# Patient Record
Sex: Male | Born: 1945 | Race: White | Hispanic: No | Smoking: Current every day smoker
Health system: Southern US, Community
[De-identification: ages and names within clinical notes are randomized; demographics above are authoritative.]

## PROBLEM LIST (undated history)

## (undated) DIAGNOSIS — N529 Male erectile dysfunction, unspecified: Secondary | ICD-10-CM

## (undated) DIAGNOSIS — N471 Phimosis: Secondary | ICD-10-CM

## (undated) DIAGNOSIS — K859 Acute pancreatitis without necrosis or infection, unspecified: Secondary | ICD-10-CM

## (undated) DIAGNOSIS — G4730 Sleep apnea, unspecified: Secondary | ICD-10-CM

## (undated) DIAGNOSIS — E669 Obesity, unspecified: Secondary | ICD-10-CM

## (undated) DIAGNOSIS — T7840XA Allergy, unspecified, initial encounter: Secondary | ICD-10-CM

## (undated) DIAGNOSIS — I2510 Atherosclerotic heart disease of native coronary artery without angina pectoris: Secondary | ICD-10-CM

## (undated) DIAGNOSIS — R350 Frequency of micturition: Secondary | ICD-10-CM

## (undated) DIAGNOSIS — E7800 Pure hypercholesterolemia, unspecified: Secondary | ICD-10-CM

## (undated) DIAGNOSIS — R3915 Urgency of urination: Secondary | ICD-10-CM

## (undated) DIAGNOSIS — M542 Cervicalgia: Secondary | ICD-10-CM

## (undated) DIAGNOSIS — K760 Fatty (change of) liver, not elsewhere classified: Secondary | ICD-10-CM

## (undated) DIAGNOSIS — N189 Chronic kidney disease, unspecified: Secondary | ICD-10-CM

## (undated) DIAGNOSIS — K219 Gastro-esophageal reflux disease without esophagitis: Secondary | ICD-10-CM

## (undated) DIAGNOSIS — C4490 Unspecified malignant neoplasm of skin, unspecified: Secondary | ICD-10-CM

## (undated) DIAGNOSIS — K5790 Diverticulosis of intestine, part unspecified, without perforation or abscess without bleeding: Secondary | ICD-10-CM

## (undated) DIAGNOSIS — I10 Essential (primary) hypertension: Secondary | ICD-10-CM

## (undated) DIAGNOSIS — N411 Chronic prostatitis: Secondary | ICD-10-CM

## (undated) DIAGNOSIS — G8929 Other chronic pain: Secondary | ICD-10-CM

## (undated) DIAGNOSIS — R519 Headache, unspecified: Secondary | ICD-10-CM

## (undated) DIAGNOSIS — M1990 Unspecified osteoarthritis, unspecified site: Secondary | ICD-10-CM

## (undated) DIAGNOSIS — R51 Headache: Secondary | ICD-10-CM

## (undated) DIAGNOSIS — N400 Enlarged prostate without lower urinary tract symptoms: Secondary | ICD-10-CM

## (undated) DIAGNOSIS — D751 Secondary polycythemia: Secondary | ICD-10-CM

## (undated) DIAGNOSIS — I739 Peripheral vascular disease, unspecified: Secondary | ICD-10-CM

## (undated) DIAGNOSIS — Z87891 Personal history of nicotine dependence: Secondary | ICD-10-CM

## (undated) DIAGNOSIS — M359 Systemic involvement of connective tissue, unspecified: Secondary | ICD-10-CM

## (undated) DIAGNOSIS — K259 Gastric ulcer, unspecified as acute or chronic, without hemorrhage or perforation: Secondary | ICD-10-CM

## (undated) DIAGNOSIS — N289 Disorder of kidney and ureter, unspecified: Secondary | ICD-10-CM

## (undated) DIAGNOSIS — E291 Testicular hypofunction: Secondary | ICD-10-CM

## (undated) DIAGNOSIS — J439 Emphysema, unspecified: Secondary | ICD-10-CM

## (undated) DIAGNOSIS — J449 Chronic obstructive pulmonary disease, unspecified: Secondary | ICD-10-CM

## (undated) HISTORY — DX: Obesity, unspecified: E66.9

## (undated) HISTORY — DX: Headache: R51

## (undated) HISTORY — DX: Chronic kidney disease, unspecified: N18.9

## (undated) HISTORY — DX: Benign prostatic hyperplasia without lower urinary tract symptoms: N40.0

## (undated) HISTORY — DX: Fatty (change of) liver, not elsewhere classified: K76.0

## (undated) HISTORY — DX: Unspecified malignant neoplasm of skin, unspecified: C44.90

## (undated) HISTORY — DX: Frequency of micturition: R35.0

## (undated) HISTORY — DX: Sleep apnea, unspecified: G47.30

## (undated) HISTORY — DX: Personal history of nicotine dependence: Z87.891

## (undated) HISTORY — DX: Chronic prostatitis: N41.1

## (undated) HISTORY — DX: Acute pancreatitis without necrosis or infection, unspecified: K85.90

## (undated) HISTORY — DX: Diverticulosis of intestine, part unspecified, without perforation or abscess without bleeding: K57.90

## (undated) HISTORY — PX: UPPER GI ENDOSCOPY: SHX6162

## (undated) HISTORY — DX: Emphysema, unspecified: J43.9

## (undated) HISTORY — DX: Phimosis: N47.1

## (undated) HISTORY — DX: Male erectile dysfunction, unspecified: N52.9

## (undated) HISTORY — DX: Headache, unspecified: R51.9

## (undated) HISTORY — DX: Gastro-esophageal reflux disease without esophagitis: K21.9

## (undated) HISTORY — DX: Secondary polycythemia: D75.1

## (undated) HISTORY — DX: Gastric ulcer, unspecified as acute or chronic, without hemorrhage or perforation: K25.9

## (undated) HISTORY — PX: CERVICAL FUSION: SHX112

## (undated) HISTORY — DX: Testicular hypofunction: E29.1

## (undated) HISTORY — DX: Urgency of urination: R39.15

## (undated) HISTORY — DX: Allergy, unspecified, initial encounter: T78.40XA

---

## 1978-07-14 HISTORY — PX: APPENDECTOMY: SHX54

## 1999-01-07 ENCOUNTER — Encounter: Admission: RE | Admit: 1999-01-07 | Discharge: 1999-04-07 | Payer: Self-pay | Admitting: Anesthesiology

## 2003-06-13 ENCOUNTER — Other Ambulatory Visit: Payer: Self-pay

## 2004-06-20 ENCOUNTER — Ambulatory Visit: Payer: Self-pay | Admitting: Internal Medicine

## 2004-07-14 HISTORY — PX: CHOLECYSTECTOMY: SHX55

## 2005-01-25 ENCOUNTER — Emergency Department: Payer: Self-pay | Admitting: Emergency Medicine

## 2005-02-25 ENCOUNTER — Ambulatory Visit: Payer: Self-pay | Admitting: Internal Medicine

## 2005-07-03 ENCOUNTER — Other Ambulatory Visit: Payer: Self-pay

## 2005-07-03 ENCOUNTER — Emergency Department: Payer: Self-pay | Admitting: Emergency Medicine

## 2006-06-24 ENCOUNTER — Emergency Department: Payer: Self-pay | Admitting: Emergency Medicine

## 2007-06-20 ENCOUNTER — Encounter: Admission: RE | Admit: 2007-06-20 | Discharge: 2007-06-20 | Payer: Self-pay

## 2007-07-27 ENCOUNTER — Ambulatory Visit (HOSPITAL_COMMUNITY): Admission: RE | Admit: 2007-07-27 | Discharge: 2007-07-27 | Payer: Self-pay | Admitting: Neurosurgery

## 2007-09-08 ENCOUNTER — Inpatient Hospital Stay (HOSPITAL_COMMUNITY): Admission: RE | Admit: 2007-09-08 | Discharge: 2007-09-09 | Payer: Self-pay | Admitting: Neurosurgery

## 2009-05-06 ENCOUNTER — Emergency Department: Payer: Self-pay | Admitting: Emergency Medicine

## 2009-05-07 ENCOUNTER — Ambulatory Visit: Payer: Self-pay | Admitting: Orthopedic Surgery

## 2009-07-14 HISTORY — PX: COLONOSCOPY: SHX174

## 2009-09-16 ENCOUNTER — Emergency Department: Payer: Self-pay | Admitting: Unknown Physician Specialty

## 2009-09-24 ENCOUNTER — Ambulatory Visit: Payer: Self-pay | Admitting: Gastroenterology

## 2009-10-30 ENCOUNTER — Emergency Department: Payer: Self-pay | Admitting: Emergency Medicine

## 2010-01-22 ENCOUNTER — Ambulatory Visit: Payer: Self-pay | Admitting: General Practice

## 2010-01-23 ENCOUNTER — Other Ambulatory Visit: Payer: Self-pay | Admitting: Internal Medicine

## 2010-01-24 ENCOUNTER — Ambulatory Visit: Payer: Self-pay | Admitting: Internal Medicine

## 2010-11-26 NOTE — Op Note (Signed)
NAME:  Colton Carter, Colton Carter NO.:  [id number]   MEDICAL RECORD NO.:  [id number]          PATIENT TYPE:  INP   LOCATION:  3035                         FACILITY:  [facility]   PHYSICIAN:  Cristi Loron, M.D.DATE OF BIRTH:  [birth year]   DATE OF PROCEDURE:  09/08/2007  DATE OF DISCHARGE:  09/09/2007                               OPERATIVE REPORT   BRIEF HISTORY:  The patient is a 65 year old white male who has suffered  from neck and arm pain.  He has failed medical management.  He was  worked up with a cervical MRI myelo-CT which demonstrated that the  patient had stenosis, spondylosis, disk degeneration at C3-C4, C4-C5,  and C5-C6.  I discussed the various treatment options with the patient  including surgery.  He is aware of the risks, benefits, and alternatives  of the surgery, so I will proceed with a C3-C4, C4-C5, and C5-C6  anterior cervical diskectomy, fusion, and plating.   PREOPERATIVE DIAGNOSES:  C3-C4, C4-C5, and C5-C6 disk degeneration,  spondylosis, stenosis, cervical radiculopathy/myelopathy, and  cervicalgia.   POSTOPERATIVE DIAGNOSES:  C3-C4, C4-C5, and C5-C6 disk degeneration,  spondylosis, stenosis, cervical radiculopathy/myelopathy, and  cervicalgia.   PROCEDURE:  C3-C4, C4-C5, and C5-C6 extensive anterior cervical  diskectomy/decompression; C3-C4, C4-C5, and C5-C6 anterior arthrodesis  with local morselized autograft bone as well as Actifuse bone graft  extender; insertion of interbody prosthesis at C3-C4, C4-C5, and C5-C6  (Alphatec PEEK interbody prosthesis) anterior cervical plating C3  through C6 with Codman Slim-Loc titanium plate end screws.   SURGEON:  Cristi Loron, M.D.   ASSISTANT:  Danae Orleans. Venetia Maxon, M.D.   ANESTHESIA:  General endotracheal.   ESTIMATED BLOOD LOSS:  100 cc.   SPECIMENS:  None.   DRAINS:  One 7-mm flat Jackson-Pratt drain in the prevertebral space.   COMPLICATIONS:  None.   DESCRIPTION OF PROCEDURE:  The  patient was brought to the operating room  by anesthesia team.  General endotracheal anesthesia was induced.  The  patient remained in supine position.  A roll was placed under the  shoulder and placed the neck in slight extension.  The anterior cervical  region was then prepared with Betadine scrub and Betadine solution.  Sterile drapes were applied.  I then injected the area to be incised  with Marcaine with epinephrine solution, used a scalpel to make a  transverse incision in the patient's left anterior neck.  I used a  Metzenbaum scissors to divide the platysma muscle and then to dissect  medial to sternocleidomastoid muscle, jugular vein, and carotid artery.  I carefully dissected down towards the anterior cervical spine  identifying the esophagus and retracting it medially.  We then used  Kitner swabs to clear the soft tissue from the anterior cervical spine  exposing the intervertebral disk spaces.  We then inserted a bent spinal  needle into the upper exposed intervertebral disk space and obtained  intraoperative radiograph to confirm our location.   We then used electrocautery to detach the medial border of the longus  colli muscle bilaterally from the C3-C4, C4-C5, and  C5-C6 interspaces.  We then inserted the Caspar self-retaining retractor underneath the  longus colli muscle bilaterally to provide exposure.  We began  decompression at C3-C4.  We incised C3-C4 intervertebral disk with 15-  blade scalpel.  We performed a partial intervertebral diskectomy using  pituitary forceps and Carlens curettes.  We inserted distraction screws  at C3-C4 distracted interspace and then brought the operative microscope  into the field, and under visual magnification and illumination, we  completed the microdissection/decompression.  We used a high-speed drill  to decorticate the vertebral endplates at C3-C4, to drill away the  remaining C3-C4 intervertebral disk, to drill away some posterior   spondylosis, and to thin out the posterior longitudinal ligament.  We  then incised the ligament with arachnoid knife and then removed it with  Kerrison punch undercutting the vertebral endplates at C3-C4,  decompressing the thecal sac.  We then performed foraminotomies about  the bilateral C4 nerve roots.  Of note, on the left side as expected,  there was some severe spondylosis.  I performed a foraminotomy about the  left C4 nerve root and removed as much of the spondylosis as I felt  safely could laterally, i.e. I was able to freely pass the black nerve  hook throughout the neural foramen, but there was still some spondylosis  laterally, but as I said I did not think I could remove  any more  spondylosis laterally without putting the vertebral artery in jeopardy,  but I was able to decompress both of the nerves well.   We then repeated this procedure in an analogous fashion at C4-C5, C5-C6  decompressing the C4, C5, and C6 thecal sac and bilateral C5 and C6  nerve roots, completing the decompression.   Having completed the decompression, we now turned our attention to the  arthrodesis.  We used trial spacers and determined to use 6-mm medium  PEEK interbody prosthesis.  We pre-filled this prosthesis with a  combination of local autograft bone.  We obtained during the  decompression as well as Actifuse bone graft extender.  We inserted the  prosthesis, and distracted C3-C4, C4-C5, and C5-C6 interspaces, then  removed distraction screws.  There was good snug fit of the prostheses  at each level.   We now turned our attention to the anterior spinal instrumentation.  We  used a high-speed drill to remove some ventral spondylosis from the  vertebral endplates at C3-C4, C4-C5, and C5-C6 and then plate laid down  flat.  We then selected appropriate-length Codman Slim-Loc anterior  cervical plate and laid along the anterior aspect of the intravertebral  bodies from C3 through C6.  We then  drilled two 14-mm holes at C3, C4,  C5, and C6 and secured the plate to the vertebral bodies by placing two  14-mm self-tapping screws at C3, C4, C5, and C6.  We obtained  intraoperative radiograph.  It demonstrated good position of the plate,  screws, and interbody prostheses at C3-C4, we really could not see C4-  C5, C5-C6 very well on the radiograph because of the patient's  shoulders, but they looked good in vivo.  We, therefore, secured these  screws and plate by locking each cam.   We then irrigated the wound out with bacitracin solution, obtained  stringent hemostasis using bipolar electrocautery.  We placed a 7-mm  flat Jackson-Pratt drain in the prevertebral space and tunneled out  through separate stab wound.  We then removed the retractors and  inspected the esophagus for  any damage, there was none apparent.  We  then reapproximated the patient's platysma muscle with interrupted 3-0  Vicryl suture, subcutaneous tissue with interrupted 3-0 Vicryl suture,  and the skin with Steri-Strips and Benzoin.  The wound was then coated  with  bacitracin ointment.  Sterile dressing was applied.  The drapes were  removed.  The patient was subsequently extubated by the anesthesia team  and transported to post anesthesia care unit in stable condition.  All  sponge, instrument, and needle counts were correct at the end of this  case.      Cristi Loron, M.D.  Electronically Signed     JDJ/MEDQ  D:  09/09/2007  T:  09/10/2007  Job:  [id number]

## 2011-03-08 ENCOUNTER — Emergency Department: Payer: Self-pay | Admitting: Emergency Medicine

## 2011-04-04 LAB — CBC
HCT: 46
RDW: 13.4

## 2011-04-04 LAB — COMPREHENSIVE METABOLIC PANEL
ALT: 35
Albumin: 4.3
Alkaline Phosphatase: 36 — ABNORMAL LOW
Creatinine, Ser: 1.06
GFR calc Af Amer: >60
GFR calc non Af Amer: >60
Glucose, Bld: 66 — ABNORMAL LOW
Sodium: 142

## 2011-07-14 ENCOUNTER — Inpatient Hospital Stay: Payer: Self-pay | Admitting: Internal Medicine

## 2011-07-15 LAB — BASIC METABOLIC PANEL
BUN: 44 mg/dL — ABNORMAL HIGH (ref 7–18)
Co2: 22 mmol/L (ref 21–32)
Creatinine: 2 mg/dL — ABNORMAL HIGH (ref 0.60–1.30)
EGFR (African American): 44 — ABNORMAL LOW
EGFR (Non-African Amer.): 36 — ABNORMAL LOW
Glucose: 208 mg/dL — ABNORMAL HIGH (ref 65–99)
Sodium: 141 mmol/L (ref 136–145)

## 2011-07-15 LAB — CBC WITH DIFFERENTIAL/PLATELET
Eosinophil %: 2.8 %
HCT: 32.3 % — ABNORMAL LOW (ref 40.0–52.0)
HGB: 10.9 g/dL — ABNORMAL LOW (ref 13.0–18.0)
Lymphocyte #: 1.2 10*3/uL (ref 1.0–3.6)
Lymphocyte %: 17.6 %
MCV: 97 fL (ref 80–100)
Monocyte %: 9.7 %
Platelet: 248 10*3/uL (ref 150–440)

## 2011-07-15 LAB — LIPID PANEL
Cholesterol: 92 mg/dL (ref 0–200)
HDL Cholesterol: 20 mg/dL — ABNORMAL LOW (ref 40–60)

## 2011-07-15 LAB — CK TOTAL AND CKMB (NOT AT ARMC)
CK, Total: 66 U/L (ref 35–232)
CK, Total: 58 U/L (ref 35–232)
CK-MB: 1.7 ng/mL (ref 0.5–3.6)
CK-MB: 1.5 ng/mL (ref 0.5–3.6)

## 2011-07-15 LAB — HEMOGLOBIN A1C: Hemoglobin A1C: 8.4 % — ABNORMAL HIGH (ref 4.2–6.3)

## 2011-07-15 LAB — TROPONIN I: Troponin-I: <0.02 ng/mL

## 2011-11-23 ENCOUNTER — Emergency Department: Payer: Self-pay | Admitting: Emergency Medicine

## 2011-11-23 LAB — URINALYSIS, COMPLETE
Blood: NEGATIVE
Ketone: NEGATIVE
Leukocyte Esterase: NEGATIVE
Nitrite: NEGATIVE
Ph: 5 (ref 4.5–8.0)
Specific Gravity: 1.008 (ref 1.003–1.030)
Squamous Epithelial: <1
WBC UR: <1 /HPF (ref 0–5)

## 2011-11-23 LAB — CBC
HGB: 14.5 g/dL (ref 13.0–18.0)
MCH: 31.9 pg (ref 26.0–34.0)
MCHC: 34.5 g/dL (ref 32.0–36.0)
MCV: 93 fL (ref 80–100)
RBC: 4.54 10*6/uL (ref 4.40–5.90)
RDW: 16.2 % — ABNORMAL HIGH (ref 11.5–14.5)

## 2011-11-23 LAB — BASIC METABOLIC PANEL
Anion Gap: 5 — ABNORMAL LOW (ref 7–16)
BUN: 46 mg/dL — ABNORMAL HIGH (ref 7–18)
Chloride: 102 mmol/L (ref 98–107)
Co2: 28 mmol/L (ref 21–32)
EGFR (African American): 42 — ABNORMAL LOW
EGFR (Non-African Amer.): 36 — ABNORMAL LOW
Osmolality: 289 (ref 275–301)

## 2011-11-27 ENCOUNTER — Ambulatory Visit: Payer: Self-pay | Admitting: Urology

## 2011-12-30 ENCOUNTER — Ambulatory Visit: Payer: Self-pay | Admitting: Urology

## 2012-03-29 ENCOUNTER — Emergency Department: Payer: Self-pay | Admitting: Emergency Medicine

## 2012-04-12 ENCOUNTER — Emergency Department (HOSPITAL_COMMUNITY): Payer: Medicare Other

## 2012-04-12 ENCOUNTER — Inpatient Hospital Stay (HOSPITAL_COMMUNITY)
Admission: EM | Admit: 2012-04-12 | Discharge: 2012-04-20 | DRG: 183 | Disposition: A | Discharge status: Home / Self Care | Payer: Medicare Other | Attending: General Surgery | Admitting: General Surgery

## 2012-04-12 ENCOUNTER — Encounter (HOSPITAL_COMMUNITY): Payer: Self-pay | Admitting: *Deleted

## 2012-04-12 DIAGNOSIS — E119 Type 2 diabetes mellitus without complications: Secondary | ICD-10-CM | POA: Diagnosis present

## 2012-04-12 DIAGNOSIS — R0989 Other specified symptoms and signs involving the circulatory and respiratory systems: Secondary | ICD-10-CM | POA: Diagnosis present

## 2012-04-12 DIAGNOSIS — N179 Acute kidney failure, unspecified: Secondary | ICD-10-CM | POA: Diagnosis not present

## 2012-04-12 DIAGNOSIS — D62 Acute posthemorrhagic anemia: Secondary | ICD-10-CM | POA: Diagnosis not present

## 2012-04-12 DIAGNOSIS — J189 Pneumonia, unspecified organism: Secondary | ICD-10-CM | POA: Diagnosis not present

## 2012-04-12 DIAGNOSIS — Z794 Long term (current) use of insulin: Secondary | ICD-10-CM

## 2012-04-12 DIAGNOSIS — S80212A Abrasion, left knee, initial encounter: Secondary | ICD-10-CM | POA: Diagnosis present

## 2012-04-12 DIAGNOSIS — G8929 Other chronic pain: Secondary | ICD-10-CM | POA: Diagnosis present

## 2012-04-12 DIAGNOSIS — M109 Gout, unspecified: Secondary | ICD-10-CM | POA: Diagnosis present

## 2012-04-12 DIAGNOSIS — N183 Chronic kidney disease, stage 3 unspecified: Secondary | ICD-10-CM | POA: Diagnosis present

## 2012-04-12 DIAGNOSIS — F17200 Nicotine dependence, unspecified, uncomplicated: Secondary | ICD-10-CM | POA: Diagnosis present

## 2012-04-12 DIAGNOSIS — J449 Chronic obstructive pulmonary disease, unspecified: Secondary | ICD-10-CM | POA: Diagnosis present

## 2012-04-12 DIAGNOSIS — I2510 Atherosclerotic heart disease of native coronary artery without angina pectoris: Secondary | ICD-10-CM | POA: Diagnosis present

## 2012-04-12 DIAGNOSIS — W11XXXA Fall on and from ladder, initial encounter: Secondary | ICD-10-CM | POA: Diagnosis present

## 2012-04-12 DIAGNOSIS — S2249XA Multiple fractures of ribs, unspecified side, initial encounter for closed fracture: Principal | ICD-10-CM | POA: Diagnosis present

## 2012-04-12 DIAGNOSIS — I129 Hypertensive chronic kidney disease with stage 1 through stage 4 chronic kidney disease, or unspecified chronic kidney disease: Secondary | ICD-10-CM | POA: Diagnosis present

## 2012-04-12 DIAGNOSIS — IMO0002 Reserved for concepts with insufficient information to code with codable children: Secondary | ICD-10-CM | POA: Diagnosis present

## 2012-04-12 DIAGNOSIS — M79609 Pain in unspecified limb: Secondary | ICD-10-CM | POA: Diagnosis present

## 2012-04-12 DIAGNOSIS — J4489 Other specified chronic obstructive pulmonary disease: Secondary | ICD-10-CM | POA: Diagnosis present

## 2012-04-12 DIAGNOSIS — S2241XA Multiple fractures of ribs, right side, initial encounter for closed fracture: Secondary | ICD-10-CM | POA: Diagnosis present

## 2012-04-12 DIAGNOSIS — R0609 Other forms of dyspnea: Secondary | ICD-10-CM | POA: Diagnosis present

## 2012-04-12 DIAGNOSIS — R000 Tachycardia, unspecified: Secondary | ICD-10-CM | POA: Diagnosis not present

## 2012-04-12 DIAGNOSIS — F411 Generalized anxiety disorder: Secondary | ICD-10-CM | POA: Diagnosis present

## 2012-04-12 HISTORY — DX: Essential (primary) hypertension: I10

## 2012-04-12 HISTORY — DX: Atherosclerotic heart disease of native coronary artery without angina pectoris: I25.10

## 2012-04-12 HISTORY — DX: Other chronic pain: G89.29

## 2012-04-12 HISTORY — DX: Cervicalgia: M54.2

## 2012-04-12 HISTORY — DX: Disorder of kidney and ureter, unspecified: N28.9

## 2012-04-12 HISTORY — DX: Pure hypercholesterolemia, unspecified: E78.00

## 2012-04-12 HISTORY — DX: Unspecified osteoarthritis, unspecified site: M19.90

## 2012-04-12 LAB — CBC WITH DIFFERENTIAL/PLATELET
Eosinophils Absolute: 0.2 10*3/uL (ref 0.0–0.7)
Eosinophils Relative: 3 % (ref 0–5)
Hemoglobin: 13.4 g/dL (ref 13.0–17.0)
Lymphocytes Relative: 13 % (ref 12–46)
Neutro Abs: 5.9 10*3/uL (ref 1.7–7.7)
Platelets: 130 10*3/uL — ABNORMAL LOW (ref 150–400)
RDW: 15.7 % — ABNORMAL HIGH (ref 11.5–15.5)
WBC: 7.7 10*3/uL (ref 4.0–10.5)

## 2012-04-12 LAB — CBC
HCT: 40.5 % (ref 39.0–52.0)
MCHC: 33.6 g/dL (ref 30.0–36.0)
Platelets: 114 10*3/uL — ABNORMAL LOW (ref 150–400)
RDW: 15.8 % — ABNORMAL HIGH (ref 11.5–15.5)
WBC: 8.5 10*3/uL (ref 4.0–10.5)

## 2012-04-12 LAB — POCT I-STAT, CHEM 8
Calcium, Ion: 1.1 mmol/L — ABNORMAL LOW (ref 1.13–1.30)
Chloride: 102 mEq/L (ref 96–112)
Creatinine, Ser: 1.8 mg/dL — ABNORMAL HIGH (ref 0.50–1.35)
Potassium: 4.3 mEq/L (ref 3.5–5.1)

## 2012-04-12 LAB — URINALYSIS, ROUTINE W REFLEX MICROSCOPIC
Bilirubin Urine: NEGATIVE
Ketones, ur: NEGATIVE mg/dL
Nitrite: NEGATIVE
Urobilinogen, UA: 1 mg/dL (ref 0.0–1.0)

## 2012-04-12 LAB — CREATININE, SERUM
Creatinine, Ser: 1.69 mg/dL — ABNORMAL HIGH (ref 0.50–1.35)
GFR calc Af Amer: 47 mL/min — ABNORMAL LOW (ref >90)
GFR calc non Af Amer: 41 mL/min — ABNORMAL LOW (ref >90)

## 2012-04-12 MED ORDER — HYDROMORPHONE HCL PF 1 MG/ML IJ SOLN
1.0000 mg | Freq: Once | INTRAMUSCULAR | Status: AC
  Administered 2012-04-12: 1 mg via INTRAVENOUS
  Filled 2012-04-12: qty 1

## 2012-04-12 MED ORDER — ONDANSETRON HCL 4 MG/2ML IJ SOLN
4.0000 mg | Freq: Once | INTRAMUSCULAR | Status: AC
  Administered 2012-04-12: 4 mg via INTRAVENOUS
  Filled 2012-04-12: qty 2

## 2012-04-12 MED ORDER — SODIUM CHLORIDE 0.9 % IV SOLN: 250.0000 mL | INTRAVENOUS | Status: DC | PRN

## 2012-04-12 MED ORDER — LISINOPRIL-HYDROCHLOROTHIAZIDE 20-25 MG PO TABS: 1.0000 | ORAL_TABLET | Freq: Every day | ORAL | Status: DC

## 2012-04-12 MED ORDER — ONDANSETRON HCL 4 MG PO TABS: 4.0000 mg | ORAL_TABLET | Freq: Four times a day (QID) | ORAL | Status: DC | PRN

## 2012-04-12 MED ORDER — BISACODYL 10 MG RE SUPP: 10.0000 mg | Freq: Every day | RECTAL | Status: DC | PRN

## 2012-04-12 MED ORDER — FUROSEMIDE 40 MG PO TABS
40.0000 mg | ORAL_TABLET | Freq: Two times a day (BID) | ORAL | Status: DC
  Administered 2012-04-13 – 2012-04-15 (×4): 40 mg via ORAL
  Filled 2012-04-12 (×10): qty 1

## 2012-04-12 MED ORDER — SODIUM CHLORIDE 0.9 % IJ SOLN
3.0000 mL | Freq: Two times a day (BID) | INTRAMUSCULAR | Status: DC
  Administered 2012-04-13 – 2012-04-19 (×9): 3 mL via INTRAVENOUS

## 2012-04-12 MED ORDER — CYCLOBENZAPRINE HCL 10 MG PO TABS
10.0000 mg | ORAL_TABLET | Freq: Two times a day (BID) | ORAL | Status: DC
  Administered 2012-04-12 – 2012-04-18 (×13): 10 mg via ORAL
  Filled 2012-04-12 (×20): qty 1

## 2012-04-12 MED ORDER — HYDROCODONE-ACETAMINOPHEN 10-325 MG PO TABS
1.0000 | ORAL_TABLET | ORAL | Status: DC | PRN
  Administered 2012-04-12 – 2012-04-14 (×7): 2 via ORAL
  Filled 2012-04-12 (×7): qty 2

## 2012-04-12 MED ORDER — ONDANSETRON HCL 4 MG/2ML IJ SOLN
4.0000 mg | Freq: Four times a day (QID) | INTRAMUSCULAR | Status: DC | PRN
  Administered 2012-04-15: 4 mg via INTRAVENOUS
  Filled 2012-04-12: qty 2

## 2012-04-12 MED ORDER — SODIUM CHLORIDE 0.9 % IJ SOLN: 3.0000 mL | INTRAMUSCULAR | Status: DC | PRN

## 2012-04-12 MED ORDER — MELOXICAM 15 MG PO TABS
15.0000 mg | ORAL_TABLET | Freq: Every day | ORAL | Status: DC
  Administered 2012-04-13 – 2012-04-15 (×3): 15 mg via ORAL
  Filled 2012-04-12 (×3): qty 1

## 2012-04-12 MED ORDER — FEBUXOSTAT 40 MG PO TABS
40.0000 mg | ORAL_TABLET | ORAL | Status: DC
  Administered 2012-04-13 – 2012-04-19 (×4): 40 mg via ORAL
  Filled 2012-04-12 (×4): qty 1

## 2012-04-12 MED ORDER — HYDROCHLOROTHIAZIDE 25 MG PO TABS
25.0000 mg | ORAL_TABLET | Freq: Every day | ORAL | Status: DC
  Administered 2012-04-13 – 2012-04-15 (×3): 25 mg via ORAL
  Filled 2012-04-12 (×4): qty 1

## 2012-04-12 MED ORDER — ENOXAPARIN SODIUM 30 MG/0.3ML ~~LOC~~ SOLN
30.0000 mg | SUBCUTANEOUS | Status: DC
  Administered 2012-04-12 – 2012-04-18 (×7): 30 mg via SUBCUTANEOUS
  Filled 2012-04-12 (×8): qty 0.3

## 2012-04-12 MED ORDER — HYDROMORPHONE HCL PF 1 MG/ML IJ SOLN: 1.0000 mg | INTRAMUSCULAR | Status: DC | PRN

## 2012-04-12 MED ORDER — ONDANSETRON HCL 4 MG/2ML IJ SOLN: 4.0000 mg | Freq: Three times a day (TID) | INTRAMUSCULAR | Status: AC | PRN

## 2012-04-12 MED ORDER — SODIUM CHLORIDE 0.9 % IV SOLN
INTRAVENOUS | Status: DC
  Administered 2012-04-12: 1000 mL via INTRAVENOUS

## 2012-04-12 MED ORDER — INSULIN ASPART 100 UNIT/ML ~~LOC~~ SOLN: 5.0000 [IU] | Freq: Three times a day (TID) | SUBCUTANEOUS | Status: DC

## 2012-04-12 MED ORDER — GABAPENTIN 600 MG PO TABS
600.0000 mg | ORAL_TABLET | Freq: Three times a day (TID) | ORAL | Status: DC
  Administered 2012-04-12 – 2012-04-15 (×10): 600 mg via ORAL
  Filled 2012-04-12 (×16): qty 1

## 2012-04-12 MED ORDER — SIMVASTATIN 40 MG PO TABS
40.0000 mg | ORAL_TABLET | Freq: Every day | ORAL | Status: DC
  Administered 2012-04-13 – 2012-04-19 (×7): 40 mg via ORAL
  Filled 2012-04-12 (×9): qty 1

## 2012-04-12 MED ORDER — MORPHINE SULFATE 4 MG/ML IJ SOLN
4.0000 mg | Freq: Once | INTRAMUSCULAR | Status: AC
  Administered 2012-04-12: 4 mg via INTRAVENOUS
  Filled 2012-04-12: qty 1

## 2012-04-12 MED ORDER — ALPRAZOLAM 0.25 MG PO TABS
0.2500 mg | ORAL_TABLET | Freq: Three times a day (TID) | ORAL | Status: DC
  Administered 2012-04-12 – 2012-04-14 (×7): 0.25 mg via ORAL
  Administered 2012-04-15 – 2012-04-16 (×4): 0.5 mg via ORAL
  Administered 2012-04-16: 0.25 mg via ORAL
  Administered 2012-04-17 – 2012-04-18 (×4): 0.5 mg via ORAL
  Administered 2012-04-18 – 2012-04-19 (×3): 0.25 mg via ORAL
  Administered 2012-04-19: 0.5 mg via ORAL
  Administered 2012-04-19: 0.25 mg via ORAL
  Filled 2012-04-12: qty 2
  Filled 2012-04-12: qty 1
  Filled 2012-04-12: qty 2
  Filled 2012-04-12 (×2): qty 1
  Filled 2012-04-12 (×2): qty 2
  Filled 2012-04-12: qty 1
  Filled 2012-04-12: qty 2
  Filled 2012-04-12: qty 1
  Filled 2012-04-12: qty 2
  Filled 2012-04-12: qty 1
  Filled 2012-04-12: qty 2
  Filled 2012-04-12 (×2): qty 1
  Filled 2012-04-12: qty 2
  Filled 2012-04-12 (×4): qty 1
  Filled 2012-04-12: qty 2

## 2012-04-12 MED ORDER — TIOTROPIUM BROMIDE MONOHYDRATE 18 MCG IN CAPS
18.0000 ug | ORAL_CAPSULE | Freq: Every day | RESPIRATORY_TRACT | Status: DC
  Administered 2012-04-14 – 2012-04-15 (×2): 18 ug via RESPIRATORY_TRACT
  Filled 2012-04-12 (×3): qty 5

## 2012-04-12 MED ORDER — MONTELUKAST SODIUM 10 MG PO TABS
10.0000 mg | ORAL_TABLET | Freq: Every day | ORAL | Status: DC
  Administered 2012-04-13 – 2012-04-19 (×7): 10 mg via ORAL
  Filled 2012-04-12 (×9): qty 1

## 2012-04-12 MED ORDER — TESTOSTERONE CYPIONATE 200 MG/ML IM SOLN: 200.0000 mg | INTRAMUSCULAR | Status: DC

## 2012-04-12 MED ORDER — PANTOPRAZOLE SODIUM 40 MG PO TBEC
40.0000 mg | DELAYED_RELEASE_TABLET | Freq: Every day | ORAL | Status: DC
  Administered 2012-04-13 – 2012-04-19 (×7): 40 mg via ORAL
  Filled 2012-04-12 (×7): qty 1

## 2012-04-12 MED ORDER — INSULIN GLARGINE 100 UNIT/ML ~~LOC~~ SOLN
50.0000 [IU] | Freq: Two times a day (BID) | SUBCUTANEOUS | Status: DC
Start: 2012-04-12 — End: 2012-04-13
  Administered 2012-04-12: 50 [IU] via SUBCUTANEOUS

## 2012-04-12 MED ORDER — ATENOLOL 25 MG PO TABS
25.0000 mg | ORAL_TABLET | Freq: Every day | ORAL | Status: DC
  Administered 2012-04-13 – 2012-04-19 (×5): 25 mg via ORAL
  Filled 2012-04-12 (×9): qty 1

## 2012-04-12 MED ORDER — HYDROMORPHONE HCL PF 1 MG/ML IJ SOLN: 0.5000 mg | INTRAMUSCULAR | Status: DC | PRN

## 2012-04-12 MED ORDER — LISINOPRIL 20 MG PO TABS
20.0000 mg | ORAL_TABLET | Freq: Every day | ORAL | Status: DC
  Administered 2012-04-13 – 2012-04-15 (×2): 20 mg via ORAL
  Filled 2012-04-12 (×4): qty 1

## 2012-04-12 MED ORDER — DOCUSATE SODIUM 100 MG PO CAPS
100.0000 mg | ORAL_CAPSULE | Freq: Two times a day (BID) | ORAL | Status: DC
  Administered 2012-04-12 – 2012-04-18 (×13): 100 mg via ORAL
  Filled 2012-04-12 (×15): qty 1

## 2012-04-12 MED ORDER — IOHEXOL 300 MG/ML  SOLN
80.0000 mL | Freq: Once | INTRAMUSCULAR | Status: AC | PRN
  Administered 2012-04-12: 80 mL via INTRAVENOUS

## 2012-04-12 MED ORDER — FEBUXOSTAT 40 MG PO TABS
80.0000 mg | ORAL_TABLET | ORAL | Status: DC
  Administered 2012-04-14 – 2012-04-18 (×3): 80 mg via ORAL
  Filled 2012-04-12 (×4): qty 2

## 2012-04-12 MED ORDER — FLUTICASONE-SALMETEROL 250-50 MCG/DOSE IN AEPB
1.0000 | INHALATION_SPRAY | Freq: Two times a day (BID) | RESPIRATORY_TRACT | Status: DC
  Administered 2012-04-13 – 2012-04-20 (×13): 1 via RESPIRATORY_TRACT
  Filled 2012-04-12 (×3): qty 14

## 2012-04-12 NOTE — ED Notes (Signed)
Patient transported to CT 

## 2012-04-12 NOTE — ED Provider Notes (Signed)
History     CSN: [id number]  Arrival date & time 04/12/12  1431   First MD Initiated Contact with Patient 04/12/12 1531      Chief Complaint  Patient presents with  . Fall     HPI Pt fell approx 5 feet from ladder then fell again into an empty pool approx 6 feet. Denies LOC. C/o right rib, RUE, RLE, right hip & RUQ pain.  Denies striking his head.  Denies abdominal pain.  Past Medical History  Diagnosis Date  . Coronary artery disease   . Diabetes mellitus   . Hypertension   . Arthritis   . High cholesterol   . Renal disorder   . Chronic neck pain     Past Surgical History  Procedure Date  . Cervical fusion     History reviewed. No pertinent family history.  History  Substance Use Topics  . Smoking status: Current Every Day Smoker -- 0.2 packs/day    Types: Cigarettes  . Smokeless tobacco: Not on file  . Alcohol Use: Yes     2 drinks per week on occasion      Review of Systems All other systems are negative Allergies  Review of patient's allergies indicates no known allergies.  Home Medications   No current outpatient prescriptions on file.  BP 97/59  Pulse 88  Temp 98.5 F (36.9 C) (Oral)  Resp 20  Ht 5\' 7"  (1.702 m)  Wt 205 lb (92.987 kg)  BMI 32.11 kg/m2  SpO2 95%  Physical Exam  Nursing note and vitals reviewed. Constitutional: He is oriented to person, place, and time. He appears well-developed. No distress.  HENT:  Head: Normocephalic and atraumatic.  Eyes: Pupils are equal, round, and reactive to light.  Neck: Normal range of motion.  Cardiovascular: Normal rate and intact distal pulses.   Pulmonary/Chest: No respiratory distress. He exhibits tenderness. He exhibits no crepitus.    Abdominal: Normal appearance. He exhibits no distension.  Musculoskeletal: Normal range of motion.       Left knee: He exhibits no swelling and no laceration.       Back:       Legs: Neurological: He is alert and oriented to person, place, and time.  No cranial nerve deficit.  Skin: Skin is warm and dry. No rash noted.  Psychiatric: He has a normal mood and affect. His behavior is normal.    ED Course  Procedures (including critical care time)     Labs Reviewed  CBC WITH DIFFERENTIAL - Abnormal; Notable for the following:    RBC 4.06 (*)     RDW 15.7 (*)     Platelets 130 (*)     All other components within normal limits  POCT I-STAT, CHEM 8 - Abnormal; Notable for the following:    BUN 30 (*)     Creatinine, Ser 1.80 (*)     Calcium, Ion 1.10 (*)     All other components within normal limits  CBC - Abnormal; Notable for the following:    RBC 4.18 (*)     RDW 15.8 (*)     Platelets 114 (*)  PLATELET COUNT CONFIRMED BY SMEAR   All other components within normal limits  CREATININE, SERUM - Abnormal; Notable for the following:    Creatinine, Ser 1.69 (*)     GFR calc non Af Amer 41 (*)     GFR calc Af Amer 47 (*)     All other components within normal limits  CBC - Abnormal; Notable for the following:    RBC 3.93 (*)     Hemoglobin 12.8 (*)     HCT 37.8 (*)     RDW 15.7 (*)     Platelets 101 (*)  CONSISTENT WITH PREVIOUS RESULT   All other components within normal limits  BASIC METABOLIC PANEL - Abnormal; Notable for the following:    Glucose, Bld 134 (*)     BUN 27 (*)     Creatinine, Ser 1.74 (*)     Calcium 8.3 (*)     GFR calc non Af Amer 39 (*)     GFR calc Af Amer 46 (*)     All other components within normal limits  GLUCOSE, CAPILLARY - Abnormal; Notable for the following:    Glucose-Capillary 180 (*)     All other components within normal limits  GLUCOSE, CAPILLARY - Abnormal; Notable for the following:    Glucose-Capillary 122 (*)     All other components within normal limits  GLUCOSE, CAPILLARY - Abnormal; Notable for the following:    Glucose-Capillary 285 (*)     All other components within normal limits  GLUCOSE, CAPILLARY - Abnormal; Notable for the following:    Glucose-Capillary 199 (*)      All other components within normal limits  GLUCOSE, CAPILLARY - Abnormal; Notable for the following:    Glucose-Capillary 159 (*)     All other components within normal limits  URINALYSIS, ROUTINE W REFLEX MICROSCOPIC  MRSA PCR SCREENING  MRSA PCR SCREENING  CBC  BASIC METABOLIC PANEL   Dg Thoracic Spine 2 View  04/12/2012  *RADIOLOGY REPORT*  Clinical Data: Back pain secondary to a fall.  THORACIC SPINE - 2 VIEW  Comparison: Chest x-ray dated 04/12/2012  Findings: There is no appreciable fracture, subluxation, or bone destruction.  Minimal scoliosis in the mid thoracic spine.  IMPRESSION: No visible acute abnormality.  The thoracic spine will be better seen on chest CT to follow.   Original Report Authenticated By: Gwynn Burly, M.D.    Dg Lumbar Spine Complete  04/12/2012  *RADIOLOGY REPORT*  Clinical Data: Trauma.  Fall on the right side. Back pain  LUMBAR SPINE - COMPLETE 4+ VIEW  Comparison: None.  Findings: No evidence for fracture.  No subluxation. Intervertebral disc spaces are preserved.  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  SI joints are normal bilaterally.  IMPRESSION: No acute bony findings.   Original Report Authenticated By: ERIC A. MANSELL, M.D.    Ct Chest W Contrast  04/12/2012  *RADIOLOGY REPORT*  Clinical Data:  5 feet fall onto concrete injury the right side with rib and hip pain.  Upper abdominal pain.  CT CHEST, ABDOMEN AND PELVIS WITH CONTRAST  Technique:  Multidetector CT imaging of the chest, abdomen and pelvis was performed following the standard protocol during bolus administration of intravenous contrast.  Contrast: 80mL OMNIPAQUE IOHEXOL 300 MG/ML  SOLN  Comparison:   None.  CT CHEST  Findings:  There is no axillary, mediastinal, or hilar lymphadenopathy.  Heart size is normal.  There is no pericardial or pleural effusion.  Lung windows show no evidence for pneumothorax.  There is some dependent atelectasis in the posterior lungs bilaterally.  No focal  airspace consolidation or evidence of pulmonary contusion.  4 mm pulmonary nodule is seen in the left lower lobe on image 29.  Bone windows show acute fractures of the lateral right fourth through eighth ribs.  IMPRESSION: Acute fractures of the  lateral right fourth through eighth ribs. No evidence for segmental rib fracture.  No underlying pneumothorax or pleural effusion.  4 mm left lower lobe pulmonary nodule. If the patient is at high risk for bronchogenic carcinoma, follow-up chest CT at 1 year is recommended.  If the patient is at low risk, no follow-up is needed.  This recommendation follows the consensus statement: Guidelines for Management of Small Pulmonary Nodules Detected on CT Scans:  A Statement from the Fleischner Society as published in Radiology 2005; 237:395-400.  CT ABDOMEN AND PELVIS  Findings:  The liver, spleen, stomach, duodenum, pancreas, adrenal glands, and kidneys are unremarkable.  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  No abdominal aortic aneurysm.  No free fluid or lymphadenopathy in the abdomen.  Imaging through the pelvis shows no free intraperitoneal fluid. The bladder is markedly distended soft tissue band extending from the bladder dome to the umbilicus is consistent with the presence of a ureteral remnant.  Prostate gland is mildly enlarged with central dystrophic calcification.  There is no pelvic sidewall lymphadenopathy.  Bilateral inguinal hernias contain only fat.  Diverticular changes are seen in the sigmoid colon without diverticulitis.  The terminal ileum is unremarkable. The appendix is not visualized, but there is no edema or inflammation in the region of the cecum.  No evidence for fracture in the bony pelvis or lumbar spine.  IMPRESSION: No acute traumatic organ injury in the abdomen or pelvis.  No intraperitoneal free fluid.   Original Report Authenticated By: ERIC A. MANSELL, M.D.    Ct Cervical  Spine Wo Contrast  04/12/2012  *RADIOLOGY REPORT*  Clinical Data: The patient fell 5 feet from ladder onto concrete. Neck pain.  CT CERVICAL SPINE WITHOUT CONTRAST  Technique:  Multidetector CT imaging of the cervical spine was performed. Multiplanar CT image reconstructions were also generated.  Comparison: 07/27/2007  Findings: Imaging was obtained from the skull base through the T2 vertebral body.  The patient is status post extensive cervical fusion with ACDF at C3-4, C4-5, C5-6 with plating.  There is complete loss of disc height at C6-7 and the C6 screw crosses the anterior portion of the C6-7 interspace.  Straightening of the normal cervical lordosis is evident.  No evidence of an acute fracture.  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  The patient is also noted be fused posteriorly at C6-7.  Degenerative disc and endplate disease is seen at C7-T1.  The facets are well-aligned bilaterally with degenerative changes at multiple levels.  The left C3-4 facets are fused.  No evidence for prevertebral soft tissue swelling.  IMPRESSION: Status post ACDF with anterior plating from C3-C6.  The patient is also fused across the C6-7 interspace posteriorly.  No evidence for an acute fracture on the current exam.   Original Report Authenticated By: ERIC A. MANSELL, M.D.    Ct Abdomen Pelvis W Contrast  04/12/2012  *RADIOLOGY REPORT*  Clinical Data:  5 feet fall onto concrete injury the right side with rib and hip pain.  Upper abdominal pain.  CT CHEST, ABDOMEN AND PELVIS WITH CONTRAST  Technique:  Multidetector CT imaging of the chest, abdomen and pelvis was performed following the standard protocol during bolus administration of intravenous contrast.  Contrast: 80mL OMNIPAQUE IOHEXOL 300 MG/ML  SOLN  Comparison:   None.  CT CHEST  Findings:  There is no axillary, mediastinal, or hilar lymphadenopathy.  Heart  size is normal.  There is no pericardial or pleural effusion.  Lung  windows show no evidence for pneumothorax.  There is some dependent atelectasis in the posterior lungs bilaterally.  No focal airspace consolidation or evidence of pulmonary contusion.  4 mm pulmonary nodule is seen in the left lower lobe on image 29.  Bone windows show acute fractures of the lateral right fourth through eighth ribs.  IMPRESSION: Acute fractures of the lateral right fourth through eighth ribs. No evidence for segmental rib fracture.  No underlying pneumothorax or pleural effusion.  4 mm left lower lobe pulmonary nodule. If the patient is at high risk for bronchogenic carcinoma, follow-up chest CT at 1 year is recommended.  If the patient is at low risk, no follow-up is needed.  This recommendation follows the consensus statement: Guidelines for Management of Small Pulmonary Nodules Detected on CT Scans:  A Statement from the Fleischner Society as published in Radiology 2005; 237:395-400.  CT ABDOMEN AND PELVIS  Findings:  The liver, spleen, stomach, duodenum, pancreas, adrenal glands, and kidneys are unremarkable.  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  No abdominal aortic aneurysm.  No free fluid or lymphadenopathy in the abdomen.  Imaging through the pelvis shows no free intraperitoneal fluid. The bladder is markedly distended soft tissue band extending from the bladder dome to the umbilicus is consistent with the presence of a ureteral remnant.  Prostate gland is mildly enlarged with central dystrophic calcification.  There is no pelvic sidewall lymphadenopathy.  Bilateral inguinal hernias contain only fat.  Diverticular changes are seen in the sigmoid colon without diverticulitis.  The terminal ileum is unremarkable. The appendix is not visualized, but there is no edema or inflammation in the region of the cecum.  No evidence for fracture in the bony pelvis or lumbar spine.  IMPRESSION: No acute traumatic organ injury in the  abdomen or pelvis.  No intraperitoneal free fluid.   Original Report Authenticated By: ERIC A. MANSELL, M.D.    Dg Pelvis Portable  04/12/2012  *RADIOLOGY REPORT*  Clinical Data: Post fall, now with diffuse back pain, particularly on the right side.  PORTABLE PELVIS  Comparison: None.  Findings:  Examination is degraded secondary to patient positioning and exclusion of the right lateral aspect of the ilium and the proximal aspect of the right femur.  No definite pelvic fracture.  The regional soft tissues are normal. No radiopaque foreign body.  IMPRESSION: Degraded examination without definite acute finding.   Original Report Authenticated By: Waynard Reeds, M.D.    Dg Chest Portable 1 View  04/12/2012  *RADIOLOGY REPORT*  Clinical Data: Fall.  Pain in back and right side.  PORTABLE CHEST - 1 VIEW  Comparison: None.  Findings: Cardiac leads project over the chest.  Plate and screw fixation of the lower cervical spine is visualized.  Lung volumes are low bilaterally.  Heart and mediastinal contours appear within normal limits for portable technique.  No focal opacities, effusions, or pneumothorax are identified by portable technique.  Question focal buckling of the cortex of the lateral arc of the right sixth rib.  An acute right rib fracture cannot be excluded, but is not definite.  IMPRESSION:  1.  Cannot exclude an acute buckle type fracture of the lateral right eighth rib. A right rib series could be performed for further evaluation, if indicated. 2.  Low lung volumes.  No definite acute cardiopulmonary disease by  portable technique.   Original Report Authenticated By: Britta Mccreedy, M.D.    Dg Knee Ap/lat W/sunrise Left  04/13/2012  *RADIOLOGY REPORT*  Clinical Data: Fall, left knee pain  DG KNEE - 3 VIEWS  Comparison: None.  Findings: No acute fracture, malalignment, or joint effusion.  Very mild early degenerative change with peaking of the tibial spines and early osteophyte formation along the  lateral patella.  IMPRESSION:  1.  No acute fracture, malalignment or joint effusion. 2.  Mild, early degenerative changes with peaking of the tibial spines and trace osteophyte formation at the lateral patellar margin.   Original Report Authenticated By: HEATH      1. Multiple fractures of ribs of right side   2. Fall from ladder       MDM          Nelia Shi, MD 04/14/12 [phone]

## 2012-04-12 NOTE — ED Notes (Signed)
Pt fell approx 5 feet from ladder then fell again into an empty pool approx 6 feet. Denies LOC. C/o right rib, RUE,  RLE, right hip & RUQ pain.

## 2012-04-12 NOTE — ED Provider Notes (Signed)
MSE was initiated and I personally evaluated the patient and placed orders (if any) at  2:54 PM on April 12, 2012.  Patient presents to the emergency room after falling from a ladder onto concrete on the edge of an empty pool. Patient landed on his right side injuring his ribs and hip. He also complains of pain in his right upper abdomen at the inferior portion of his ribs. Patient denies any loss of consciousness. He does have pain in his neck as well. Patient states the pain is severe and increases with movement.  On exam he is noted to have tenderness palpation on the chest wall in the spine. IV pain medications have been ordered. X-rays have been ordered.    Celene Kras, MD 04/12/12 1455

## 2012-04-12 NOTE — ED Notes (Signed)
Paged Dr. Daphine Deutscher to [phone]

## 2012-04-12 NOTE — ED Notes (Signed)
Patient transported to X-ray 

## 2012-04-13 ENCOUNTER — Encounter (HOSPITAL_COMMUNITY): Payer: Self-pay | Admitting: *Deleted

## 2012-04-13 ENCOUNTER — Inpatient Hospital Stay (HOSPITAL_COMMUNITY): Payer: Medicare Other

## 2012-04-13 DIAGNOSIS — D62 Acute posthemorrhagic anemia: Secondary | ICD-10-CM | POA: Diagnosis not present

## 2012-04-13 DIAGNOSIS — S2249XA Multiple fractures of ribs, unspecified side, initial encounter for closed fracture: Secondary | ICD-10-CM

## 2012-04-13 DIAGNOSIS — W11XXXA Fall on and from ladder, initial encounter: Secondary | ICD-10-CM | POA: Diagnosis present

## 2012-04-13 DIAGNOSIS — E118 Type 2 diabetes mellitus with unspecified complications: Secondary | ICD-10-CM | POA: Insufficient documentation

## 2012-04-13 DIAGNOSIS — N289 Disorder of kidney and ureter, unspecified: Secondary | ICD-10-CM

## 2012-04-13 DIAGNOSIS — S80212A Abrasion, left knee, initial encounter: Secondary | ICD-10-CM | POA: Diagnosis present

## 2012-04-13 DIAGNOSIS — E119 Type 2 diabetes mellitus without complications: Secondary | ICD-10-CM

## 2012-04-13 DIAGNOSIS — S2241XA Multiple fractures of ribs, right side, initial encounter for closed fracture: Secondary | ICD-10-CM | POA: Diagnosis present

## 2012-04-13 LAB — CBC
MCH: 32.6 pg (ref 26.0–34.0)
MCHC: 33.9 g/dL (ref 30.0–36.0)
RDW: 15.7 % — ABNORMAL HIGH (ref 11.5–15.5)

## 2012-04-13 LAB — BASIC METABOLIC PANEL
Calcium: 8.3 mg/dL — ABNORMAL LOW (ref 8.4–10.5)
GFR calc Af Amer: 46 mL/min — ABNORMAL LOW (ref >90)
GFR calc non Af Amer: 39 mL/min — ABNORMAL LOW (ref >90)
Glucose, Bld: 134 mg/dL — ABNORMAL HIGH (ref 70–99)
Potassium: 4.2 mEq/L (ref 3.5–5.1)
Sodium: 140 mEq/L (ref 135–145)

## 2012-04-13 LAB — MRSA PCR SCREENING
MRSA by PCR: NEGATIVE
MRSA by PCR: NEGATIVE

## 2012-04-13 LAB — GLUCOSE, CAPILLARY
Glucose-Capillary: 199 mg/dL — ABNORMAL HIGH (ref 70–99)
Glucose-Capillary: 285 mg/dL — ABNORMAL HIGH (ref 70–99)

## 2012-04-13 MED ORDER — IPRATROPIUM-ALBUTEROL 18-103 MCG/ACT IN AERO
2.0000 | INHALATION_SPRAY | Freq: Four times a day (QID) | RESPIRATORY_TRACT | Status: DC | PRN
  Filled 2012-04-13: qty 14.7

## 2012-04-13 MED ORDER — INSULIN GLARGINE 100 UNIT/ML ~~LOC~~ SOLN
50.0000 [IU] | Freq: Two times a day (BID) | SUBCUTANEOUS | Status: DC
  Administered 2012-04-13 (×2): 50 [IU] via SUBCUTANEOUS

## 2012-04-13 MED ORDER — INSULIN ASPART 100 UNIT/ML ~~LOC~~ SOLN
0.0000 [IU] | Freq: Three times a day (TID) | SUBCUTANEOUS | Status: DC
  Administered 2012-04-13: 4 [IU] via SUBCUTANEOUS
  Administered 2012-04-13: 11 [IU] via SUBCUTANEOUS
  Administered 2012-04-14: 7 [IU] via SUBCUTANEOUS
  Administered 2012-04-14: 4 [IU] via SUBCUTANEOUS
  Administered 2012-04-14: 7 [IU] via SUBCUTANEOUS
  Administered 2012-04-15 – 2012-04-16 (×3): 4 [IU] via SUBCUTANEOUS
  Administered 2012-04-19: 3 [IU] via SUBCUTANEOUS
  Administered 2012-04-19: 4 [IU] via SUBCUTANEOUS
  Administered 2012-04-19: 3 [IU] via SUBCUTANEOUS
  Administered 2012-04-20: 4 [IU] via SUBCUTANEOUS

## 2012-04-13 MED ORDER — INSULIN ASPART 100 UNIT/ML ~~LOC~~ SOLN: 0.0000 [IU] | Freq: Every day | SUBCUTANEOUS | Status: DC

## 2012-04-13 NOTE — H&P (Signed)
Colton Carter is an 66 y.o. male.   Chief Complaint: fall with right rib fractures HPI:  Pt is a 66 year old male who was fixing a pool shed and fell off ladder onto the edge of the pool into the empty pool.  He struck his RUE and right lateral chest.  He did not strike his head or pass out.  He states that he "lost his balance," but he denies passing out.  He does complain of a headache, which he attributes to hyperglycemia.  His main complaints are right chest and arm pain, left knee pain, and neck pain.  He had a cervical spine fusion previously, and he states that the pain is like his baseline.  He is having some trouble taking deep breaths.    Past Medical History  Diagnosis Date  . Coronary artery disease   . Diabetes mellitus   . Hypertension   . Arthritis   . High cholesterol   . Renal disorder   . Chronic neck pain     Past Surgical History  Procedure Date  . Cervical fusion     No family history on file. Social History:  reports that he has been smoking.  He does not have any smokeless tobacco history on file. He reports that he drinks alcohol. He reports that he does not use illicit drugs.  Allergies: No Known Allergies  Medications Prior to Admission  Medication Sig Dispense Refill  . ALPRAZolam (XANAX) 0.25 MG tablet Take 0.25-0.5 mg by mouth 3 (three) times daily. Takes 2 tabs in am, 1 at lunch and 1 at bedtime      . atenolol (TENORMIN) 25 MG tablet Take 25 mg by mouth daily.      . cyclobenzaprine (FLEXERIL) 10 MG tablet Take 10 mg by mouth 2 (two) times daily.      . febuxostat (ULORIC) 40 MG tablet Take 40-80 mg by mouth See admin instructions. Take 2 tablets on one day then takes 1 tablet      . Fluticasone-Salmeterol (ADVAIR) 250-50 MCG/DOSE AEPB Inhale 1 puff into the lungs every 12 (twelve) hours.      . furosemide (LASIX) 40 MG tablet Take 40 mg by mouth 2 (two) times daily.      Marland Kitchen gabapentin (NEURONTIN) 600 MG tablet Take 600 mg by mouth 3 (three) times daily.       Marland Kitchen HYDROcodone-acetaminophen (LORTAB) 10-500 MG per tablet Take 1 tablet by mouth every 5 (five) hours as needed.      . insulin aspart (NOVOLOG) 100 UNIT/ML injection Inject 5-15 Units into the skin 3 (three) times daily before meals. SS 100-150 = 5 units 151-200 = 7 units 201-250 = 9 units 251-300 =11 units 301-350 = 13 units 351-400 = 15 units      . insulin glargine (LANTUS) 100 UNIT/ML injection Inject 75 Units into the skin 2 (two) times daily.       Marland Kitchen lisinopril-hydrochlorothiazide (PRINZIDE,ZESTORETIC) 20-25 MG per tablet Take 1 tablet by mouth daily.      . meloxicam (MOBIC) 15 MG tablet Take 15 mg by mouth daily.      . methotrexate 1 G injection Inject into the vein every 7 (seven) days. 25mg /ml. For arthritis      . montelukast (SINGULAIR) 10 MG tablet Take 10 mg by mouth daily.      Marland Kitchen omeprazole (PRILOSEC) 20 MG capsule Take 20 mg by mouth daily.      . simvastatin (ZOCOR) 40 MG tablet  Take 40 mg by mouth daily.      Marland Kitchen testosterone cypionate (DEPOTESTOTERONE CYPIONATE) 200 MG/ML injection Inject 200 mg into the muscle every 14 (fourteen) days.      Marland Kitchen tiotropium (SPIRIVA) 18 MCG inhalation capsule Place 18 mcg into inhaler and inhale daily.        Results for orders placed during the hospital encounter of 04/12/12 (from the past 48 hour(s))  CBC WITH DIFFERENTIAL     Status: Abnormal   Collection Time   04/12/12  2:51 PM      Component Value Range Comment   WBC 7.7  4.0 - 10.5 K/uL    RBC 4.06 (*) 4.22 - 5.81 MIL/uL    Hemoglobin 13.4  13.0 - 17.0 g/dL    HCT 16.1  09.6 - 04.5 %    MCV 96.3  78.0 - 100.0 fL    MCH 33.0  26.0 - 34.0 pg    MCHC 34.3  30.0 - 36.0 g/dL    RDW 40.9 (*) 81.1 - 15.5 %    Platelets 130 (*) 150 - 400 K/uL    Neutrophils Relative 76  43 - 77 %    Neutro Abs 5.9  1.7 - 7.7 K/uL    Lymphocytes Relative 13  12 - 46 %    Lymphs Abs 1.0  0.7 - 4.0 K/uL    Monocytes Relative 7  3 - 12 %    Monocytes Absolute 0.5  0.1 - 1.0 K/uL    Eosinophils  Relative 3  0 - 5 %    Eosinophils Absolute 0.2  0.0 - 0.7 K/uL    Basophils Relative 1  0 - 1 %    Basophils Absolute 0.0  0.0 - 0.1 K/uL   POCT I-STAT, CHEM 8     Status: Abnormal   Collection Time   04/12/12  3:08 PM      Component Value Range Comment   Sodium 144  135 - 145 mEq/L    Potassium 4.3  3.5 - 5.1 mEq/L    Chloride 102  96 - 112 mEq/L    BUN 30 (*) 6 - 23 mg/dL    Creatinine, Ser 9.14 (*) 0.50 - 1.35 mg/dL    Glucose, Bld 87  70 - 99 mg/dL    Calcium, Ion 7.82 (*) 1.13 - 1.30 mmol/L    TCO2 28  0 - 100 mmol/L    Hemoglobin 13.3  13.0 - 17.0 g/dL    HCT 95.6  21.3 - 08.6 %   URINALYSIS, ROUTINE W REFLEX MICROSCOPIC     Status: Normal   Collection Time   04/12/12  6:27 PM      Component Value Range Comment   Color, Urine YELLOW  YELLOW    APPearance CLEAR  CLEAR    Specific Gravity, Urine 1.014  1.005 - 1.030    pH 5.5  5.0 - 8.0    Glucose, UA NEGATIVE  NEGATIVE mg/dL    Hgb urine dipstick NEGATIVE  NEGATIVE    Bilirubin Urine NEGATIVE  NEGATIVE    Ketones, ur NEGATIVE  NEGATIVE mg/dL    Protein, ur NEGATIVE  NEGATIVE mg/dL    Urobilinogen, UA 1.0  0.0 - 1.0 mg/dL    Nitrite NEGATIVE  NEGATIVE    Leukocytes, UA NEGATIVE  NEGATIVE MICROSCOPIC NOT DONE ON URINES WITH NEGATIVE PROTEIN, BLOOD, LEUKOCYTES, NITRITE, OR GLUCOSE <1000 mg/dL.  CBC     Status: Abnormal   Collection Time   04/12/12  9:56 PM      Component Value Range Comment   WBC 8.5  4.0 - 10.5 K/uL    RBC 4.18 (*) 4.22 - 5.81 MIL/uL    Hemoglobin 13.6  13.0 - 17.0 g/dL    HCT 16.1  09.6 - 04.5 %    MCV 96.9  78.0 - 100.0 fL    MCH 32.5  26.0 - 34.0 pg    MCHC 33.6  30.0 - 36.0 g/dL    RDW 40.9 (*) 81.1 - 15.5 %    Platelets 114 (*) 150 - 400 K/uL PLATELET COUNT CONFIRMED BY SMEAR  CREATININE, SERUM     Status: Abnormal   Collection Time   04/12/12  9:56 PM      Component Value Range Comment   Creatinine, Ser 1.69 (*) 0.50 - 1.35 mg/dL    GFR calc non Af Amer 41 (*) >90 mL/min    GFR calc Af Amer  47 (*) >90 mL/min    Dg Thoracic Spine 2 View  04/12/2012  *RADIOLOGY REPORT*  Clinical Data: Back pain secondary to a fall.  THORACIC SPINE - 2 VIEW  Comparison: Chest x-ray dated 04/12/2012  Findings: There is no appreciable fracture, subluxation, or bone destruction.  Minimal scoliosis in the mid thoracic spine.  IMPRESSION: No visible acute abnormality.  The thoracic spine will be better seen on chest CT to follow.   Original Report Authenticated By: Gwynn Burly, M.D.    Dg Lumbar Spine Complete  04/12/2012  *RADIOLOGY REPORT*  Clinical Data: Trauma.  Fall on the right side. Back pain  LUMBAR SPINE - COMPLETE 4+ VIEW  Comparison: None.  Findings: No evidence for fracture.  No subluxation. Intervertebral disc spaces are preserved.  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  SI joints are normal bilaterally.  IMPRESSION: No acute bony findings.   Original Report Authenticated By: ERIC A. MANSELL, M.D.    Ct Chest/Abd/pelvis W Contrast  04/12/2012  *RADIOLOGY REPORT*  Clinical Data:  5 feet fall onto concrete injury the right side with rib and hip pain.  Upper abdominal pain.  CT CHEST, ABDOMEN AND PELVIS WITH CONTRAST  Technique:  Multidetector CT imaging of the chest, abdomen and pelvis was performed following the standard protocol during bolus administration of intravenous contrast.  Contrast: 80mL OMNIPAQUE IOHEXOL 300 MG/ML  SOLN  Comparison:   None.  CT CHEST  Findings:  There is no axillary, mediastinal, or hilar lymphadenopathy.  Heart size is normal.  There is no pericardial or pleural effusion.  Lung windows show no evidence for pneumothorax.  There is some dependent atelectasis in the posterior lungs bilaterally.  No focal airspace consolidation or evidence of pulmonary contusion.  4 mm pulmonary nodule is seen in the left lower lobe on image 29.  Bone windows show acute fractures of the lateral right fourth through eighth ribs.  IMPRESSION: Acute fractures of the lateral right fourth  through eighth ribs. No evidence for segmental rib fracture.  No underlying pneumothorax or pleural effusion.  4 mm left lower lobe pulmonary nodule. If the patient is at high risk for bronchogenic carcinoma, follow-up chest CT at 1 year is recommended.  If the patient is at low risk, no follow-up is needed.  This recommendation follows the consensus statement: Guidelines for Management of Small Pulmonary Nodules Detected on CT Scans:  A Statement from the Fleischner Society as published in Radiology 2005; 237:395-400.  CT ABDOMEN AND PELVIS  Findings:  The liver, spleen, stomach, duodenum, pancreas,  adrenal glands, and kidneys are unremarkable.  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  No abdominal aortic aneurysm.  No free fluid or lymphadenopathy in the abdomen.  Imaging through the pelvis shows no free intraperitoneal fluid. The bladder is markedly distended soft tissue band extending from the bladder dome to the umbilicus is consistent with the presence of a ureteral remnant.  Prostate gland is mildly enlarged with central dystrophic calcification.  There is no pelvic sidewall lymphadenopathy.  Bilateral inguinal hernias contain only fat.  Diverticular changes are seen in the sigmoid colon without diverticulitis.  The terminal ileum is unremarkable. The appendix is not visualized, but there is no edema or inflammation in the region of the cecum.  No evidence for fracture in the bony pelvis or lumbar spine.  IMPRESSION: No acute traumatic organ injury in the abdomen or pelvis.  No intraperitoneal free fluid.   Original Report Authenticated By: ERIC A. MANSELL, M.D.    Ct Cervical Spine Wo Contrast  04/12/2012  *RADIOLOGY REPORT*  Clinical Data: The patient fell 5 feet from ladder onto concrete. Neck pain.  CT CERVICAL SPINE WITHOUT CONTRAST  Technique:  Multidetector CT imaging of the cervical spine was performed. Multiplanar CT image  reconstructions were also generated.  Comparison: 07/27/2007  Findings: Imaging was obtained from the skull base through the T2 vertebral body.  The patient is status post extensive cervical fusion with ACDF at C3-4, C4-5, C5-6 with plating.  There is complete loss of disc height at C6-7 and the C6 screw crosses the anterior portion of the C6-7 interspace.  Straightening of the normal cervical lordosis is evident.  No evidence of an acute fracture.  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  The patient is also noted be fused posteriorly at C6-7.  Degenerative disc and endplate disease is seen at C7-T1.  The facets are well-aligned bilaterally with degenerative changes at multiple levels.  The left C3-4 facets are fused.  No evidence for prevertebral soft tissue swelling.  IMPRESSION: Status post ACDF with anterior plating from C3-C6.  The patient is also fused across the C6-7 interspace posteriorly.  No evidence for an acute fracture on the current exam.   Original Report Authenticated By: ERIC A. MANSELL, M.D.   Dg Pelvis Portable  04/12/2012  *RADIOLOGY REPORT*  Clinical Data: Post fall, now with diffuse back pain, particularly on the right side.  PORTABLE PELVIS  Comparison: None.  Findings:  Examination is degraded secondary to patient positioning and exclusion of the right lateral aspect of the ilium and the proximal aspect of the right femur.  No definite pelvic fracture.  The regional soft tissues are normal. No radiopaque foreign body.  IMPRESSION: Degraded examination without definite acute finding.   Original Report Authenticated By: Waynard Reeds, M.D.    Dg Chest Portable 1 View  04/12/2012  *RADIOLOGY REPORT*  Clinical Data: Fall.  Pain in back and right side.  PORTABLE CHEST - 1 VIEW  Comparison: None.  Findings: Cardiac leads project over the chest.  Plate and screw fixation of the lower cervical spine is visualized.  Lung volumes are low bilaterally.  Heart  and mediastinal contours appear within normal limits for portable technique.  No focal opacities, effusions, or pneumothorax are identified by portable technique.  Question focal buckling of the cortex of the lateral arc of the right sixth rib.  An acute right rib  fracture cannot be excluded, but is not definite.  IMPRESSION:  1.  Cannot exclude an acute buckle type fracture of the lateral right eighth rib. A right rib series could be performed for further evaluation, if indicated. 2.  Low lung volumes.  No definite acute cardiopulmonary disease by portable technique.   Original Report Authenticated By: Britta Mccreedy, M.D.     Review of Systems  Constitutional: Negative.   Eyes: Negative.   Respiratory: Positive for shortness of breath.   Cardiovascular: Positive for chest pain. Negative for palpitations.  Gastrointestinal: Negative.   Genitourinary: Negative.   Musculoskeletal: Negative.   Skin: Negative.   Neurological: Positive for headaches.  Endo/Heme/Allergies: Negative.   Psychiatric/Behavioral: Negative.     Blood pressure 176/93, pulse 74, temperature 97.9 F (36.6 C), temperature source Oral, resp. rate 18, height 5\' 7"  (1.702 m), weight 205 lb (92.987 kg), SpO2 97.00%. Physical Exam   Assessment/Plan Fall  Rib fractures - pain control, pulmonary toilet.  Left knee pain - get plain film  Acute renal insufficiency - hydrate.  DM - lantus and SSI  HTN - atenolol, lisinopril, HCTZ, furosemide.  Gout - uloric  Chronic pain - flexeril, norco, mobic.    COPD - advair/singulair.    Garan Frappier 04/13/2012, 1:17 AM

## 2012-04-13 NOTE — Clinical Social Work Psychosocial (Signed)
     Clinical Social Work Department BRIEF PSYCHOSOCIAL ASSESSMENT 04/13/2012  Patient:  Colton Carter, Colton Carter     Account Number:  [id number]     Admit date:  04/12/2012  Clinical Social Worker:  Pearson Forster  Date/Time:  04/13/2012 04:30 PM  Referred by:  Physician  Date Referred:  04/13/2012 Referred for  Psychosocial assessment   Other Referral:   Interview type:  Patient Other interview type:   Patient girlfriend of 9 years at bedside    PSYCHOSOCIAL DATA Living Status:  SIGNIFICANT OTHER Admitted from facility:   Level of care:   Primary support name:  Huel Cote  [phone] Primary support relationship to patient:  PARTNER Degree of support available:   Strong    CURRENT CONCERNS Current Concerns  None Noted   Other Concerns:    SOCIAL WORK ASSESSMENT / PLAN Clinical Social Worker met with patient and patient girlfriend at bedside to offer support and discuss patient plans at discharge.  Patient states that he was up on a ladder and lost his balance falling several feet and landing in an empty pool.  He currently lives with his girlfriend of 9 years and his 43 year old granddaughter who he provides for.  Patient would like to return home with his family once medically ready.  Patient girlfriend states that she can be home 24 hours a day to assist patient as needed.    Clinical Social Worker inquired about current substance use.  Patient states that he drinks 1-2 a month and just socially.  Patient does not express concern on his current use and understands the consequences associated with alcohol and medication.  SBIRT complete.  No resources needed at this time.    Clinical Social Worker signing off at this time.  No further social work needs identified at this time.  Please reconsult if needs arise.   Assessment/plan status:  No Further Intervention Required Other assessment/ plan:   Information/referral to community resources:   Patient and patient  girlfriend refused resources at this time.  They seem to be well connected in the community that they live.    PATIENTS/FAMILYS RESPONSE TO PLAN OF CARE: Patient alert and oriented x3 sitting on the edge of the bed.  Patient was very forthcoming with information and understanding in social work role for his hospitalization. Patient plans to return home with his family who will provide transportation at discharge.  Patient and patient girlfriend expressed their appreciation for CSW support and concern.

## 2012-04-13 NOTE — Progress Notes (Signed)
UR completed 

## 2012-04-13 NOTE — Progress Notes (Signed)
Patient ID: Colton Carter, male   DOB: 1946-07-08, 66 y.o.   MRN: [id number]    Subjective: Pain right ribs  Objective: Vital signs in last 24 hours: Temp:  [97.8 F (36.6 C)-98.7 F (37.1 C)] 98.5 F (36.9 C) (10/01 0435) Pulse Rate:  [80-105] 95  (10/01 0700) Resp:  [13-23] 13  (10/01 0700) BP: (91-123)/(51-78) 107/54 mmHg (10/01 0700) SpO2:  [85 %-100 %] 91 % (10/01 0700) Weight:  [92.987 kg (205 lb)] 92.987 kg (205 lb) (09/30 1434) Last BM Date: 04/12/12  Intake/Output from previous day: 09/30 0701 - 10/01 0700 In: -  Out: 1030 [Urine:1030] Intake/Output this shift:    General appearance: cooperative Resp: clear to auscultation bilaterally Chest wall: right sided chest wall tenderness Cardio: regular rate and rhythm GI: soft, NT, ND, +BS Extremities: abrasion and tenderness L knee Neuro: arouses easily and F/C  Lab Results: CBC   Basename 04/13/12 0645 04/12/12 2156  WBC 6.5 8.5  HGB 12.8* 13.6  HCT 37.8* 40.5  PLT 101* 114*   BMET  Basename 04/13/12 0645 04/12/12 2156 04/12/12 1508  NA 140 -- 144  K 4.2 -- 4.3  CL 100 -- 102  CO2 31 -- --  GLUCOSE 134* -- 87  BUN 27* -- 30*  CREATININE 1.74* 1.69* --  CALCIUM 8.3* -- --   PT/INR No results found for this basename: LABPROT:2,INR:2 in the last 72 hours ABG No results found for this basename: PHART:2,PCO2:2,PO2:2,HCO3:2 in the last 72 hours  Studies/Results: Dg Thoracic Spine 2 View  04/12/2012  *RADIOLOGY REPORT*  Clinical Data: Back pain secondary to a fall.  THORACIC SPINE - 2 VIEW  Comparison: Chest x-ray dated 04/12/2012  Findings: There is no appreciable fracture, subluxation, or bone destruction.  Minimal scoliosis in the mid thoracic spine.  IMPRESSION: No visible acute abnormality.  The thoracic spine will be better seen on chest CT to follow.   Original Report Authenticated By: Gwynn Burly, M.D.    Dg Lumbar Spine Complete  04/12/2012  *RADIOLOGY REPORT*  Clinical Data: Trauma.  Fall on the  right side. Back pain  LUMBAR SPINE - COMPLETE 4+ VIEW  Comparison: None.  Findings: No evidence for fracture.  No subluxation. Intervertebral disc spaces are preserved.  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  SI joints are normal bilaterally.  IMPRESSION: No acute bony findings.   Original Report Authenticated By: ERIC A. MANSELL, M.D.    Ct Chest W Contrast  04/12/2012  *RADIOLOGY REPORT*  Clinical Data:  5 feet fall onto concrete injury the right side with rib and hip pain.  Upper abdominal pain.  CT CHEST, ABDOMEN AND PELVIS WITH CONTRAST  Technique:  Multidetector CT imaging of the chest, abdomen and pelvis was performed following the standard protocol during bolus administration of intravenous contrast.  Contrast: 80mL OMNIPAQUE IOHEXOL 300 MG/ML  SOLN  Comparison:   None.  CT CHEST  Findings:  There is no axillary, mediastinal, or hilar lymphadenopathy.  Heart size is normal.  There is no pericardial or pleural effusion.  Lung windows show no evidence for pneumothorax.  There is some dependent atelectasis in the posterior lungs bilaterally.  No focal airspace consolidation or evidence of pulmonary contusion.  4 mm pulmonary nodule is seen in the left lower lobe on image 29.  Bone windows show acute fractures of the lateral right fourth through eighth ribs.  IMPRESSION: Acute fractures of the lateral right fourth through eighth ribs. No evidence for segmental rib fracture.  No  underlying pneumothorax or pleural effusion.  4 mm left lower lobe pulmonary nodule. If the patient is at high risk for bronchogenic carcinoma, follow-up chest CT at 1 year is recommended.  If the patient is at low risk, no follow-up is needed.  This recommendation follows the consensus statement: Guidelines for Management of Small Pulmonary Nodules Detected on CT Scans:  A Statement from the Fleischner Society as published in Radiology 2005; 237:395-400.  CT ABDOMEN AND PELVIS  Findings:  The liver, spleen, stomach, duodenum,  pancreas, adrenal glands, and kidneys are unremarkable.  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  No abdominal aortic aneurysm.  No free fluid or lymphadenopathy in the abdomen.  Imaging through the pelvis shows no free intraperitoneal fluid. The bladder is markedly distended soft tissue band extending from the bladder dome to the umbilicus is consistent with the presence of a ureteral remnant.  Prostate gland is mildly enlarged with central dystrophic calcification.  There is no pelvic sidewall lymphadenopathy.  Bilateral inguinal hernias contain only fat.  Diverticular changes are seen in the sigmoid colon without diverticulitis.  The terminal ileum is unremarkable. The appendix is not visualized, but there is no edema or inflammation in the region of the cecum.  No evidence for fracture in the bony pelvis or lumbar spine.  IMPRESSION: No acute traumatic organ injury in the abdomen or pelvis.  No intraperitoneal free fluid.   Original Report Authenticated By: ERIC A. MANSELL, M.D.    Ct Cervical Spine Wo Contrast  04/12/2012  *RADIOLOGY REPORT*  Clinical Data: The patient fell 5 feet from ladder onto concrete. Neck pain.  CT CERVICAL SPINE WITHOUT CONTRAST  Technique:  Multidetector CT imaging of the cervical spine was performed. Multiplanar CT image reconstructions were also generated.  Comparison: 07/27/2007  Findings: Imaging was obtained from the skull base through the T2 vertebral body.  The patient is status post extensive cervical fusion with ACDF at C3-4, C4-5, C5-6 with plating.  There is complete loss of disc height at C6-7 and the C6 screw crosses the anterior portion of the C6-7 interspace.  Straightening of the normal cervical lordosis is evident.  No evidence of an acute fracture.  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  The patient is also noted be fused posteriorly at C6-7.  Degenerative  disc and endplate disease is seen at C7-T1.  The facets are well-aligned bilaterally with degenerative changes at multiple levels.  The left C3-4 facets are fused.  No evidence for prevertebral soft tissue swelling.  IMPRESSION: Status post ACDF with anterior plating from C3-C6.  The patient is also fused across the C6-7 interspace posteriorly.  No evidence for an acute fracture on the current exam.   Original Report Authenticated By: ERIC A. MANSELL, M.D.    Ct Abdomen Pelvis W Contrast  04/12/2012  *RADIOLOGY REPORT*  Clinical Data:  5 feet fall onto concrete injury the right side with rib and hip pain.  Upper abdominal pain.  CT CHEST, ABDOMEN AND PELVIS WITH CONTRAST  Technique:  Multidetector CT imaging of the chest, abdomen and pelvis was performed following the standard protocol during bolus administration of intravenous contrast.  Contrast: 80mL OMNIPAQUE IOHEXOL 300 MG/ML  SOLN  Comparison:   None.  CT CHEST  Findings:  There is no axillary, mediastinal, or hilar lymphadenopathy.  Heart size is normal.  There is no pericardial or pleural effusion.  Lung  windows show no evidence for pneumothorax.  There is some dependent atelectasis in the posterior lungs bilaterally.  No focal airspace consolidation or evidence of pulmonary contusion.  4 mm pulmonary nodule is seen in the left lower lobe on image 29.  Bone windows show acute fractures of the lateral right fourth through eighth ribs.  IMPRESSION: Acute fractures of the lateral right fourth through eighth ribs. No evidence for segmental rib fracture.  No underlying pneumothorax or pleural effusion.  4 mm left lower lobe pulmonary nodule. If the patient is at high risk for bronchogenic carcinoma, follow-up chest CT at 1 year is recommended.  If the patient is at low risk, no follow-up is needed.  This recommendation follows the consensus statement: Guidelines for Management of Small Pulmonary Nodules Detected on CT Scans:  A Statement from the Fleischner  Society as published in Radiology 2005; 237:395-400.  CT ABDOMEN AND PELVIS  Findings:  The liver, spleen, stomach, duodenum, pancreas, adrenal glands, and kidneys are unremarkable.  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  No abdominal aortic aneurysm.  No free fluid or lymphadenopathy in the abdomen.  Imaging through the pelvis shows no free intraperitoneal fluid. The bladder is markedly distended soft tissue band extending from the bladder dome to the umbilicus is consistent with the presence of a ureteral remnant.  Prostate gland is mildly enlarged with central dystrophic calcification.  There is no pelvic sidewall lymphadenopathy.  Bilateral inguinal hernias contain only fat.  Diverticular changes are seen in the sigmoid colon without diverticulitis.  The terminal ileum is unremarkable. The appendix is not visualized, but there is no edema or inflammation in the region of the cecum.  No evidence for fracture in the bony pelvis or lumbar spine.  IMPRESSION: No acute traumatic organ injury in the abdomen or pelvis.  No intraperitoneal free fluid.   Original Report Authenticated By: ERIC A. MANSELL, M.D.    Dg Pelvis Portable  04/12/2012  *RADIOLOGY REPORT*  Clinical Data: Post fall, now with diffuse back pain, particularly on the right side.  PORTABLE PELVIS  Comparison: None.  Findings:  Examination is degraded secondary to patient positioning and exclusion of the right lateral aspect of the ilium and the proximal aspect of the right femur.  No definite pelvic fracture.  The regional soft tissues are normal. No radiopaque foreign body.  IMPRESSION: Degraded examination without definite acute finding.   Original Report Authenticated By: Waynard Reeds, M.D.    Dg Chest Portable 1 View  04/12/2012  *RADIOLOGY REPORT*  Clinical Data: Fall.  Pain in back and right side.  PORTABLE CHEST - 1 VIEW  Comparison: None.  Findings: Cardiac leads  project over the chest.  Plate and screw fixation of the lower cervical spine is visualized.  Lung volumes are low bilaterally.  Heart and mediastinal contours appear within normal limits for portable technique.  No focal opacities, effusions, or pneumothorax are identified by portable technique.  Question focal buckling of the cortex of the lateral arc of the right sixth rib.  An acute right rib fracture cannot be excluded, but is not definite.  IMPRESSION:  1.  Cannot exclude an acute buckle type fracture of the lateral right eighth rib. A right rib series could be performed for further evaluation, if indicated. 2.  Low lung volumes.  No definite acute cardiopulmonary disease by portable technique.   Original Report Authenticated By: Britta Mccreedy, M.D.  Anti-infectives: Anti-infectives    None      Assessment/Plan: Fall R rib Fx 4-8 - add BDs, pulmonary toilet L knee abrasion - X-ray P IDDM - resume Lantus, add SSI FEN - advance diet VTE - lovenox To floor   LOS: 1 day    Violeta Gelinas, MD, MPH, FACS Pager: [phone]  04/13/2012

## 2012-04-14 ENCOUNTER — Inpatient Hospital Stay (HOSPITAL_COMMUNITY): Payer: Medicare Other

## 2012-04-14 DIAGNOSIS — E1122 Type 2 diabetes mellitus with diabetic chronic kidney disease: Secondary | ICD-10-CM | POA: Insufficient documentation

## 2012-04-14 LAB — CBC
Hemoglobin: 12.5 g/dL — ABNORMAL LOW (ref 13.0–17.0)
MCHC: 34 g/dL (ref 30.0–36.0)
Platelets: 102 10*3/uL — ABNORMAL LOW (ref 150–400)

## 2012-04-14 LAB — BASIC METABOLIC PANEL
BUN: 37 mg/dL — ABNORMAL HIGH (ref 6–23)
CO2: 30 mEq/L (ref 19–32)
Calcium: 8.4 mg/dL (ref 8.4–10.5)
Chloride: 96 mEq/L (ref 96–112)
Creatinine, Ser: 2.07 mg/dL — ABNORMAL HIGH (ref 0.50–1.35)
GFR calc Af Amer: 40 mL/min — ABNORMAL LOW (ref >90)
GFR calc non Af Amer: 34 mL/min — ABNORMAL LOW (ref >90)
Glucose, Bld: 211 mg/dL — ABNORMAL HIGH (ref 70–99)
Glucose, Bld: 237 mg/dL — ABNORMAL HIGH (ref 70–99)
Potassium: 4.3 mEq/L (ref 3.5–5.1)
Potassium: 4.7 mEq/L (ref 3.5–5.1)
Sodium: 137 mEq/L (ref 135–145)

## 2012-04-14 LAB — GLUCOSE, CAPILLARY
Glucose-Capillary: 201 mg/dL — ABNORMAL HIGH (ref 70–99)
Glucose-Capillary: 234 mg/dL — ABNORMAL HIGH (ref 70–99)
Glucose-Capillary: 100 mg/dL — ABNORMAL HIGH (ref 70–99)

## 2012-04-14 MED ORDER — HYDROMORPHONE HCL PF 1 MG/ML IJ SOLN
0.5000 mg | INTRAMUSCULAR | Status: DC | PRN
  Administered 2012-04-15 – 2012-04-19 (×2): 0.5 mg via INTRAVENOUS
  Filled 2012-04-14 (×2): qty 1

## 2012-04-14 MED ORDER — SODIUM CHLORIDE 0.45 % IV SOLN
INTRAVENOUS | Status: DC
  Administered 2012-04-14 – 2012-04-15 (×3): via INTRAVENOUS

## 2012-04-14 MED ORDER — TRAMADOL HCL 50 MG PO TABS
50.0000 mg | ORAL_TABLET | Freq: Four times a day (QID) | ORAL | Status: DC
  Administered 2012-04-14 – 2012-04-19 (×19): 50 mg via ORAL
  Filled 2012-04-14 (×27): qty 1

## 2012-04-14 MED ORDER — HYDROCODONE-ACETAMINOPHEN 10-325 MG PO TABS
0.5000 | ORAL_TABLET | ORAL | Status: DC | PRN
  Administered 2012-04-14 (×2): 2 via ORAL
  Administered 2012-04-14: 1 via ORAL
  Administered 2012-04-15: 2 via ORAL
  Filled 2012-04-14 (×4): qty 2

## 2012-04-14 MED ORDER — INSULIN GLARGINE 100 UNIT/ML ~~LOC~~ SOLN
75.0000 [IU] | Freq: Two times a day (BID) | SUBCUTANEOUS | Status: DC
  Administered 2012-04-14 – 2012-04-16 (×6): 75 [IU] via SUBCUTANEOUS

## 2012-04-14 NOTE — Progress Notes (Signed)
Patient ID: Colton Carter, male   DOB: Nov 07, 1945, 66 y.o.   MRN: [id number]   LOS: 2 days   Subjective: C/o soreness. Unsure if pain meds are effective. Mobilizing on his own.  Objective: Vital signs in last 24 hours: Temp:  [98.2 F (36.8 C)-99.2 F (37.3 C)] 98.2 F (36.8 C) (10/02 0520) Pulse Rate:  [83-99] 85  (10/02 0520) Resp:  [16-24] 16  (10/02 0520) BP: (93-120)/(53-68) 97/58 mmHg (10/02 0520) SpO2:  [91 %-96 %] 91 % (10/02 0520) Last BM Date: 04/12/12   IS:   Lab Results:  CBC  Basename 04/14/12 0540 04/13/12 0645  WBC 7.1 6.5  HGB 12.5* 12.8*  HCT 36.8* 37.8*  PLT 102* 101*   BMET  Basename 04/14/12 0540 04/13/12 0645  NA 137 140  K 4.3 4.2  CL 97 100  CO2 30 31  GLUCOSE 211* 134*  BUN 36* 27*  CREATININE 1.95* 1.74*  CALCIUM 8.4 8.3*   CBG (last 3)   Basename 04/14/12 0751 04/13/12 2113 04/13/12 1704  GLUCAP 201* 159* 285*     Radiology PORTABLE CHEST - 1 VIEW  Comparison: 04/12/2012; chest CT - 04/12/2012  Findings:  Grossly unchanged enlarged cardiac silhouette and mediastinal  contours. Mild pulmonary venous congestion without frank evidence  of edema. Linear heterogeneous opacities within the left mid and  lower lung are favored to represent atelectasis. Minimal improved  conspicuity of the pulmonary vasculature. No definite pleural  effusion or pneumothorax. Known minimally displaced right-sided  rib fractures are not well depicted on this AP portable  examination. Post lower cervical ACDF, incompletely imaged.  IMPRESSION:  1. Known minimal displaced right-sided rib fractures are not well  depicted on this AP portable exam.  2. Minimally improved aeration of the lungs with persistent left  basilar atelectasis.  Original Report Authenticated By: Waynard Reeds, M.D.   General appearance: alert and no distress Resp: clear to auscultation bilaterally Cardio: regular rate and rhythm GI: normal findings: bowel sounds normal  and soft, non-tender   Assessment/Plan: Fall  R rib Fx 4-8 - Pulmonary toilet L knee abrasion  IDDM - Increase Lantus to home dose CRF -- Has a long history of this. Sees nephrologist in [place]. Will give fluids today, recheck later. Unsure of baseline, pt says it fluctuates often. FEN - No issues, no NSAID with CRF. Will add tramadol at reduced dose. VTE - lovenox, SCD's Dispo -- Possibly home this afternoon.    Freeman Caldron, PA-C Pager: [phone] General Trauma PA Pager: [phone]   04/14/2012    Coarse breath sounds, but saturations are okay.  Possibly able to go home later today.  This patient has been seen and I agree with the findings and treatment plan.  Marta Lamas. Gae Bon, MD, FACS [phone] (pager) [phone] (direct pager) Trauma Surgeon

## 2012-04-14 NOTE — Progress Notes (Signed)
Occupational Therapy Discharge Patient Details Name: [name] MRN: [id number] DOB: Jan 22, 1946 Today's Date: 04/14/2012 Time:  -     Patient discharged from OT services secondary to pt reports and girl friend concurs that pt is able to do this self care skills (minus putting his right sock on) and girlfriend can be at home to help him prn. Pt reports that he has been up in the halls walking and has also taken a shower today. No OT needs identified, we will sign off..  Please see latest therapy progress note for current level of functioning and progress toward goals.    Progress and discharge plan discussed with patient and/or caregiver: Patient/Caregiver agrees with plan       Evette Georges [phone] 04/14/2012, 1:27 PM

## 2012-04-14 NOTE — Evaluation (Signed)
Physical Therapy Evaluation Patient Details Name: Colton Carter MRN: [id number] DOB: 25-Aug-1945 Today's Date: 04/14/2012 Time: 4098-1191 PT Time Calculation (min): 9 min  PT Assessment / Plan / Recommendation Clinical Impression  66 yo adm s/p fall from ladder to strike edge of pool and then fell into empty pool suffering Rt rib fxs. Pt mobiliizing well with no skilled PT needs identified.    PT Assessment  Patent does not need any further PT services    Follow Up Recommendations  No PT follow up    Barriers to Discharge        Equipment Recommendations  None recommended by PT    Recommendations for Other Services     Frequency      Precautions / Restrictions Precautions Precautions: None   Pertinent Vitals/Pain Rt side/ribs; minimal grimacing with movement; using RUE functionally      Mobility  Bed Mobility Details for Bed Mobility Assistance: Pt sitting on EOB on arrival with [employer] elevated; instructed in best way to perform sit to side and side to sit (pt deferred practice, stating he knew he could do it and did not need to practice); discussed option of sleeping in a recliner if this is too painful  Transfers Transfers: Sit to Stand;Stand to Sit Sit to Stand: 7: Independent;Without upper extremity assist;From bed Stand to Sit: 7: Independent;Without upper extremity assist;To bed Details for Transfer Assistance: pt with no unsteadinesss or lightheadedness; instructed in importance of upright positioning/walking/deep breaths to prevent pneumonia Ambulation/Gait Ambulation/Gait Assistance: 7: Independent Ambulation Distance (Feet): 20 Feet Assistive device: None Ambulation/Gait Assistance Details: Pt chose to carry his breakfast tray over to the counter in his room; no difficulties with balance or dizziness; pt reports he has walked the entire "loop" in the hallway and declined to do so now Gait Pattern: Within Functional Limits    Shoulder Instructions     Exercises      PT Diagnosis:    PT Problem List:   PT Treatment Interventions:     PT Goals    Visit Information  Last PT Received On: 04/14/12 Assistance Needed: +1    Subjective Data  Subjective: Pt wants to take a shower Patient Stated Goal: go home today   Prior Functioning  Home Living Lives With: Significant other;Other (Comment) (girlfriend of 9 yrs and 36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 ) Available Help at Discharge: Family;Available 24 hours/day Additional Comments: details of home not assessed as pt denied concerns re: home environment; discussed bed height/bed mobility vs sleeping in recliner Prior Function Level of Independence: Independent Able to Take Stairs?: Reciprically Driving: Yes Vocation: On disability Communication Communication: No difficulties    Cognition  Overall Cognitive Status: Appears within functional limits for tasks assessed/performed Arousal/Alertness: Awake/alert Orientation Level: Appears intact for tasks assessed Behavior During Session: [facility] for tasks performed    Extremity/Trunk Assessment Right Lower Extremity Assessment RLE ROM/Strength/Tone: [facility] for tasks assessed Left Lower Extremity Assessment LLE ROM/Strength/Tone: [facility] for tasks assessed   Balance    End of Session PT - End of Session Activity Tolerance: Patient tolerated treatment well Patient left: Other (comment);with call bell/phone within reach (sitting on EOB; reports nursing has allowed him up I'ly) Nurse Communication: Mobility status;Other (comment) (OK to d/c from PT standpoint)  GP     Baley Shands 04/14/2012, 8:50 AM  Pager [phone]

## 2012-04-15 ENCOUNTER — Inpatient Hospital Stay (HOSPITAL_COMMUNITY): Payer: Medicare Other

## 2012-04-15 DIAGNOSIS — K219 Gastro-esophageal reflux disease without esophagitis: Secondary | ICD-10-CM | POA: Insufficient documentation

## 2012-04-15 DIAGNOSIS — R7989 Other specified abnormal findings of blood chemistry: Secondary | ICD-10-CM | POA: Insufficient documentation

## 2012-04-15 DIAGNOSIS — R0602 Shortness of breath: Secondary | ICD-10-CM

## 2012-04-15 DIAGNOSIS — M069 Rheumatoid arthritis, unspecified: Secondary | ICD-10-CM | POA: Insufficient documentation

## 2012-04-15 DIAGNOSIS — J449 Chronic obstructive pulmonary disease, unspecified: Secondary | ICD-10-CM | POA: Insufficient documentation

## 2012-04-15 DIAGNOSIS — J95821 Acute postprocedural respiratory failure: Secondary | ICD-10-CM

## 2012-04-15 LAB — URINALYSIS, ROUTINE W REFLEX MICROSCOPIC
Bilirubin Urine: NEGATIVE
Glucose, UA: NEGATIVE mg/dL
Ketones, ur: NEGATIVE mg/dL
Nitrite: NEGATIVE
Protein, ur: NEGATIVE mg/dL

## 2012-04-15 LAB — GLUCOSE, CAPILLARY
Glucose-Capillary: 131 mg/dL — ABNORMAL HIGH (ref 70–99)
Glucose-Capillary: 174 mg/dL — ABNORMAL HIGH (ref 70–99)
Glucose-Capillary: 90 mg/dL (ref 70–99)
Glucose-Capillary: 115 mg/dL — ABNORMAL HIGH (ref 70–99)

## 2012-04-15 LAB — CBC
MCH: 32.4 pg (ref 26.0–34.0)
MCHC: 34.3 g/dL (ref 30.0–36.0)
MCV: 94.4 fL (ref 78.0–100.0)
Platelets: 120 10*3/uL — ABNORMAL LOW (ref 150–400)

## 2012-04-15 LAB — BASIC METABOLIC PANEL
BUN: 36 mg/dL — ABNORMAL HIGH (ref 6–23)
Chloride: 95 mEq/L — ABNORMAL LOW (ref 96–112)
GFR calc Af Amer: 49 mL/min — ABNORMAL LOW (ref >90)
GFR calc non Af Amer: 43 mL/min — ABNORMAL LOW (ref >90)
Potassium: 5.1 mEq/L (ref 3.5–5.1)

## 2012-04-15 LAB — URINE MICROSCOPIC-ADD ON

## 2012-04-15 LAB — SODIUM, URINE, RANDOM: Sodium, Ur: 86 mEq/L

## 2012-04-15 MED ORDER — HYDRALAZINE HCL 20 MG/ML IJ SOLN
10.0000 mg | INTRAMUSCULAR | Status: DC | PRN
  Filled 2012-04-15: qty 0.5

## 2012-04-15 MED ORDER — DIPHENHYDRAMINE HCL 12.5 MG/5ML PO ELIX
12.5000 mg | ORAL_SOLUTION | Freq: Four times a day (QID) | ORAL | Status: DC | PRN
  Filled 2012-04-15: qty 5

## 2012-04-15 MED ORDER — HYDROMORPHONE 0.3 MG/ML IV SOLN
INTRAVENOUS | Status: DC
  Administered 2012-04-15: 0.3 mg via INTRAVENOUS
  Administered 2012-04-15: 15:00:00 via INTRAVENOUS
  Administered 2012-04-15: 0.4 mg via INTRAVENOUS
  Administered 2012-04-16: 0.799 mg via INTRAVENOUS
  Administered 2012-04-16: 0.8 mg via INTRAVENOUS
  Administered 2012-04-16: 0.6 mg via INTRAVENOUS
  Administered 2012-04-16: 1.19 mg via INTRAVENOUS
  Administered 2012-04-16: 0.599 mg via INTRAVENOUS
  Administered 2012-04-17: 09:00:00 via INTRAVENOUS
  Administered 2012-04-17: 0.4 mg via INTRAVENOUS
  Administered 2012-04-17: 1.79 mg via INTRAVENOUS
  Administered 2012-04-17: 0.799 mg via INTRAVENOUS
  Administered 2012-04-17: 0.599 mg via INTRAVENOUS
  Administered 2012-04-17: 0.799 mg via INTRAVENOUS
  Administered 2012-04-17: 0.7999 mg via INTRAVENOUS
  Administered 2012-04-18: 0.2 mg via INTRAVENOUS
  Administered 2012-04-18: 1.19 mg via INTRAVENOUS
  Administered 2012-04-18: 0.2 mg via INTRAVENOUS
  Administered 2012-04-18: 0.799 mg via INTRAVENOUS
  Administered 2012-04-18: 0.39 mg via INTRAVENOUS
  Administered 2012-04-18: 23:00:00 via INTRAVENOUS
  Administered 2012-04-18: 1.19 mg via INTRAVENOUS
  Administered 2012-04-19: 0.99 mg via INTRAVENOUS
  Administered 2012-04-19: 0.4 mg via INTRAVENOUS
  Filled 2012-04-15 (×3): qty 25

## 2012-04-15 MED ORDER — SODIUM CHLORIDE 0.9 % IV SOLN
INTRAVENOUS | Status: DC
  Administered 2012-04-15 – 2012-04-17 (×2): via INTRAVENOUS

## 2012-04-15 MED ORDER — METOPROLOL TARTRATE 1 MG/ML IV SOLN
5.0000 mg | Freq: Four times a day (QID) | INTRAVENOUS | Status: DC
  Administered 2012-04-15: 5 mg via INTRAVENOUS
  Filled 2012-04-15 (×5): qty 5

## 2012-04-15 MED ORDER — OXYCODONE HCL 5 MG PO TABS
10.0000 mg | ORAL_TABLET | ORAL | Status: DC | PRN
  Administered 2012-04-15 – 2012-04-16 (×2): 10 mg via ORAL
  Administered 2012-04-17 (×2): 15 mg via ORAL
  Administered 2012-04-17 – 2012-04-20 (×4): 10 mg via ORAL
  Administered 2012-04-20: 20 mg via ORAL
  Filled 2012-04-15: qty 3
  Filled 2012-04-15: qty 2
  Filled 2012-04-15: qty 3
  Filled 2012-04-15 (×3): qty 2
  Filled 2012-04-15: qty 4
  Filled 2012-04-15: qty 3
  Filled 2012-04-15 (×2): qty 2

## 2012-04-15 MED ORDER — ONDANSETRON HCL 4 MG/2ML IJ SOLN: 4.0000 mg | Freq: Four times a day (QID) | INTRAMUSCULAR | Status: DC | PRN

## 2012-04-15 MED ORDER — LABETALOL HCL 5 MG/ML IV SOLN
10.0000 mg | INTRAVENOUS | Status: DC | PRN
  Filled 2012-04-15: qty 4

## 2012-04-15 MED ORDER — ACETAMINOPHEN 325 MG PO TABS: 650.0000 mg | ORAL_TABLET | ORAL | Status: DC | PRN

## 2012-04-15 MED ORDER — VANCOMYCIN HCL 1000 MG IV SOLR
1250.0000 mg | INTRAVENOUS | Status: DC
  Administered 2012-04-15 – 2012-04-17 (×3): 1250 mg via INTRAVENOUS
  Filled 2012-04-15 (×5): qty 1250

## 2012-04-15 MED ORDER — LORAZEPAM 2 MG/ML IJ SOLN
1.0000 mg | INTRAMUSCULAR | Status: DC | PRN
Start: 2012-04-15 — End: 2012-04-20
  Administered 2012-04-15: 2 mg via INTRAVENOUS
  Filled 2012-04-15: qty 1

## 2012-04-15 MED ORDER — LORAZEPAM 2 MG/ML IJ SOLN
INTRAMUSCULAR | Status: AC
  Filled 2012-04-15: qty 1

## 2012-04-15 MED ORDER — SODIUM CHLORIDE 0.9 % IJ SOLN: 9.0000 mL | INTRAMUSCULAR | Status: DC | PRN

## 2012-04-15 MED ORDER — IPRATROPIUM BROMIDE 0.02 % IN SOLN
0.5000 mg | RESPIRATORY_TRACT | Status: DC
  Administered 2012-04-15 – 2012-04-17 (×13): 0.5 mg via RESPIRATORY_TRACT
  Filled 2012-04-15 (×13): qty 2.5

## 2012-04-15 MED ORDER — NALOXONE HCL 0.4 MG/ML IJ SOLN: 0.4000 mg | INTRAMUSCULAR | Status: DC | PRN

## 2012-04-15 MED ORDER — DIPHENHYDRAMINE HCL 50 MG/ML IJ SOLN: 12.5000 mg | Freq: Four times a day (QID) | INTRAMUSCULAR | Status: DC | PRN

## 2012-04-15 MED ORDER — ALBUTEROL SULFATE (5 MG/ML) 0.5% IN NEBU
2.5000 mg | INHALATION_SOLUTION | RESPIRATORY_TRACT | Status: DC
  Administered 2012-04-15 – 2012-04-17 (×13): 2.5 mg via RESPIRATORY_TRACT
  Filled 2012-04-15 (×13): qty 0.5

## 2012-04-15 MED ORDER — LORAZEPAM 2 MG/ML IJ SOLN
1.0000 mg | Freq: Once | INTRAMUSCULAR | Status: AC
  Administered 2012-04-15: 1 mg via INTRAVENOUS

## 2012-04-15 MED ORDER — PIPERACILLIN-TAZOBACTAM 3.375 G IVPB
3.3750 g | Freq: Three times a day (TID) | INTRAVENOUS | Status: DC
  Administered 2012-04-15 – 2012-04-19 (×12): 3.375 g via INTRAVENOUS
  Filled 2012-04-15 (×14): qty 50

## 2012-04-15 NOTE — Progress Notes (Signed)
Patient ID: Colton Carter, male   DOB: 1946-05-06, 66 y.o.   MRN: [id number] Now in 2300.  Sats 98% on FM O2.  Agitated.  Will give Ativan PRN and try PCA to help control pain.  Tachycardic so will schedule beta blocker.  Duplex pending, cannot do CT chest with renal issues.  Appreciate Dr. Eliane Decree input and I spoke to him at the bedside..  Follow closely.  Of note, I asked his wife about his ETOH use and she claims it is once a week. Violeta Gelinas, MD, MPH, FACS Pager: [phone]

## 2012-04-15 NOTE — Significant Event (Signed)
1050am-accepted patient to 2314 from 6N. At arrival pt was very anxious and agitated, requesting to get up out of bed;  O2 saturations in 85%, HR elevated to 150-160s, RR 30-35. Pt placed on nonrebreathing, with improvement in O2 saturations to 95%. However, pt continued to be agitated. MD Janee Morn made aware; received new orders, including OTO ativan 1mg , IV. Pt is being monitor closely. Alejandro Adcox, Charity fundraiser.

## 2012-04-15 NOTE — Consult Note (Signed)
Reason for Consult: ARF on CKD3 Referring Physician: Violeta Gelinas MD (Trauma Surgery)  Colton Carter is an 66 y.o. male.  HPI:  66 year old Caucasian man with past medical history significant for chronic kidney disease stage III (baseline creatinine about 1.5-follows up with Dr. Thedore Mins in [place]) from underlying diabetes and hypertension. Was brought to the hospital 3 days ago after suffering an accidental fall from a ladder into an empty swimming pool with soft tissue trauma of his arm and multiple right-sided rib fractures. Concern is raised because on admission his creatinine was elevated at 1.8 and slowly risen to 2.1 over the past 3 days. Of note, he had a CT scan of the chest abdomen and pelvis with 80 cc of intravenous contrast done on 04/12/2012. Over the past 3 days, he was continued on his meloxicam, lisinopril, hydrochlorothiazide and furosemide that he was previously taking as an outpatient. Transient hypotensive episodes are noted over the past 24-48 hours with systolic pressures dropping to the 90s. Urine output remains in the nonoliguric range. Earlier this morning, prior to the patient being seen-he had an episode of respiratory distress with tachycardia for which he is being transferred to the intensive care unit. Chest x-ray done does not display any acute pathology. No reports of any nausea, vomiting or diarrhea.  Past Medical History  Diagnosis Date  . Coronary artery disease   . Diabetes mellitus   . Hypertension   . Arthritis   . High cholesterol   . Renal disorder   . Chronic neck pain     Past Surgical History  Procedure Date  . Cervical fusion     History reviewed. No pertinent family history.  Social History:  reports that he has been smoking Cigarettes.  He has been smoking about .25 packs per day. He does not have any smokeless tobacco history on file. He reports that he drinks alcohol. He reports that he does not use illicit drugs.  Allergies: No Known  Allergies  Medications:  Scheduled:   . albuterol  2.5 mg Nebulization Q4H  . ALPRAZolam  0.25-0.5 mg Oral TID  . atenolol  25 mg Oral Daily  . cyclobenzaprine  10 mg Oral BID  . docusate sodium  100 mg Oral BID  . enoxaparin  30 mg Subcutaneous Q24H  . febuxostat  40 mg Oral QODAY  . febuxostat  80 mg Oral QODAY  . Fluticasone-Salmeterol  1 puff Inhalation Q12H  . gabapentin  600 mg Oral TID  . insulin aspart  0-20 Units Subcutaneous TID WC  . insulin aspart  0-5 Units Subcutaneous QHS  . insulin glargine  75 Units Subcutaneous BID  . ipratropium  0.5 mg Nebulization Q4H  . LORazepam      . LORazepam  1 mg Intravenous Once  . montelukast  10 mg Oral Daily  . pantoprazole  40 mg Oral Q1200  . simvastatin  40 mg Oral Daily  . sodium chloride  3 mL Intravenous Q12H  . testosterone cypionate  200 mg Intramuscular Q14 Days  . traMADol  50 mg Oral Q6H  . DISCONTD: furosemide  40 mg Oral BID  . DISCONTD: hydrochlorothiazide  25 mg Oral Daily  . DISCONTD: lisinopril  20 mg Oral Daily  . DISCONTD: meloxicam  15 mg Oral Daily  . DISCONTD: tiotropium  18 mcg Inhalation Daily    Results for orders placed during the hospital encounter of 04/12/12 (from the past 48 hour(s))  GLUCOSE, CAPILLARY     Status: Abnormal  Collection Time   04/13/12 12:15 PM      Component Value Range Comment   Glucose-Capillary 199 (*) 70 - 99 mg/dL    Comment 1 Notify RN     GLUCOSE, CAPILLARY     Status: Abnormal   Collection Time   04/13/12  5:04 PM      Component Value Range Comment   Glucose-Capillary 285 (*) 70 - 99 mg/dL    Comment 1 Notify RN     GLUCOSE, CAPILLARY     Status: Abnormal   Collection Time   04/13/12  9:13 PM      Component Value Range Comment   Glucose-Capillary 159 (*) 70 - 99 mg/dL   CBC     Status: Abnormal   Collection Time   04/14/12  5:40 AM      Component Value Range Comment   WBC 7.1  4.0 - 10.5 K/uL    RBC 3.81 (*) 4.22 - 5.81 MIL/uL    Hemoglobin 12.5 (*) 13.0 -  17.0 g/dL    HCT 45.4 (*) 09.8 - 52.0 %    MCV 96.6  78.0 - 100.0 fL    MCH 32.8  26.0 - 34.0 pg    MCHC 34.0  30.0 - 36.0 g/dL    RDW 11.9 (*) 14.7 - 15.5 %    Platelets 102 (*) 150 - 400 K/uL CONSISTENT WITH PREVIOUS RESULT  BASIC METABOLIC PANEL     Status: Abnormal   Collection Time   04/14/12  5:40 AM      Component Value Range Comment   Sodium 137  135 - 145 mEq/L    Potassium 4.3  3.5 - 5.1 mEq/L    Chloride 97  96 - 112 mEq/L    CO2 30  19 - 32 mEq/L    Glucose, Bld 211 (*) 70 - 99 mg/dL    BUN 36 (*) 6 - 23 mg/dL    Creatinine, Ser 8.29 (*) 0.50 - 1.35 mg/dL    Calcium 8.4  8.4 - 56.2 mg/dL    GFR calc non Af Amer 34 (*) >90 mL/min    GFR calc Af Amer 40 (*) >90 mL/min   GLUCOSE, CAPILLARY     Status: Abnormal   Collection Time   04/14/12  7:51 AM      Component Value Range Comment   Glucose-Capillary 201 (*) 70 - 99 mg/dL    Comment 1 Notify RN     GLUCOSE, CAPILLARY     Status: Abnormal   Collection Time   04/14/12 12:08 PM      Component Value Range Comment   Glucose-Capillary 187 (*) 70 - 99 mg/dL    Comment 1 Notify RN     BASIC METABOLIC PANEL     Status: Abnormal   Collection Time   04/14/12  4:48 PM      Component Value Range Comment   Sodium 134 (*) 135 - 145 mEq/L    Potassium 4.7  3.5 - 5.1 mEq/L    Chloride 96  96 - 112 mEq/L    CO2 30  19 - 32 mEq/L    Glucose, Bld 237 (*) 70 - 99 mg/dL    BUN 37 (*) 6 - 23 mg/dL    Creatinine, Ser 1.30 (*) 0.50 - 1.35 mg/dL    Calcium 8.3 (*) 8.4 - 10.5 mg/dL    GFR calc non Af Amer 32 (*) >90 mL/min    GFR calc Af Amer 37 (*) >90  mL/min   GLUCOSE, CAPILLARY     Status: Abnormal   Collection Time   04/14/12  5:03 PM      Component Value Range Comment   Glucose-Capillary 234 (*) 70 - 99 mg/dL    Comment 1 Notify RN     GLUCOSE, CAPILLARY     Status: Abnormal   Collection Time   04/14/12 10:00 PM      Component Value Range Comment   Glucose-Capillary 100 (*) 70 - 99 mg/dL   GLUCOSE, CAPILLARY     Status:  Abnormal   Collection Time   04/15/12  7:46 AM      Component Value Range Comment   Glucose-Capillary 131 (*) 70 - 99 mg/dL    Comment 1 Notify RN       Dg Chest 2 View  04/15/2012  *RADIOLOGY REPORT*  Clinical Data: Rib fractures.  Right chest pain.  CHEST - 2 VIEW  Comparison: 04/14/2012  Findings: Bibasilar atelectasis, similar to prior study.  Right rib fractures again noted.  No pneumothorax or pleural effusion.  Heart is normal size.  IMPRESSION: Bibasilar atelectasis.  No real change.   Original Report Authenticated By: Cyndie Chime, M.D.    Dg Chest Port 1 View  04/14/2012  *RADIOLOGY REPORT*  Clinical Data: Right-sided rib fractures  PORTABLE CHEST - 1 VIEW  Comparison: 04/12/2012; chest CT - 04/12/2012  Findings:  Grossly unchanged enlarged cardiac silhouette and mediastinal contours. Mild pulmonary venous congestion without frank evidence of edema.  Linear heterogeneous opacities within the left mid and lower lung are favored to represent atelectasis.  Minimal improved conspicuity of the pulmonary vasculature.  No definite pleural effusion or pneumothorax.  Known minimally displaced right-sided rib fractures are not well depicted on this AP portable examination.  Post lower cervical ACDF, incompletely imaged.  IMPRESSION: 1.  Known minimal displaced right-sided rib fractures are not well depicted on this AP portable exam. 2.  Minimally improved aeration of the lungs with persistent left basilar atelectasis.   Original Report Authenticated By: Waynard Reeds, M.D.     Review of Systems  Unable to perform ROS: unstable vital signs  The patient nods to basic questions but is unable to verbally respond to questions- He is tachycardic and on O2 via FM- he is visibly distressed.   Blood pressure 138/111, pulse 154, temperature 102.4 F (39.1 C), temperature source Oral, resp. rate 32, height 5\' 7"  (1.702 m), weight 92.987 kg (205 lb), SpO2 97.00%. Physical Exam  Nursing note and vitals  reviewed. Constitutional: He appears well-nourished. He appears distressed.       Restless on O2 via FM  HENT:  Head: Normocephalic and atraumatic.  Nose: Nose normal.       Unable to examine oropharynx with FM  Eyes: Conjunctivae normal and EOM are normal. Pupils are equal, round, and reactive to light. No scleral icterus.  Neck: Normal range of motion. No JVD present. No tracheal deviation present.  Cardiovascular: Exam reveals no gallop and no friction rub.   No murmur heard.      Regular tachycardia  Respiratory: Breath sounds normal. He is in respiratory distress. He exhibits tenderness.  GI: Soft. Bowel sounds are normal. He exhibits no distension and no mass. There is no tenderness. There is no rebound and no guarding.  Musculoskeletal: Normal range of motion. He exhibits edema.       Trace LE edema over ankles  Lymphadenopathy:    He has no cervical adenopathy.  Neurological:       Cannot assess due to distress  Skin: Skin is warm and dry. No rash noted. He is not diaphoretic. No erythema.  Psychiatric:       Anxious appearing    Assessment/Plan: 1. Acute renal failure on chronic kidney disease stage III: Suspected this is bifactorial from contrast-induced nephropathy-contrast exposure 3 days ago followed by rising creatinine and hemodynamically mediated with ongoing ACE inhibitor/diuretic/nonsteroidal anti-inflammatory drug therapy with relative episodes of hypotension and possible ischemic ATN. Fortunately, he appears to be having a fair urinary output and does not have any critical electrolyte problems at this time. I have discontinued his meloxicam, lisinopril, hydrochlorothiazide and furosemide at this time and go give him 24 hours of slow intravenous fluids. We'll send off for a renal ultrasound in order to rule out possible urinary tract obstruction, check urine electrolytes to corroborate clinical suspicion. Continue to avoid intravenous contrast and avoid further  nonsteroidal anti-inflammatory drug use as he recovers from his acute injury. Will check a creatinine kinase level to evaluate for rhabdomyolysis/pigment nephropathy (low likelihood). Urinalysis has been requested. 2. Respiratory distress: Unclear etiology however suspected to have pulmonary embolus, chest x-ray unyielding. Await lower extremity Dopplers to evaluate for DVT. Oxygen saturations better on O2 via facemask. No history suggestive of delirium tremens risk. Careful sedation used as needed. 3. Multiple rib fracture/soft tissue trauma following fall: Per trauma surgery. 4. Hypertension: Will discontinue lisinopril, hydrochlorothiazide and furosemide at this time-anticipate elevated blood pressures over the next 24 hours that we will manage with alternative agent such as hydralazine/clonidine.  Derell Bruun K. 04/15/2012, 11:35 AM

## 2012-04-15 NOTE — Progress Notes (Signed)
RN rounded on patient and observed that his respiratory status had worsened. Patient was SOB, with periods of holding his breath due to pain with coughing. Oxygen sats were 88% on 4L O2 via nasal cannula, HR was 140's. RN called Rapid Response RN, who responded and assisted with care. O2 increased to 6L via [place]. MD and PA at bedside. 0.5mg  dilaudid given to patient to decrease pain. HR 140's, BP 150's/80's, temp 102.5.  Decision was made to transfer patient to higher level of care. Once room was available, floor RN and RR RN transported patient on the monitor and O2 to ICU. Report was given at bedside to receiving RN.

## 2012-04-15 NOTE — Progress Notes (Signed)
Patient has worsened respiratory status.  Will transfer to ICU and begin nebs.  Continue aggressive pulmonary toilet.  Baseline COPD is an issue. CXR OK. Cannot do CT chest to R/O PE due to elevated CRT.  Renal to see.  Will check LE dopplers. I also spoke with his wife at the bedside. Patient examined and I agree with the assessment and plan  Violeta Gelinas, MD, MPH, FACS Pager: [phone]  04/15/2012 10:07 AM

## 2012-04-15 NOTE — Progress Notes (Signed)
Patient ID: Colton Carter, male   DOB: Apr 14, 1946, 66 y.o.   MRN: [id number]   LOS: 3 days   Subjective: Continues with severe right CW pain, pleuritic. Feels like he has some mucus he can't quite get up. Is due for MTX shot today, possibly for testosterone today.  Objective: Vital signs in last 24 hours: Temp:  [97.9 F (36.6 C)-99.2 F (37.3 C)] 99.2 F (37.3 C) (10/03 0651) Pulse Rate:  [85-91] 87  (10/03 0651) Resp:  [16-19] 16  (10/03 0651) BP: (91-126)/(51-63) 126/63 mmHg (10/03 0651) SpO2:  [87 %-97 %] 87 % (10/03 0651) Last BM Date: 04/12/12   IS:   Lab Results:  CBC  Basename 04/14/12 0540 04/13/12 0645  WBC 7.1 6.5  HGB 12.5* 12.8*  HCT 36.8* 37.8*  PLT 102* 101*   BMET  Basename 04/14/12 1648 04/14/12 0540  NA 134* 137  K 4.7 4.3  CL 96 97  CO2 30 30  GLUCOSE 237* 211*  BUN 37* 36*  CREATININE 2.07* 1.95*  CALCIUM 8.3* 8.4   CBG (last 3)   Basename 04/14/12 2200 04/14/12 1703 04/14/12 1208  GLUCAP 100* 234* 187*     General appearance: alert and no distress Resp: rhonchi anterior - left Cardio: regular rate and rhythm GI: normal findings: bowel sounds normal and soft, non-tender   Assessment/Plan: Fall  R rib Fx 4-8 - Will get CXR with worsened exam, fxn. Change pain med to oxycodone. L knee abrasion  IDDM - Improved last night CRF -- Has a long history of this. Sees nephrologist in [place]. Unsure of baseline, pt says it fluctuates often. Will contact his nephrologist for guidance. FEN - No issues, no NSAID with CRF.  VTE - lovenox, SCD's  Dispo -- Possibly home today depending on CXR, nephrologist.      Freeman Caldron, PA-C Pager: [phone] General Trauma PA Pager: [phone]   04/15/2012

## 2012-04-15 NOTE — Progress Notes (Signed)
VASCULAR LAB PRELIMINARY  PRELIMINARY  PRELIMINARY  PRELIMINARY  Bilateral lower extremity venous duplex completed.    Preliminary report:  Bilateral:  No evidence of DVT, superficial thrombosis, or Baker's Cyst.   Sherald Balbuena, RVS 04/15/2012, 6:25 PM

## 2012-04-15 NOTE — Progress Notes (Signed)
ANTIBIOTIC CONSULT NOTE - INITIAL  Pharmacy Consult for vancomycin, zosyn Indication: rule out pneumonia  No Known Allergies  Patient Measurements: Height: 5\' 7"  (170.2 cm) Weight: 205 lb (92.987 kg) IBW/kg (Calculated) : 66.1   Vital Signs: Temp: 102.4 F (39.1 C) (10/03 1007) Temp src: Oral (10/03 1007) BP: 107/75 mmHg (10/03 1300) Pulse Rate: 133  (10/03 1300) Intake/Output from previous day: 10/02 0701 - 10/03 0700 In: 1897.9 [P.O.:480; I.V.:1417.9] Out: -  Intake/Output from this shift: Total I/O In: 23.7 [I.V.:21.7; IV Piggyback:2] Out: 525 [Urine:525]  Labs:  [facility] 04/15/12 1255 04/15/12 1254 04/15/12 1143 04/14/12 1648 04/14/12 0540 04/13/12 0645  WBC -- 14.7* -- -- 7.1 6.5  HGB -- 14.0 -- -- 12.5* 12.8*  PLT -- 120* -- -- 102* 101*  LABCREA -- -- 60.09 -- -- --  CREATININE 1.63* -- -- 2.07* 1.95* --   Estimated Creatinine Clearance: 49.1 ml/min (by C-G formula based on Cr of 1.63). No results found for this basename: VANCOTROUGH:2,VANCOPEAK:2,VANCORANDOM:2,GENTTROUGH:2,GENTPEAK:2,GENTRANDOM:2,TOBRATROUGH:2,TOBRAPEAK:2,TOBRARND:2,AMIKACINPEAK:2,AMIKACINTROU:2,AMIKACIN:2, in the last 72 hours   Microbiology: Recent Results (from the past 720 hour(s))  MRSA PCR SCREENING     Status: Normal   Collection Time   04/13/12  5:39 AM      Component Value Range Status Comment   MRSA by PCR NEGATIVE  NEGATIVE Final   MRSA PCR SCREENING     Status: Normal   Collection Time   04/13/12  9:50 AM      Component Value Range Status Comment   MRSA by PCR NEGATIVE  NEGATIVE Final     Medical History: Past Medical History  Diagnosis Date  . Coronary artery disease   . Diabetes mellitus   . Hypertension   . Arthritis   . High cholesterol   . Renal disorder   . Chronic neck pain     Medications:  Prescriptions prior to admission  Medication Sig Dispense Refill  . ALPRAZolam (XANAX) 0.25 MG tablet Take 0.25-0.5 mg by mouth 3 (three) times daily. Takes 2 tabs in  am, 1 at lunch and 1 at bedtime      . atenolol (TENORMIN) 25 MG tablet Take 25 mg by mouth daily.      . cyclobenzaprine (FLEXERIL) 10 MG tablet Take 10 mg by mouth 2 (two) times daily.      . febuxostat (ULORIC) 40 MG tablet Take 40-80 mg by mouth See admin instructions. Take 2 tablets on one day then takes 1 tablet      . Fluticasone-Salmeterol (ADVAIR) 250-50 MCG/DOSE AEPB Inhale 1 puff into the lungs every 12 (twelve) hours.      . furosemide (LASIX) 40 MG tablet Take 40 mg by mouth 2 (two) times daily.      Marland Kitchen gabapentin (NEURONTIN) 600 MG tablet Take 600 mg by mouth 3 (three) times daily.      Marland Kitchen HYDROcodone-acetaminophen (LORTAB) 10-500 MG per tablet Take 1 tablet by mouth every 5 (five) hours as needed.      . insulin aspart (NOVOLOG) 100 UNIT/ML injection Inject 5-15 Units into the skin 3 (three) times daily before meals. SS 100-150 = 5 units 151-200 = 7 units 201-250 = 9 units 251-300 =11 units 301-350 = 13 units 351-400 = 15 units      . insulin glargine (LANTUS) 100 UNIT/ML injection Inject 75 Units into the skin 2 (two) times daily.       Marland Kitchen lisinopril-hydrochlorothiazide (PRINZIDE,ZESTORETIC) 20-25 MG per tablet Take 1 tablet by mouth daily.      Marland Kitchen  meloxicam (MOBIC) 15 MG tablet Take 15 mg by mouth daily.      . methotrexate 1 G injection Inject into the vein every 7 (seven) days. 25mg /ml. For arthritis      . montelukast (SINGULAIR) 10 MG tablet Take 10 mg by mouth daily.      Marland Kitchen omeprazole (PRILOSEC) 20 MG capsule Take 20 mg by mouth daily.      . simvastatin (ZOCOR) 40 MG tablet Take 40 mg by mouth daily.      Marland Kitchen testosterone cypionate (DEPOTESTOTERONE CYPIONATE) 200 MG/ML injection Inject 200 mg into the muscle every 14 (fourteen) days.      Marland Kitchen tiotropium (SPIRIVA) 18 MCG inhalation capsule Place 18 mcg into inhaler and inhale daily.       Assessment: 66 yo man s/p fall with multiple rib fx transferred to ICU for respiratory distress.  He will start broad spectrum Abx r/o PNA.   He has acute on CRF.  CrCl ~49 ml/min  Goal of Therapy:  Vancomycin trough level 15-20 mcg/ml  Plan:  Zosyn 3.375 gm IV q8 hours. Vancomycin 1250 mg IV q24 hours. F/u renal function, cultures, and clinical progress. Check vanc trough when appropriate.   Eustolia Drennen Poteet 04/15/2012,2:04 PM

## 2012-04-16 LAB — GLUCOSE, CAPILLARY
Glucose-Capillary: 189 mg/dL — ABNORMAL HIGH (ref 70–99)
Glucose-Capillary: 89 mg/dL (ref 70–99)

## 2012-04-16 LAB — RENAL FUNCTION PANEL
Albumin: 3 g/dL — ABNORMAL LOW (ref 3.5–5.2)
BUN: 48 mg/dL — ABNORMAL HIGH (ref 6–23)
Calcium: 8.2 mg/dL — ABNORMAL LOW (ref 8.4–10.5)
Creatinine, Ser: 2.03 mg/dL — ABNORMAL HIGH (ref 0.50–1.35)
Glucose, Bld: 195 mg/dL — ABNORMAL HIGH (ref 70–99)
Phosphorus: 3.6 mg/dL (ref 2.3–4.6)

## 2012-04-16 LAB — EXPECTORATED SPUTUM ASSESSMENT W GRAM STAIN, RFLX TO RESP C

## 2012-04-16 MED ORDER — SODIUM CHLORIDE 0.9 % IV SOLN
500.0000 mL | Freq: Once | INTRAVENOUS | Status: AC
  Administered 2012-04-16: 500 mL via INTRAVENOUS

## 2012-04-16 MED ORDER — GABAPENTIN 600 MG PO TABS
600.0000 mg | ORAL_TABLET | Freq: Two times a day (BID) | ORAL | Status: DC
  Administered 2012-04-16 – 2012-04-19 (×8): 600 mg via ORAL
  Filled 2012-04-16 (×10): qty 1

## 2012-04-16 NOTE — Progress Notes (Signed)
UR completed 

## 2012-04-16 NOTE — Progress Notes (Signed)
Trauma Service Note  Subjective: The patient looks considerably better.  Has a very rattling cough, starting to become productive.  Pain is much better controlled.  Objective: Vital signs in last 24 hours: Temp:  [98.2 F (36.8 C)-102.4 F (39.1 C)] 98.7 F (37.1 C) (10/04 0334) Pulse Rate:  [68-154] 81  (10/04 0700) Resp:  [11-33] 13  (10/04 0700) BP: (80-159)/(39-111) 94/54 mmHg (10/04 0700) SpO2:  [88 %-100 %] 100 % (10/04 0700) Weight:  [94.1 kg (207 lb 7.3 oz)] 94.1 kg (207 lb 7.3 oz) (10/04 0400) Last BM Date: 04/12/12  Intake/Output from previous day: 10/03 0701 - 10/04 0700 In: 2508 [P.O.:480; I.V.:1926; IV Piggyback:102] Out: 1800 [Urine:1800] Intake/Output this shift:    General: No acute distress  Lungs: Coarse breath sounds bilaterally.  No wheezing.  Abd: Soft, good bowel sounds  Extremities: No DVT signs or symptoms.  Preliminary duplex report does not indicate DVT.  Neuro: Intact  Lab Results: CBC   Basename 04/15/12 1254 04/14/12 0540  WBC 14.7* 7.1  HGB 14.0 12.5*  HCT 40.8 36.8*  PLT 120* 102*   BMET  Basename 04/15/12 1255 04/14/12 1648  NA 133* 134*  K 5.1 4.7  CL 95* 96  CO2 27 30  GLUCOSE 156* 237*  BUN 36* 37*  CREATININE 1.63* 2.07*  CALCIUM 8.5 8.3*   PT/INR No results found for this basename: LABPROT:2,INR:2 in the last 72 hours ABG No results found for this basename: PHART:2,PCO2:2,PO2:2,HCO3:2 in the last 72 hours  Studies/Results: Dg Chest 2 View  04/15/2012  *RADIOLOGY REPORT*  Clinical Data: Rib fractures.  Right chest pain.  CHEST - 2 VIEW  Comparison: 04/14/2012  Findings: Bibasilar atelectasis, similar to prior study.  Right rib fractures again noted.  No pneumothorax or pleural effusion.  Heart is normal size.  IMPRESSION: Bibasilar atelectasis.  No real change.   Original Report Authenticated By: Cyndie Chime, M.D.    US Renal Port  04/15/2012  *RADIOLOGY REPORT*  Clinical Data: 66 year old male with acute renal  failure on stage III chronic kidney disease.  RENAL/URINARY TRACT ULTRASOUND COMPLETE  Comparison:  CT abdomen and pelvis 04/12/2012.  Findings:  Right Kidney:  No hydronephrosis.  Renal length 11.4 cm.  Renal cortical thinning at the poles.  Renal cortical echogenicity is preserved.  Left Kidney:  No hydronephrosis.  Renal length 10.6 cm.  Renal cortical thinning at the poles.  Cortical echogenicity within normal limits.  Bladder:  Not visible, Foley catheter reportedly in place.  IMPRESSION: No acute renal findings.   Original Report Authenticated By: Harley Hallmark, M.D.     Anti-infectives: Anti-infectives     Start     Dose/Rate Route Frequency Ordered Stop   04/15/12 1430   vancomycin (VANCOCIN) 1,250 mg in sodium chloride 0.9 % 250 mL IVPB        1,250 mg 166.7 mL/hr over 90 Minutes Intravenous Every 24 hours 04/15/12 1357     04/15/12 1430  piperacillin-tazobactam (ZOSYN) IVPB 3.375 g       3.375 g 12.5 mL/hr over 240 Minutes Intravenous 3 times per day 04/15/12 1358            Assessment/Plan: s/p  d/c foley Advance diet Clear liquids only. Physical therapy  LOS: 4 days   Marta Lamas. Gae Bon, MD, FACS [phone] Trauma Surgeon 04/16/2012

## 2012-04-16 NOTE — Progress Notes (Signed)
Patient ID: Colton Carter, male   DOB: 07/11/1946, 66 y.o.   MRN: [id number]    [facility] Progress Note    Subjective:   Reports to be feeling better today with improved pain control and resolution of his dyspnea.   Objective:   BP 94/54  Pulse 81  Temp 98.7 F (37.1 C) (Axillary)  Resp 13  Ht 5\' 7"  (1.702 m)  Wt 94.1 kg (207 lb 7.3 oz)  BMI 32.49 kg/m2  SpO2 100%  Intake/Output Summary (Last 24 hours) at 04/16/12 4098 Last data filed at 04/16/12 0700  Gross per 24 hour  Intake   2508 ml  Output   1800 ml  Net    708 ml   Weight change:   Physical Exam: JXB:[id number] resting in bed- engages in conversation YQM:VHQIO RRR, normal S1 and S2  Resp:Decreased BS right base- otherwise CTA NGE:XBMW, obese, NT, BS normal UXL:KGMWN LE edema  Imaging: Dg Chest 2 View  04/15/2012  *RADIOLOGY REPORT*  Clinical Data: Rib fractures.  Right chest pain.  CHEST - 2 VIEW  Comparison: 04/14/2012  Findings: Bibasilar atelectasis, similar to prior study.  Right rib fractures again noted.  No pneumothorax or pleural effusion.  Heart is normal size.  IMPRESSION: Bibasilar atelectasis.  No real change.   Original Report Authenticated By: Cyndie Chime, M.D.    US Renal Port  04/15/2012  *RADIOLOGY REPORT*  Clinical Data: 66 year old male with acute renal failure on stage III chronic kidney disease.  RENAL/URINARY TRACT ULTRASOUND COMPLETE  Comparison:  CT abdomen and pelvis 04/12/2012.  Findings:  Right Kidney:  No hydronephrosis.  Renal length 11.4 cm.  Renal cortical thinning at the poles.  Renal cortical echogenicity is preserved.  Left Kidney:  No hydronephrosis.  Renal length 10.6 cm.  Renal cortical thinning at the poles.  Cortical echogenicity within normal limits.  Bladder:  Not visible, Foley catheter reportedly in place.  IMPRESSION: No acute renal findings.   Original Report Authenticated By: Harley Hallmark, M.D.    Dg Chest Port 1 View  04/14/2012  *RADIOLOGY REPORT*   Clinical Data: Right-sided rib fractures  PORTABLE CHEST - 1 VIEW  Comparison: 04/12/2012; chest CT - 04/12/2012  Findings:  Grossly unchanged enlarged cardiac silhouette and mediastinal contours. Mild pulmonary venous congestion without frank evidence of edema.  Linear heterogeneous opacities within the left mid and lower lung are favored to represent atelectasis.  Minimal improved conspicuity of the pulmonary vasculature.  No definite pleural effusion or pneumothorax.  Known minimally displaced right-sided rib fractures are not well depicted on this AP portable examination.  Post lower cervical ACDF, incompletely imaged.  IMPRESSION: 1.  Known minimal displaced right-sided rib fractures are not well depicted on this AP portable exam. 2.  Minimally improved aeration of the lungs with persistent left basilar atelectasis.   Original Report Authenticated By: Waynard Reeds, M.D.     Labs: BMET  Lab 04/15/12 1255 04/14/12 1648 04/14/12 0540 04/13/12 0645 04/12/12 2156 04/12/12 1508  NA 133* 134* 137 140 -- 144  K 5.1 4.7 4.3 4.2 -- 4.3  CL 95* 96 97 100 -- 102  CO2 27 30 30 31  -- --  GLUCOSE 156* 237* 211* 134* -- 87  BUN 36* 37* 36* 27* -- 30*  CREATININE 1.63* 2.07* 1.95* 1.74* 1.69* 1.80*  ALB -- -- -- -- -- --  CALCIUM 8.5 8.3* 8.4 8.3* -- --  PHOS -- -- -- -- -- --   CBC  Lab 04/15/12 1254 04/14/12 0540 04/13/12 0645 04/12/12 2156 04/12/12 1451  WBC 14.7* 7.1 6.5 8.5 --  NEUTROABS -- -- -- -- 5.9  HGB 14.0 12.5* 12.8* 13.6 --  HCT 40.8 36.8* 37.8* 40.5 --  MCV 94.4 96.6 96.2 96.9 --  PLT 120* 102* 101* 114* --    Medications:      . albuterol  2.5 mg Nebulization Q4H  . ALPRAZolam  0.25-0.5 mg Oral TID  . atenolol  25 mg Oral Daily  . cyclobenzaprine  10 mg Oral BID  . docusate sodium  100 mg Oral BID  . enoxaparin  30 mg Subcutaneous Q24H  . febuxostat  40 mg Oral QODAY  . febuxostat  80 mg Oral QODAY  . Fluticasone-Salmeterol  1 puff Inhalation Q12H  . gabapentin  600 mg  Oral TID  . HYDROmorphone PCA 0.3 mg/mL   Intravenous Q4H  . insulin aspart  0-20 Units Subcutaneous TID WC  . insulin aspart  0-5 Units Subcutaneous QHS  . insulin glargine  75 Units Subcutaneous BID  . ipratropium  0.5 mg Nebulization Q4H  . LORazepam      . LORazepam  1 mg Intravenous Once  . metoprolol  5 mg Intravenous Q6H  . montelukast  10 mg Oral Daily  . pantoprazole  40 mg Oral Q1200  . piperacillin-tazobactam (ZOSYN)  IV  3.375 g Intravenous Q8H  . simvastatin  40 mg Oral Daily  . sodium chloride  3 mL Intravenous Q12H  . testosterone cypionate  200 mg Intramuscular Q14 Days  . traMADol  50 mg Oral Q6H  . vancomycin  1,250 mg Intravenous Q24H  . DISCONTD: furosemide  40 mg Oral BID  . DISCONTD: hydrochlorothiazide  25 mg Oral Daily  . DISCONTD: lisinopril  20 mg Oral Daily  . DISCONTD: meloxicam  15 mg Oral Daily  . DISCONTD: tiotropium  18 mcg Inhalation Daily     Assessment/ Plan:   1. 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 Non-oliguric and with improving renal function. I have discontinued his meloxicam, lisinopril, hydrochlorothiazide and furosemide and his fluids will be discontinued later this morning. Renal ultrasound negative for urinary tract obstruction. Continue to avoid intravenous contrast and avoid further nonsteroidal anti-inflammatory drug use as he recovers from his acute injury.  Plan to DC foley today if okay from trauma surgery standpoint.  2. Respiratory distress: Improved symptoms noted with resolution of distress- LE dopplers negative for DVT. 3. Multiple rib fracture/soft tissue trauma following fall: Per trauma surgery.  4. Hypertension: In spite of discontinuing anti-HTN meds, hypotensive on PO atenolol/ IV metoprolol- DC IV lopressor   Zetta Bills,  MD 04/16/2012, 7:28 AM

## 2012-04-16 NOTE — Evaluation (Signed)
Physical Therapy Evaluation Patient Details Name: Colton Carter MRN: [id number] DOB: [birth year] Today's Date: 04/16/2012 Time: 1308-6578 PT Time Calculation (min): 26 min  PT Assessment / Plan / Recommendation Clinical Impression  Pt adm with rt rib fx's after fall from ladder into an empty pool.  Originally eval'd and dc'd from PT on 04/14/12 due to pt independent.  Pt transferred to ICU with respiratory distress and agitation.  Pt now requiring assist for mobility and needs skilled PT to maximize I and safety so pt can return home with family.    PT Assessment  Patient needs continued PT services    Follow Up Recommendations  No PT follow up    Barriers to Discharge        Equipment Recommendations  None recommended by PT    Recommendations for Other Services     Frequency Min 3X/week    Precautions / Restrictions Precautions Precautions: Fall Restrictions Weight Bearing Restrictions: No   Pertinent Vitals/Pain Pain 10/10 in rt ribs with mobility      Mobility  Bed Mobility Bed Mobility: Supine to Sit;Sitting - Scoot to Edge of Bed Supine to Sit: 4: Min assist Sitting - Scoot to [place] of Bed: 4: Min assist Details for Bed Mobility Assistance: assist to bring trunk up Transfers Sit to Stand: 4: Min guard;With upper extremity assist;From bed Stand to Sit: 4: Min assist;With upper extremity assist;With armrests;To chair/3-in-1 Details for Transfer Assistance: Assist to control descent Ambulation/Gait Ambulation/Gait Assistance: 4: Min guard Ambulation Distance (Feet): 150 Feet Assistive device: Other (Comment) (pushing w/c) Ambulation/Gait Assistance Details: Pt pushing w/c with LUE and holding RUE at side due to pain Gait Pattern: Step-through pattern;Decreased stride length    Shoulder Instructions     Exercises     PT Diagnosis: Acute pain;Difficulty walking  PT Problem List: Decreased activity tolerance;Decreased mobility;Pain PT Treatment Interventions: DME  instruction;Gait training;Functional mobility training;Therapeutic activities;Therapeutic exercise;Balance training;Patient/family education   PT Goals Acute Rehab PT Goals PT Goal Formulation: With patient Time For Goal Achievement: 04/23/12 Potential to Achieve Goals: Good Pt will go Supine/Side to Sit: with modified independence PT Goal: Supine/Side to Sit - Progress: Goal set today Pt will go Sit to Supine/Side: with modified independence PT Goal: Sit to Supine/Side - Progress: Goal set today Pt will go Sit to Stand: with modified independence PT Goal: Sit to Stand - Progress: Goal set today Pt will go Stand to Sit: with modified independence PT Goal: Stand to Sit - Progress: Goal set today Pt will Ambulate: >150 feet;with modified independence PT Goal: Ambulate - Progress: Goal set today  Visit Information  Last PT Received On: 04/16/12 Assistance Needed: +1    Subjective Data  Subjective: "Where's my pillow?" pt asked about his pillow he holds to splint his ribs when he coughs or moves.   Prior Functioning  Home Living Lives With: Significant other Available Help at Discharge: Family;Available 24 hours/day Prior Function Level of Independence: Independent Able to Take Stairs?: Reciprically Driving: Yes Vocation: On disability Communication Communication: No difficulties    Cognition  Overall Cognitive Status: Appears within functional limits for tasks assessed/performed Arousal/Alertness: Awake/alert Orientation Level: Appears intact for tasks assessed Behavior During Session: [facility] for tasks performed    Extremity/Trunk Assessment Right Lower Extremity Assessment RLE ROM/Strength/Tone: [facility] for tasks assessed Left Lower Extremity Assessment LLE ROM/Strength/Tone: [facility] for tasks assessed   Balance Static Standing Balance Static Standing - Balance Support: No upper extremity supported Static Standing - Level of Assistance: 5: Stand by  assistance  End of Session PT -  End of Session Activity Tolerance: Patient tolerated treatment well Patient left: in chair;with call bell/phone within reach Nurse Communication: Mobility status  GP     [facility] 04/16/2012, 10:38 AM  [facility] PT [phone]

## 2012-04-17 LAB — GLUCOSE, CAPILLARY
Glucose-Capillary: 76 mg/dL (ref 70–99)
Glucose-Capillary: 78 mg/dL (ref 70–99)
Glucose-Capillary: 114 mg/dL — ABNORMAL HIGH (ref 70–99)
Glucose-Capillary: 122 mg/dL — ABNORMAL HIGH (ref 70–99)

## 2012-04-17 LAB — RENAL FUNCTION PANEL
CO2: 26 mEq/L (ref 19–32)
Calcium: 8.3 mg/dL — ABNORMAL LOW (ref 8.4–10.5)
GFR calc Af Amer: 56 mL/min — ABNORMAL LOW (ref >90)
GFR calc non Af Amer: 48 mL/min — ABNORMAL LOW (ref >90)
Glucose, Bld: 124 mg/dL — ABNORMAL HIGH (ref 70–99)
Phosphorus: 2.6 mg/dL (ref 2.3–4.6)
Potassium: 4.1 mEq/L (ref 3.5–5.1)
Sodium: 139 mEq/L (ref 135–145)

## 2012-04-17 MED ORDER — IPRATROPIUM BROMIDE 0.02 % IN SOLN
0.5000 mg | Freq: Four times a day (QID) | RESPIRATORY_TRACT | Status: DC
  Administered 2012-04-18 – 2012-04-19 (×5): 0.5 mg via RESPIRATORY_TRACT
  Filled 2012-04-17 (×6): qty 2.5

## 2012-04-17 MED ORDER — DEXTROSE 50 % IV SOLN
25.0000 mL | Freq: Once | INTRAVENOUS | Status: AC | PRN
  Administered 2012-04-17: 25 mL via INTRAVENOUS
  Filled 2012-04-17: qty 50

## 2012-04-17 MED ORDER — ALBUTEROL SULFATE (5 MG/ML) 0.5% IN NEBU
2.5000 mg | INHALATION_SOLUTION | Freq: Four times a day (QID) | RESPIRATORY_TRACT | Status: DC
  Administered 2012-04-18 – 2012-04-19 (×5): 2.5 mg via RESPIRATORY_TRACT
  Filled 2012-04-17 (×6): qty 0.5

## 2012-04-17 MED ORDER — INSULIN GLARGINE 100 UNIT/ML ~~LOC~~ SOLN
30.0000 [IU] | Freq: Two times a day (BID) | SUBCUTANEOUS | Status: DC
  Administered 2012-04-17 – 2012-04-19 (×5): 30 [IU] via SUBCUTANEOUS

## 2012-04-17 NOTE — Progress Notes (Signed)
Hypoglycemic Event  CBG: 42  Treatment: apple juice  Symptoms: lethargic   Follow-up CBG: Time :0417 CBG Result: 57  Possible Reasons for Event:   Comments/MD notified Pt. On clear liquid diet, 25ml D50 given per protocol see note     Colton Carter, [name]  Remember to initiate Hypoglycemia Order Set & complete

## 2012-04-17 NOTE — Progress Notes (Signed)
  Subjective: Complains of rib pain on right. Awake and alert. Looking more comfortable. This am cbg in 40's  Objective: Vital signs in last 24 hours: Temp:  [97.3 F (36.3 C)-98.4 F (36.9 C)] 98 F (36.7 C) (10/05 0721) Pulse Rate:  [82-109] 89  (10/05 0700) Resp:  [15-29] 15  (10/05 0700) BP: (83-139)/(42-94) 98/66 mmHg (10/05 0700) SpO2:  [2 %-100 %] 99 % (10/05 0700) Weight:  [207 lb 14.4 oz (94.303 kg)] 207 lb 14.4 oz (94.303 kg) (10/05 0600) Last BM Date: 04/12/12  Intake/Output from previous day: 10/04 0701 - 10/05 0700 In: 4288.4 [P.O.:1460; I.V.:2415.9; IV Piggyback:412.5] Out: 3400 [Urine:3400] Intake/Output this shift:    Resp: clear to auscultation bilaterally GI: soft, nontender. good bs and some flatus  Lab Results:   Basename 04/15/12 1254  WBC 14.7*  HGB 14.0  HCT 40.8  PLT 120*   BMET  Basename 04/17/12 0445 04/16/12 1014  NA 139 135  K 4.1 4.8  CL 104 98  CO2 26 28  GLUCOSE 124* 195*  BUN 35* 48*  CREATININE 1.47* 2.03*  CALCIUM 8.3* 8.2*   PT/INR No results found for this basename: LABPROT:2,INR:2 in the last 72 hours ABG No results found for this basename: PHART:2,PCO2:2,PO2:2,HCO3:2 in the last 72 hours  Studies/Results: Dg Chest 2 View  04/15/2012  *RADIOLOGY REPORT*  Clinical Data: Rib fractures.  Right chest pain.  CHEST - 2 VIEW  Comparison: 04/14/2012  Findings: Bibasilar atelectasis, similar to prior study.  Right rib fractures again noted.  No pneumothorax or pleural effusion.  Heart is normal size.  IMPRESSION: Bibasilar atelectasis.  No real change.   Original Report Authenticated By: Cyndie Chime, M.D.    US Renal Port  04/15/2012  *RADIOLOGY REPORT*  Clinical Data: 66 year old male with acute renal failure on stage III chronic kidney disease.  RENAL/URINARY TRACT ULTRASOUND COMPLETE  Comparison:  CT abdomen and pelvis 04/12/2012.  Findings:  Right Kidney:  No hydronephrosis.  Renal length 11.4 cm.  Renal cortical thinning at  the poles.  Renal cortical echogenicity is preserved.  Left Kidney:  No hydronephrosis.  Renal length 10.6 cm.  Renal cortical thinning at the poles.  Cortical echogenicity within normal limits.  Bladder:  Not visible, Foley catheter reportedly in place.  IMPRESSION: No acute renal findings.   Original Report Authenticated By: Harley Hallmark, M.D.     Anti-infectives: Anti-infectives     Start     Dose/Rate Route Frequency Ordered Stop   04/15/12 1430   vancomycin (VANCOCIN) 1,250 mg in sodium chloride 0.9 % 250 mL IVPB        1,250 mg 166.7 mL/hr over 90 Minutes Intravenous Every 24 hours 04/15/12 1357     04/15/12 1430   piperacillin-tazobactam (ZOSYN) IVPB 3.375 g        3.375 g 12.5 mL/hr over 240 Minutes Intravenous 3 times per day 04/15/12 1358            Assessment/Plan: s/p * No surgery found * Continue clears Vanc and Zosyn for pneumonia Hold Lantus dose and decrease by half for low cbg OOB pulm toilet  LOS: 5 days    TOTH III,Krissa Utke S 04/17/2012

## 2012-04-17 NOTE — Progress Notes (Signed)
Hypoglycemic Event  CBG: 57  Treatment: 25 ml D50 given per protocol  Symptoms: lethargic  Follow-up CBG: Time: 0445 CBG Result:124  Possible Reasons for Event:   Comments/MD notified: Will continue to monitor pt.     Kourtnei Rauber, [name]  Remember to initiate Hypoglycemia Order Set & complete

## 2012-04-17 NOTE — Progress Notes (Signed)
Patient ID: Colton Carter, male   DOB: 1945-07-24, 66 y.o.   MRN: [id number]   [facility] Progress Note    Subjective:   Symptomatic hyperglycemia noted overnight, managed with oral measures. Currently voices no shortness of breath and has well-controlled right-sided chest pain.    Objective:   BP 98/66  Pulse 89  Temp 98.1 F (36.7 C) (Oral)  Resp 15  Ht 5\' 7"  (1.702 m)  Wt 94.303 kg (207 lb 14.4 oz)  BMI 32.56 kg/m2  SpO2 99%  Intake/Output Summary (Last 24 hours) at 04/17/12 0720 Last data filed at 04/17/12 0700  Gross per 24 hour  Intake 4288.36 ml  Output   3400 ml  Net 888.36 ml   Weight change: 0.203 kg (7.2 oz)  Physical Exam: Gen: Comfortable, resting with oxygen via nasal cannula. Watching television CVS: Pulse regular in rate and rhythm, heart sounds S1 and S2 normal Resp: Splinting of right chest, breath sounds diminished over the right base. Left lung with coarse breath sounds  Abd: Soft, obese, nontender and bowel sounds are normal Ext: Trace ankle/lower leg edema  Imaging: Dg Chest 2 View  04/15/2012  *RADIOLOGY REPORT*  Clinical Data: Rib fractures.  Right chest pain.  CHEST - 2 VIEW  Comparison: 04/14/2012  Findings: Bibasilar atelectasis, similar to prior study.  Right rib fractures again noted.  No pneumothorax or pleural effusion.  Heart is normal size.  IMPRESSION: Bibasilar atelectasis.  No real change.   Original Report Authenticated By: Cyndie Chime, M.D.    US Renal Port  04/15/2012  *RADIOLOGY REPORT*  Clinical Data: 66 year old male with acute renal failure on stage III chronic kidney disease.  RENAL/URINARY TRACT ULTRASOUND COMPLETE  Comparison:  CT abdomen and pelvis 04/12/2012.  Findings:  Right Kidney:  No hydronephrosis.  Renal length 11.4 cm.  Renal cortical thinning at the poles.  Renal cortical echogenicity is preserved.  Left Kidney:  No hydronephrosis.  Renal length 10.6 cm.  Renal cortical thinning at the poles.  Cortical  echogenicity within normal limits.  Bladder:  Not visible, Foley catheter reportedly in place.  IMPRESSION: No acute renal findings.   Original Report Authenticated By: Ulla Potash III, M.D.     Labs: BMET  Lab 04/17/12 0445 04/16/12 1014 04/15/12 1255 04/14/12 1648 04/14/12 0540 04/13/12 0645 04/12/12 2156 04/12/12 1508  NA 139 135 133* 134* 137 140 -- 144  K 4.1 4.8 5.1 4.7 4.3 4.2 -- 4.3  CL 104 98 95* 96 97 100 -- 102  CO2 26 28 27 30 30 31  -- --  GLUCOSE 124* 195* 156* 237* 211* 134* -- 87  BUN 35* 48* 36* 37* 36* 27* -- 30*  CREATININE 1.47* 2.03* 1.63* 2.07* 1.95* 1.74* 1.69* --  ALB -- -- -- -- -- -- -- --  CALCIUM 8.3* 8.2* 8.5 8.3* 8.4 8.3* -- --  PHOS 2.6 3.6 -- -- -- -- -- --   CBC  Lab 04/15/12 1254 04/14/12 0540 04/13/12 0645 04/12/12 2156 04/12/12 1451  WBC 14.7* 7.1 6.5 8.5 --  NEUTROABS -- -- -- -- 5.9  HGB 14.0 12.5* 12.8* 13.6 --  HCT 40.8 36.8* 37.8* 40.5 --  MCV 94.4 96.6 96.2 96.9 --  PLT 120* 102* 101* 114* --    Medications:      . sodium chloride  500 mL Intravenous Once  . albuterol  2.5 mg Nebulization Q4H  . ALPRAZolam  0.25-0.5 mg Oral TID  . atenolol  25 mg Oral  Daily  . cyclobenzaprine  10 mg Oral BID  . docusate sodium  100 mg Oral BID  . enoxaparin  30 mg Subcutaneous Q24H  . febuxostat  40 mg Oral QODAY  . febuxostat  80 mg Oral QODAY  . Fluticasone-Salmeterol  1 puff Inhalation Q12H  . gabapentin  600 mg Oral BID  . HYDROmorphone PCA 0.3 mg/mL   Intravenous Q4H  . insulin aspart  0-20 Units Subcutaneous TID WC  . insulin aspart  0-5 Units Subcutaneous QHS  . insulin glargine  75 Units Subcutaneous BID  . ipratropium  0.5 mg Nebulization Q4H  . montelukast  10 mg Oral Daily  . pantoprazole  40 mg Oral Q1200  . piperacillin-tazobactam (ZOSYN)  IV  3.375 g Intravenous Q8H  . simvastatin  40 mg Oral Daily  . sodium chloride  3 mL Intravenous Q12H  . testosterone cypionate  200 mg Intramuscular Q14 Days  . traMADol  50 mg Oral Q6H  .  vancomycin  1,250 mg Intravenous Q24H  . DISCONTD: gabapentin  600 mg Oral TID  . DISCONTD: metoprolol  5 mg Intravenous Q6H     Assessment/ Plan:   1. 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 He remains non-oliguric and with improving renal function. Foley catheter discontinued yesterday without any significant retention. 2. Respiratory distress: Improved symptoms noted with resolution of distress- LE dopplers negative for DVT.  3. Multiple rib fracture/soft tissue trauma following fall: Per trauma surgery.  4. Hypertension: In spite of discontinuing anti-HTN meds, episodic hypotension noted on PO atenolol  Restart lisinopril and hydrochlorothiazide prior to discharge and when hemodynamically more stable. Do not restart meloxicam. Refer furosemide restarting to his primary care provider/nephrologist (unless he has obvious/worsening edema). Given limited input, we'll sign off-please call if further questions/concerns arise.   Zetta Bills, MD 04/17/2012, 7:20 AM

## 2012-04-18 LAB — RENAL FUNCTION PANEL
Albumin: 3.1 g/dL — ABNORMAL LOW (ref 3.5–5.2)
CO2: 26 mEq/L (ref 19–32)
Chloride: 102 mEq/L (ref 96–112)
GFR calc non Af Amer: 56 mL/min — ABNORMAL LOW (ref >90)
Potassium: 4.4 mEq/L (ref 3.5–5.1)

## 2012-04-18 LAB — VANCOMYCIN, TROUGH: Vancomycin Tr: 8.8 ug/mL — ABNORMAL LOW (ref 10.0–20.0)

## 2012-04-18 LAB — GLUCOSE, CAPILLARY
Glucose-Capillary: 90 mg/dL (ref 70–99)
Glucose-Capillary: 75 mg/dL (ref 70–99)

## 2012-04-18 LAB — CULTURE, RESPIRATORY W GRAM STAIN: Culture: NORMAL

## 2012-04-18 MED ORDER — VANCOMYCIN HCL 1000 MG IV SOLR: 750.0000 mg | Freq: Two times a day (BID) | INTRAVENOUS | Status: DC

## 2012-04-18 MED ORDER — VANCOMYCIN HCL 1000 MG IV SOLR
750.0000 mg | Freq: Two times a day (BID) | INTRAVENOUS | Status: DC
  Filled 2012-04-18: qty 750

## 2012-04-18 NOTE — Progress Notes (Signed)
ANTIBIOTIC CONSULT NOTE - Follow up  Pharmacy Consult for vancomycin, zosyn Indication: rule out pneumonia  No Known Allergies  Patient Measurements: Height: 5\' 7"  (170.2 cm) Weight: 203 lb 14.8 oz (92.5 kg) (standing weight scale) IBW/kg (Calculated) : 66.1   Vital Signs: Temp: 98.1 F (36.7 C) (10/06 1356) Temp src: Oral (10/06 1356) BP: 112/65 mmHg (10/06 1356) Pulse Rate: 79  (10/06 1356) Intake/Output from previous day: 10/05 0701 - 10/06 0700 In: 1443.5 [P.O.:720; I.V.:386; IV Piggyback:337.5] Out: 2800 [Urine:2800] Intake/Output from this shift: Total I/O In: 569.2 [P.O.:490; I.V.:66.7; IV Piggyback:12.5] Out: 1000 [Urine:1000]  Labs:  Basename 04/18/12 0435 04/17/12 0445 04/16/12 1014  WBC -- -- --  HGB -- -- --  PLT -- -- --  LABCREA -- -- --  CREATININE 1.30 1.47* 2.03*   Estimated Creatinine Clearance: 61.5 ml/min (by C-G formula based on Cr of 1.3).  Basename 04/18/12 1403  VANCOTROUGH 8.8*  VANCOPEAK --  Drue Dun --  GENTTROUGH --  GENTPEAK --  GENTRANDOM --  TOBRATROUGH --  TOBRAPEAK --  TOBRARND --  AMIKACINPEAK --  AMIKACINTROU --  AMIKACIN --     Microbiology: Recent Results (from the past 720 hour(s))  MRSA PCR SCREENING     Status: Normal   Collection Time   04/13/12  5:39 AM      Component Value Range Status Comment   MRSA by PCR NEGATIVE  NEGATIVE Final   MRSA PCR SCREENING     Status: Normal   Collection Time   04/13/12  9:50 AM      Component Value Range Status Comment   MRSA by PCR NEGATIVE  NEGATIVE Final   CULTURE, BLOOD (ROUTINE X 2)     Status: Normal (Preliminary result)   Collection Time   04/15/12 12:15 PM      Component Value Range Status Comment   Specimen Description BLOOD RIGHT ARM   Final    Special Requests BOTTLES DRAWN AEROBIC ONLY 5.5CC   Final    Culture  Setup Time 04/15/2012 20:08   Final    Culture     Final    Value:        BLOOD CULTURE RECEIVED NO GROWTH TO DATE CULTURE WILL BE HELD FOR 5 DAYS  BEFORE ISSUING A FINAL NEGATIVE REPORT   Report Status PENDING   Incomplete   CULTURE, BLOOD (ROUTINE X 2)     Status: Normal (Preliminary result)   Collection Time   04/15/12 12:30 PM      Component Value Range Status Comment   Specimen Description BLOOD RIGHT HAND   Final    Special Requests BOTTLES DRAWN AEROBIC AND ANAEROBIC 10CC   Final    Culture  Setup Time 04/15/2012 20:08   Final    Culture     Final    Value:        BLOOD CULTURE RECEIVED NO GROWTH TO DATE CULTURE WILL BE HELD FOR 5 DAYS BEFORE ISSUING A FINAL NEGATIVE REPORT   Report Status PENDING   Incomplete   CULTURE, EXPECTORATED SPUTUM-ASSESSMENT     Status: Normal   Collection Time   04/16/12  8:00 AM      Component Value Range Status Comment   Specimen Description SPUTUM   Final    Special Requests NONE   Final    Sputum evaluation     Final    Value: THIS SPECIMEN IS ACCEPTABLE. RESPIRATORY CULTURE REPORT TO FOLLOW.   Report Status 04/16/2012 FINAL   Final  CULTURE, RESPIRATORY     Status: Normal   Collection Time   04/16/12  8:00 AM      Component Value Range Status Comment   Specimen Description SPUTUM   Final    Special Requests NONE   Final    Gram Stain     Final    Value: MODERATE WBC PRESENT,BOTH PMN AND MONONUCLEAR     RARE SQUAMOUS EPITHELIAL CELLS PRESENT     NO ORGANISMS SEEN   Culture NORMAL OROPHARYNGEAL FLORA   Final    Report Status 04/18/2012 FINAL   Final     Assessment: 66 yo man s/p fall with multiple rib fx transferred to ICU for respiratory distress. Started broad spectrum Abx r/o PNA.  No fevers noted, wbc 14. AKI improving, scr now 1.3 with est. Crcl~60. Vancomycin trough today resulted low, will adjust.  Goal of Therapy:  Vancomycin trough level 15-20 mcg/ml  Plan:  Zosyn 3.375 gm IV q8 hours. Increase vancomycin 750 mg IV q12 hours. F/u renal function, cultures, and clinical progress.  Severiano Gilbert 04/18/2012,4:15 PM

## 2012-04-18 NOTE — Progress Notes (Signed)
  Subjective: No complaints Off o2 Denies SOB  Objective: Vital signs in last 24 hours: Temp:  [97.7 F (36.5 C)-98.6 F (37 C)] 98.4 F (36.9 C) (10/06 0400) Pulse Rate:  [80-105] 92  (10/06 0700) Resp:  [14-23] 19  (10/06 0700) BP: (103-141)/(64-85) 136/85 mmHg (10/06 0700) SpO2:  [93 %-100 %] 95 % (10/06 0700) Weight:  [203 lb 14.8 oz (92.5 kg)] 203 lb 14.8 oz (92.5 kg) (10/06 0600) Last BM Date: 04/12/12  Intake/Output from previous day: 10/05 0701 - 10/06 0700 In: 1406 [P.O.:720; I.V.:386; IV Piggyback:300] Out: 2800 [Urine:2800] Intake/Output this shift:   Crackles bilateral bases, resp rate normal CV RRR Abdomen soft  Lab Results:   Basename 04/15/12 1254  WBC 14.7*  HGB 14.0  HCT 40.8  PLT 120*   BMET  Basename 04/18/12 0435 04/17/12 0445  NA 138 139  K 4.4 4.1  CL 102 104  CO2 26 26  GLUCOSE 89 124*  BUN 21 35*  CREATININE 1.30 1.47*  CALCIUM 9.2 8.3*   PT/INR No results found for this basename: LABPROT:2,INR:2 in the last 72 hours ABG No results found for this basename: PHART:2,PCO2:2,PO2:2,HCO3:2 in the last 72 hours  Studies/Results: No results found.  Anti-infectives: Anti-infectives     Start     Dose/Rate Route Frequency Ordered Stop   04/15/12 1430   vancomycin (VANCOCIN) 1,250 mg in sodium chloride 0.9 % 250 mL IVPB        1,250 mg 166.7 mL/hr over 90 Minutes Intravenous Every 24 hours 04/15/12 1357     04/15/12 1430   piperacillin-tazobactam (ZOSYN) IVPB 3.375 g        3.375 g 12.5 mL/hr over 240 Minutes Intravenous 3 times per day 04/15/12 1358            Assessment/Plan: s/p * No surgery found * Multiple rib fractures  Transfer to floor Advance po PT, pulmonary toilet  LOS: 6 days    Ory Elting A 04/18/2012

## 2012-04-18 NOTE — Progress Notes (Signed)
Patient transferred to [address]N32 in a wheelchair. Report given to [place], Charity fundraiser.  Pt transferred to chair, call bell left with patient.  RN aware of patient's arrival.

## 2012-04-18 NOTE — Progress Notes (Signed)
Hypoglycemic Event  CBG: 51  Treatment: 8oz orange juice  Symptoms: none   Follow-up CBG: Time: 0412 CBG Result: 90  Possible Reasons for Event:clear liquid diet, on lantus  Comments/MD notified:hyoglycemia protocol followed   Colton Carter  Remember to initiate Hypoglycemia Order Set & complete

## 2012-04-18 NOTE — Progress Notes (Signed)
Hypoglycemic Event  CBG: 38  Treatment: 25ml D50 IVP  Symptoms: lethargic  Follow-up CBG: Time:0054 CBG Result:78  Possible Reasons for Event: patient on clear liquid diet, overnight lantus  Comments/MD notified: Hypoglycemic protocol followed    Colton Carter  Remember to initiate Hypoglycemia Order Set & complete

## 2012-04-19 LAB — GLUCOSE, CAPILLARY
Glucose-Capillary: 131 mg/dL — ABNORMAL HIGH (ref 70–99)
Glucose-Capillary: 131 mg/dL — ABNORMAL HIGH (ref 70–99)

## 2012-04-19 LAB — CREATININE, SERUM
Creatinine, Ser: 1.21 mg/dL (ref 0.50–1.35)
GFR calc Af Amer: 71 mL/min — ABNORMAL LOW (ref >90)

## 2012-04-19 MED ORDER — LEVOFLOXACIN 500 MG PO TABS
500.0000 mg | ORAL_TABLET | ORAL | Status: DC
  Administered 2012-04-19 – 2012-04-20 (×2): 500 mg via ORAL
  Filled 2012-04-19 (×3): qty 1

## 2012-04-19 MED ORDER — BISACODYL 5 MG PO TBEC
10.0000 mg | DELAYED_RELEASE_TABLET | Freq: Every day | ORAL | Status: DC
  Filled 2012-04-19: qty 2

## 2012-04-19 MED ORDER — ALBUTEROL SULFATE (5 MG/ML) 0.5% IN NEBU: 2.5000 mg | INHALATION_SOLUTION | Freq: Four times a day (QID) | RESPIRATORY_TRACT | Status: DC | PRN

## 2012-04-19 MED ORDER — CYCLOBENZAPRINE HCL 10 MG PO TABS
10.0000 mg | ORAL_TABLET | Freq: Three times a day (TID) | ORAL | Status: DC
  Administered 2012-04-19 (×3): 10 mg via ORAL
  Filled 2012-04-19 (×4): qty 1

## 2012-04-19 MED ORDER — TRAMADOL HCL 50 MG PO TABS
100.0000 mg | ORAL_TABLET | Freq: Four times a day (QID) | ORAL | Status: DC
  Administered 2012-04-19 – 2012-04-20 (×4): 100 mg via ORAL
  Filled 2012-04-19 (×7): qty 2

## 2012-04-19 MED ORDER — ENOXAPARIN SODIUM 30 MG/0.3ML ~~LOC~~ SOLN
30.0000 mg | Freq: Two times a day (BID) | SUBCUTANEOUS | Status: DC
  Administered 2012-04-19 (×2): 30 mg via SUBCUTANEOUS
  Filled 2012-04-19 (×4): qty 0.3

## 2012-04-19 MED ORDER — IPRATROPIUM BROMIDE 0.02 % IN SOLN: 0.5000 mg | Freq: Four times a day (QID) | RESPIRATORY_TRACT | Status: DC | PRN

## 2012-04-19 MED ORDER — DOCUSATE SODIUM 100 MG PO CAPS
200.0000 mg | ORAL_CAPSULE | Freq: Two times a day (BID) | ORAL | Status: DC
  Filled 2012-04-19: qty 1

## 2012-04-19 MED ORDER — POLYETHYLENE GLYCOL 3350 17 G PO PACK
17.0000 g | PACK | Freq: Every day | ORAL | Status: DC
  Administered 2012-04-19: 17 g via ORAL
  Filled 2012-04-19 (×2): qty 1

## 2012-04-19 NOTE — Progress Notes (Signed)
Physical Therapy Treatment and Discharge.   Patient Details Name: Colton Carter MRN: [id number] DOB: 1945/10/27 Today's Date: 04/19/2012 Time: 1436-1500 PT Time Calculation (min): 24 min  PT Assessment / Plan / Recommendation Comments on Treatment Session  Pt demonstrating safety and independence with mobility and presents with no further acute PT needs.      Follow Up Recommendations  No PT follow up     Does the patient have the potential to tolerate intense rehabilitation     Barriers to Discharge        Equipment Recommendations  None recommended by PT    Recommendations for Other Services    Frequency Min 3X/week   Plan All goals met and education completed, patient dischaged from PT services    Precautions / Restrictions Precautions Precautions: Fall Restrictions Weight Bearing Restrictions: No   Pertinent Vitals/Pain Pt reporting pain in ribs 8/10.  RN notified.      Mobility  Bed Mobility Bed Mobility: Supine to Sit;Sitting - Scoot to Edge of Bed Supine to Sit: 6: Modified independent (Device/Increase time);HOB elevated Sitting - Scoot to Edge of Bed: 7: Independent Transfers Transfers: Sit to Stand;Stand to Sit Sit to Stand: 7: Independent;From bed;With upper extremity assist Stand to Sit: 7: Independent;To bed;With upper extremity assist Details for Transfer Assistance: Pt demonstrating safety and independence.  Ambulation/Gait Ambulation/Gait Assistance: 7: Independent Ambulation Distance (Feet): 200 Feet Assistive device: None Ambulation/Gait Assistance Details: Pt occasionally touching handrail in hallway.  Able to ambulate without handrail once instructed.   Gait Pattern: Within Functional Limits General Gait Details: Pt reports using straight cane prior to injury secondary to low back pain.   Stairs: No Wheelchair Mobility Wheelchair Mobility: No    Exercises     PT Diagnosis:    PT Problem List:   PT Treatment Interventions:     PT Goals Acute  Rehab PT Goals PT Goal Formulation: With patient Time For Goal Achievement: 04/23/12 Potential to Achieve Goals: Good Pt will go Supine/Side to Sit: with modified independence PT Goal: Supine/Side to Sit - Progress: Met Pt will go Sit to Supine/Side: with modified independence PT Goal: Sit to Supine/Side - Progress: Met Pt will go Sit to Stand: with modified independence PT Goal: Sit to Stand - Progress: Met Pt will go Stand to Sit: with modified independence PT Goal: Stand to Sit - Progress: Met Pt will Ambulate: >150 feet;with modified independence PT Goal: Ambulate - Progress: Met  Visit Information  Last PT Received On: 04/19/12 Assistance Needed: +1    Subjective Data  Subjective: I am going home tomorrow.  Patient Stated Goal: go home today   Cognition  Overall Cognitive Status: Appears within functional limits for tasks assessed/performed Arousal/Alertness: Awake/alert Orientation Level: Appears intact for tasks assessed Behavior During Session: [facility] for tasks performed    Balance  Balance Balance Assessed: Yes Static Standing Balance Static Standing - Balance Support: No upper extremity supported Static Standing - Level of Assistance: 7: Independent  End of Session PT - End of Session Activity Tolerance: Patient tolerated treatment well Patient left: in bed;with call bell/phone within reach Nurse Communication: Mobility status   GP     Shandon Matson 04/19/2012, 3:22 PM [place] L. Allex Lapoint DPT [phone]

## 2012-04-19 NOTE — Progress Notes (Signed)
Patient ID: Colton Carter, male   DOB: 05-23-1946, 66 y.o.   MRN: [id number]   LOS: 7 days   Subjective: No new c/o, still c/o severe right CW pain.  Objective: Vital signs in last 24 hours: Temp:  [98.1 F (36.7 C)-98.4 F (36.9 C)] 98.2 F (36.8 C) (10/07 0609) Pulse Rate:  [79-99] 94  (10/07 0609) Resp:  [13-23] 16  (10/07 0609) BP: (112-137)/(58-76) 137/75 mmHg (10/07 0609) SpO2:  [93 %-97 %] 94 % (10/07 0609) Last BM Date: 04/12/12   IS: (said he was yesterday)  Labs CBG (last 3)   Basename 04/18/12 2246 04/18/12 1702 04/18/12 1209  GLUCAP 155* 116* 105*     General appearance: alert and no distress Resp: clear to auscultation bilaterally Cardio: regular rate and rhythm GI: normal findings: bowel sounds normal and soft, non-tender   Assessment/Plan: Fall  R rib Fx 4-8 - Convert back to orals from PCA L knee abrasion  IDDM - Stable CRF -- Improved greatly over weekend. Appreciate renal consult and discharge recommendations.  FEN - PCA as above. Change abx to Levaquin for total 7d course. Increase bowel regimen. VTE - Will increase lovenox with real improvement, SCD's  Dispo -- Possibly home tomorrow if pain controlled and renal and respiratory status ok.    Freeman Caldron, PA-C Pager: [phone] General Trauma PA Pager: [phone]   04/19/2012

## 2012-04-19 NOTE — Progress Notes (Signed)
Changing meds to transition off PCA.  Pain control has been an issue.  Per patient doing better with pulmonary toilet and did ambulate. Patient examined and I agree with the assessment and plan  Violeta Gelinas, MD, MPH, FACS Pager: [phone]  04/19/2012 10:28 AM

## 2012-04-20 ENCOUNTER — Inpatient Hospital Stay (HOSPITAL_COMMUNITY): Payer: Medicare Other

## 2012-04-20 LAB — CBC
MCH: 31.4 pg (ref 26.0–34.0)
MCHC: 33.7 g/dL (ref 30.0–36.0)
MCV: 93.2 fL (ref 78.0–100.0)
Platelets: 160 10*3/uL (ref 150–400)
RDW: 14.4 % (ref 11.5–15.5)
WBC: 7.7 10*3/uL (ref 4.0–10.5)

## 2012-04-20 LAB — BASIC METABOLIC PANEL
Calcium: 9.7 mg/dL (ref 8.4–10.5)
Chloride: 105 mEq/L (ref 96–112)
Creatinine, Ser: 1.2 mg/dL (ref 0.50–1.35)
GFR calc Af Amer: 72 mL/min — ABNORMAL LOW (ref >90)
GFR calc non Af Amer: 62 mL/min — ABNORMAL LOW (ref >90)

## 2012-04-20 MED ORDER — TRAMADOL HCL 50 MG PO TABS: 100.0000 mg | ORAL_TABLET | Freq: Four times a day (QID) | ORAL | Status: DC

## 2012-04-20 MED ORDER — DSS 100 MG PO CAPS: 200.0000 mg | ORAL_CAPSULE | Freq: Two times a day (BID) | ORAL | Status: DC

## 2012-04-20 MED ORDER — OXYCODONE-ACETAMINOPHEN 10-325 MG PO TABS: 1.0000 | ORAL_TABLET | ORAL | Status: AC | PRN

## 2012-04-20 MED ORDER — CYCLOBENZAPRINE HCL 10 MG PO TABS: 10.0000 mg | ORAL_TABLET | Freq: Three times a day (TID) | ORAL | Status: DC

## 2012-04-20 MED ORDER — POLYETHYLENE GLYCOL 3350 17 G PO PACK: 17.0000 g | PACK | Freq: Every day | ORAL | Status: DC

## 2012-04-20 NOTE — Discharge Summary (Signed)
Omnia Dollinger, MD, MPH, FACS Pager: [phone]  

## 2012-04-20 NOTE — Progress Notes (Signed)
Patient discharged to home in care of spouse. Medications and instructions reviewed with patient and spouse with all questions answered. IV d/c'd with cath intact. Assessment unchanged from this am. Patient is to follow up with PCP and kidney doctor in 2 weeks and with Trauma Clinic as needed.

## 2012-04-20 NOTE — Progress Notes (Signed)
Plan to D/C this AM, doing much better Patient examined and I agree with the assessment and plan  Violeta Gelinas, MD, MPH, FACS Pager: [phone]  04/20/2012 9:43 AM

## 2012-04-20 NOTE — Discharge Summary (Signed)
Physician Discharge Summary  Patient ID: Colton Carter MRN: [id number] DOB/AGE: Jul 09, 1946 66 y.o.  Admit date: 04/12/2012 Discharge date: 04/20/2012  Discharge Diagnoses Patient Active Problem List   Diagnosis Date Noted  . Low testosterone 04/15/2012  . Rheumatoid arthritis 04/15/2012  . GERD (gastroesophageal reflux disease) 04/15/2012  . COPD (chronic obstructive pulmonary disease) 04/15/2012  . Chronic renal failure 04/14/2012  . Multiple fractures of ribs of right side 04/13/2012  . Fall from ladder 04/13/2012  . Abrasion of left knee 04/13/2012  . DM (diabetes mellitus) 04/13/2012  . Acute blood loss anemia 04/13/2012  . High cholesterol   . Chronic neck pain     Consultants Dr. Zetta Bills for nephrology   Procedures None   HPI: Colton Carter was fixing a pool shed and fell off ladder onto the edge of the pool and then into the empty pool. He struck his RUE and right lateral chest. He did not strike his head or pass out. He states that he "lost his balance," but he denies passing out. His workup, which included CT scans of the cervical spine, chest, abdomen, and pelvis as well as x-rays of the left knee, demonstrated the above-mentioned injuries. He was admitted by the trauma service for pain control and pulmonary toilet.   Hospital Course: Initially Trevor did fairly well in the hospital. His pain was able to be controlled, albeit marginally, on oral medications. He was able to mobilize on his own despite the pain. His creatinine steadily rose over his first 3 days in the hospital so nephrology was consulted. They made some medication adjustments which seemed to help. The morning of hospital day #4 the patient's right chest pain dramatically worsened. This quickly escalated and was accompanied by tachycardia and severe anxiety along with some confusion. Although previously afebrile he soon developed a temperature of 102.4 and an elevation in his white blood cell count to 14.7. He was  transferred to the ICU for further care. Empiric antibiotics were started and he was pan-cultured. A chest x-ray did not demonstrate any infiltrates and cultures were eventually negative. He was transitioned from IV to an oral antibiotic and completed a six-day course. A pulmonary embolus was considered but angiography was contraindicated given his renal function. Lower extremity dopplers were negative. He made a relatively quick improvement and was able to be transferred back to the floor. Pain control was again established with oral medications. Physical and occupational therapies worked with him but he did not have any post-hospital needs. At the time of discharge he was off supplemental oxygen, his pain was controlled, and his renal function had improved with a creatinine of 1.20. He was discharged home in improved condition.      Medication List     As of 04/20/2012 10:13 AM    STOP taking these medications         furosemide 40 MG tablet   Commonly known as: LASIX      HYDROcodone-acetaminophen 10-500 MG per tablet   Commonly known as: LORTAB      meloxicam 15 MG tablet   Commonly known as: MOBIC      TAKE these medications         ALPRAZolam 0.25 MG tablet   Commonly known as: XANAX   Take 0.25-0.5 mg by mouth 3 (three) times daily. Takes 2 tabs in am, 1 at lunch and 1 at bedtime      atenolol 25 MG tablet   Commonly known as: TENORMIN   Take  25 mg by mouth daily.      cyclobenzaprine 10 MG tablet   Commonly known as: FLEXERIL   Take 1 tablet (10 mg total) by mouth 3 (three) times daily.      DSS 100 MG Caps   Take 200 mg by mouth 2 (two) times daily.      febuxostat 40 MG tablet   Commonly known as: ULORIC   Take 40-80 mg by mouth See admin instructions. Take 2 tablets on one day then takes 1 tablet      Fluticasone-Salmeterol 250-50 MCG/DOSE Aepb   Commonly known as: ADVAIR   Inhale 1 puff into the lungs every 12 (twelve) hours.      gabapentin 600 MG tablet    Commonly known as: NEURONTIN   Take 600 mg by mouth 3 (three) times daily.      insulin aspart 100 UNIT/ML injection   Commonly known as: novoLOG   Inject 5-15 Units into the skin 3 (three) times daily before meals. SS  100-150 = 5 units  151-200 = 7 units  201-250 = 9 units  251-300 =11 units  301-350 = 13 units  351-400 = 15 units      insulin glargine 100 UNIT/ML injection   Commonly known as: LANTUS   Inject 75 Units into the skin 2 (two) times daily.      lisinopril-hydrochlorothiazide 20-25 MG per tablet   Commonly known as: PRINZIDE,ZESTORETIC   Take 1 tablet by mouth daily.      methotrexate 1 G injection   Inject into the vein every 7 (seven) days. 25mg /ml. For arthritis      montelukast 10 MG tablet   Commonly known as: SINGULAIR   Take 10 mg by mouth daily.      omeprazole 20 MG capsule   Commonly known as: PRILOSEC   Take 20 mg by mouth daily.      oxyCODONE-acetaminophen 10-325 MG per tablet   Commonly known as: PERCOCET   Take 1-2 tablets by mouth every 4 (four) hours as needed for pain.      polyethylene glycol packet   Commonly known as: MIRALAX / GLYCOLAX   Take 17 g by mouth daily.      simvastatin 40 MG tablet   Commonly known as: ZOCOR   Take 40 mg by mouth daily.      testosterone cypionate 200 MG/ML injection   Commonly known as: DEPOTESTOTERONE CYPIONATE   Inject 200 mg into the muscle every 14 (fourteen) days.      tiotropium 18 MCG inhalation capsule   Commonly known as: SPIRIVA   Place 18 mcg into inhaler and inhale daily.      traMADol 50 MG tablet   Commonly known as: ULTRAM   Take 2 tablets (100 mg total) by mouth every 6 (six) hours.              Follow-up Information    Follow up with Mosetta Pigeon, MD. Schedule an appointment as soon as possible for a visit in 2 weeks.   Contact information:   [facility] MEDICINE [facility] Matt Holmes [address] Raeanne Gathers [place] [address] [phone]        Follow up with [facility], MD. Schedule an appointment as soon as possible for a visit in 2 weeks.   Contact information:   [address]   [place] [address] [phone]       Call [facility]. (As needed)    Contact information:  [address] [place] [address] [phone]          Discharge planning took >30 minutes.   Signed: Freeman Caldron, PA-C Pager: [phone] General Trauma PA Pager: [phone]  04/20/2012, 9:43 AM

## 2012-04-20 NOTE — Progress Notes (Signed)
Patient ID: Colton Carter, male   DOB: 10-05-1945, 66 y.o.   MRN: [id number]   LOS: 8 days   Subjective: Doing better.  Objective: Vital signs in last 24 hours: Temp:  [97.4 F (36.3 C)-98.8 F (37.1 C)] 98.2 F (36.8 C) (10/08 0540) Pulse Rate:  [80-94] 88  (10/08 0540) Resp:  [18-21] 18  (10/08 0540) BP: (119-151)/(57-81) 151/81 mmHg (10/08 0540) SpO2:  [94 %-96 %] 95 % (10/08 0540) Last BM Date: 04/19/12   IS:   Lab Results:  CBC Pending  BMET Pending  CBG (last 3)   Basename 04/19/12 2157 04/19/12 1707 04/19/12 1203  GLUCAP 158* 131* 173*      Radiology CXR: Pending   General appearance: alert and no distress Resp: clear to auscultation bilaterally Cardio: regular rate and rhythm GI: normal findings: bowel sounds normal and soft, non-tender   Assessment/Plan: Fall  R rib Fx 4-8 - Pain control good. CXR pending. L knee abrasion  IDDM - Stable  CRF -- BMET pending. Appreciate renal consult and discharge recommendations.  FEN - No issues  VTE - Lovenox, SCD's  Dispo -- Home today if CXR, labs ok.     Freeman Caldron, PA-C Pager: [phone] General Trauma PA Pager: [phone]   04/20/2012

## 2012-04-21 LAB — CULTURE, BLOOD (ROUTINE X 2): Culture: NO GROWTH

## 2012-07-14 DIAGNOSIS — C4490 Unspecified malignant neoplasm of skin, unspecified: Secondary | ICD-10-CM

## 2012-07-14 HISTORY — PX: SKIN CANCER EXCISION: SHX779

## 2012-07-14 HISTORY — DX: Unspecified malignant neoplasm of skin, unspecified: C44.90

## 2012-09-22 ENCOUNTER — Other Ambulatory Visit: Payer: Self-pay | Admitting: Neurosurgery

## 2012-09-22 DIAGNOSIS — M549 Dorsalgia, unspecified: Secondary | ICD-10-CM

## 2012-09-23 ENCOUNTER — Ambulatory Visit
Admission: RE | Admit: 2012-09-23 | Discharge: 2012-09-23 | Disposition: A | Discharge status: Home / Self Care | Payer: Medicare Other | Source: Ambulatory Visit | Attending: Neurosurgery | Admitting: Neurosurgery

## 2012-09-23 DIAGNOSIS — M549 Dorsalgia, unspecified: Secondary | ICD-10-CM

## 2013-01-12 LAB — BASIC METABOLIC PANEL
Anion Gap: 9 (ref 7–16)
Calcium, Total: 7.7 mg/dL — ABNORMAL LOW (ref 8.5–10.1)
EGFR (African American): 9 — ABNORMAL LOW
Glucose: 120 mg/dL — ABNORMAL HIGH (ref 65–99)
Potassium: 5.6 mmol/L — ABNORMAL HIGH (ref 3.5–5.1)

## 2013-01-12 LAB — CBC
HGB: 15.2 g/dL (ref 13.0–18.0)
MCHC: 33.5 g/dL (ref 32.0–36.0)
Platelet: 126 10*3/uL — ABNORMAL LOW (ref 150–440)
WBC: 11 10*3/uL — ABNORMAL HIGH (ref 3.8–10.6)

## 2013-01-12 LAB — TROPONIN I: Troponin-I: <0.02 ng/mL

## 2013-01-13 ENCOUNTER — Inpatient Hospital Stay: Payer: Self-pay | Admitting: Specialist

## 2013-01-13 LAB — HEMOGLOBIN: HGB: 13.9 g/dL (ref 13.0–18.0)

## 2013-01-13 LAB — CK TOTAL AND CKMB (NOT AT ARMC)
CK, Total: 273 U/L — ABNORMAL HIGH (ref 35–232)
CK, Total: 143 U/L (ref 35–232)
CK-MB: 2.1 ng/mL (ref 0.5–3.6)

## 2013-01-13 LAB — POTASSIUM: Potassium: 6.9 mmol/L (ref 3.5–5.1)

## 2013-01-13 LAB — BASIC METABOLIC PANEL
BUN: 80 mg/dL — ABNORMAL HIGH (ref 7–18)
Chloride: 106 mmol/L (ref 98–107)
Co2: 24 mmol/L (ref 21–32)
EGFR (Non-African Amer.): 9 — ABNORMAL LOW
Osmolality: 307 (ref 275–301)
Sodium: 138 mmol/L (ref 136–145)

## 2013-01-13 LAB — COMPREHENSIVE METABOLIC PANEL
Albumin: 2.9 g/dL — ABNORMAL LOW (ref 3.4–5.0)
Anion Gap: 6 — ABNORMAL LOW (ref 7–16)
Bilirubin,Total: 0.5 mg/dL (ref 0.2–1.0)
Calcium, Total: 7.2 mg/dL — ABNORMAL LOW (ref 8.5–10.1)
Chloride: 107 mmol/L (ref 98–107)
Creatinine: 4.85 mg/dL — ABNORMAL HIGH (ref 0.60–1.30)
EGFR (Non-African Amer.): 12 — ABNORMAL LOW
Glucose: 268 mg/dL — ABNORMAL HIGH (ref 65–99)
Potassium: 5.4 mmol/L — ABNORMAL HIGH (ref 3.5–5.1)
SGPT (ALT): 26 U/L (ref 12–78)
Sodium: 139 mmol/L (ref 136–145)

## 2013-01-13 LAB — URINALYSIS, COMPLETE
Bilirubin,UR: NEGATIVE
Blood: NEGATIVE
Glucose,UR: 150 mg/dL (ref 0–75)
Ketone: NEGATIVE
Specific Gravity: 1.015 (ref 1.003–1.030)
Squamous Epithelial: NONE SEEN
WBC UR: 1 /HPF (ref 0–5)

## 2013-01-13 LAB — CREATININE, URINE, RANDOM: Creatinine, Urine Random: 110.1 mg/dL (ref 30.0–125.0)

## 2013-01-13 LAB — CK: CK, Total: 324 U/L — ABNORMAL HIGH (ref 35–232)

## 2013-01-13 LAB — TROPONIN I: Troponin-I: <0.02 ng/mL

## 2013-01-14 LAB — CBC WITH DIFFERENTIAL/PLATELET
Basophil #: 0.1 10*3/uL (ref 0.0–0.1)
Eosinophil #: 0 10*3/uL (ref 0.0–0.7)
HCT: 39.7 % — ABNORMAL LOW (ref 40.0–52.0)
HGB: 13.3 g/dL (ref 13.0–18.0)
Lymphocyte %: 3.8 %
MCH: 32.8 pg (ref 26.0–34.0)
MCHC: 33.5 g/dL (ref 32.0–36.0)
MCV: 98 fL (ref 80–100)
Monocyte %: 1.5 %
Neutrophil #: 14 10*3/uL — ABNORMAL HIGH (ref 1.4–6.5)
Neutrophil %: 93.9 %
Platelet: 128 10*3/uL — ABNORMAL LOW (ref 150–440)
WBC: 14.9 10*3/uL — ABNORMAL HIGH (ref 3.8–10.6)

## 2013-01-14 LAB — BASIC METABOLIC PANEL
BUN: 66 mg/dL — ABNORMAL HIGH (ref 7–18)
Calcium, Total: 7.5 mg/dL — ABNORMAL LOW (ref 8.5–10.1)
Chloride: 107 mmol/L (ref 98–107)
Co2: 29 mmol/L (ref 21–32)
Creatinine: 3.66 mg/dL — ABNORMAL HIGH (ref 0.60–1.30)
EGFR (African American): 19 — ABNORMAL LOW
EGFR (Non-African Amer.): 16 — ABNORMAL LOW
Glucose: 160 mg/dL — ABNORMAL HIGH (ref 65–99)
Osmolality: 309 (ref 275–301)
Sodium: 144 mmol/L (ref 136–145)

## 2013-01-14 LAB — HEMOGLOBIN A1C: Hemoglobin A1C: 8.4 % — ABNORMAL HIGH (ref 4.2–6.3)

## 2013-01-15 LAB — CBC WITH DIFFERENTIAL/PLATELET
Basophil %: 1 %
Eosinophil #: 0 10*3/uL (ref 0.0–0.7)
Eosinophil %: 0 %
HGB: 13 g/dL (ref 13.0–18.0)
Lymphocyte #: 0.4 10*3/uL — ABNORMAL LOW (ref 1.0–3.6)
MCH: 32.8 pg (ref 26.0–34.0)
MCV: 98 fL (ref 80–100)
Monocyte #: 0.1 x10 3/mm — ABNORMAL LOW (ref 0.2–1.0)
Monocyte %: 1.3 %
Neutrophil #: 10.1 10*3/uL — ABNORMAL HIGH (ref 1.4–6.5)
Neutrophil %: 93.5 %
RBC: 3.97 10*6/uL — ABNORMAL LOW (ref 4.40–5.90)
RDW: 16.8 % — ABNORMAL HIGH (ref 11.5–14.5)

## 2013-01-15 LAB — BASIC METABOLIC PANEL
Anion Gap: 7 (ref 7–16)
Calcium, Total: 7.8 mg/dL — ABNORMAL LOW (ref 8.5–10.1)
Chloride: 105 mmol/L (ref 98–107)
Co2: 29 mmol/L (ref 21–32)
Creatinine: 2.18 mg/dL — ABNORMAL HIGH (ref 0.60–1.30)
EGFR (African American): 35 — ABNORMAL LOW
EGFR (Non-African Amer.): 30 — ABNORMAL LOW
Osmolality: 299 (ref 275–301)
Potassium: 3.9 mmol/L (ref 3.5–5.1)
Sodium: 141 mmol/L (ref 136–145)

## 2013-01-15 LAB — MAGNESIUM: Magnesium: 2.1 mg/dL

## 2013-01-16 LAB — BASIC METABOLIC PANEL
Anion Gap: 6 — ABNORMAL LOW (ref 7–16)
BUN: 54 mg/dL — ABNORMAL HIGH (ref 7–18)
Chloride: 110 mmol/L — ABNORMAL HIGH (ref 98–107)
Co2: 27 mmol/L (ref 21–32)
EGFR (African American): 46 — ABNORMAL LOW

## 2013-01-16 LAB — CBC WITH DIFFERENTIAL/PLATELET
Basophil %: 0.7 %
Eosinophil #: 0 10*3/uL (ref 0.0–0.7)
HGB: 12.4 g/dL — ABNORMAL LOW (ref 13.0–18.0)
Lymphocyte #: 0.6 10*3/uL — ABNORMAL LOW (ref 1.0–3.6)
MCH: 33.4 pg (ref 26.0–34.0)
MCHC: 34.3 g/dL (ref 32.0–36.0)
MCV: 97 fL (ref 80–100)
Monocyte #: 0.1 x10 3/mm — ABNORMAL LOW (ref 0.2–1.0)
Monocyte %: 1.2 %
Neutrophil %: 87.7 %
RBC: 3.71 10*6/uL — ABNORMAL LOW (ref 4.40–5.90)
RDW: 16.5 % — ABNORMAL HIGH (ref 11.5–14.5)

## 2013-01-17 LAB — BASIC METABOLIC PANEL
Anion Gap: 6 — ABNORMAL LOW (ref 7–16)
BUN: 57 mg/dL — ABNORMAL HIGH (ref 7–18)
Calcium, Total: 8.2 mg/dL — ABNORMAL LOW (ref 8.5–10.1)
Co2: 26 mmol/L (ref 21–32)
Creatinine: 1.48 mg/dL — ABNORMAL HIGH (ref 0.60–1.30)
EGFR (African American): 56 — ABNORMAL LOW
EGFR (Non-African Amer.): 49 — ABNORMAL LOW
Glucose: 216 mg/dL — ABNORMAL HIGH (ref 65–99)
Osmolality: 309 (ref 275–301)
Potassium: 3.8 mmol/L (ref 3.5–5.1)
Sodium: 144 mmol/L (ref 136–145)

## 2013-01-17 LAB — CBC WITH DIFFERENTIAL/PLATELET
Basophil #: 0 10*3/uL (ref 0.0–0.1)
Basophil %: 0.7 %
Eosinophil #: 0 10*3/uL (ref 0.0–0.7)
Eosinophil %: 0.6 %
HCT: 37.6 % — ABNORMAL LOW (ref 40.0–52.0)
MCH: 33.1 pg (ref 26.0–34.0)
MCV: 97 fL (ref 80–100)
Neutrophil #: 2.7 10*3/uL (ref 1.4–6.5)
Neutrophil %: 74.6 %
RBC: 3.89 10*6/uL — ABNORMAL LOW (ref 4.40–5.90)

## 2013-01-18 LAB — CBC WITH DIFFERENTIAL/PLATELET
Basophil #: 0 10*3/uL (ref 0.0–0.1)
Basophil %: 0.3 %
Eosinophil #: 0 10*3/uL (ref 0.0–0.7)
Eosinophil %: 0.3 %
HCT: 39.6 % — ABNORMAL LOW (ref 40.0–52.0)
Lymphocyte #: 0.7 10*3/uL — ABNORMAL LOW (ref 1.0–3.6)
Monocyte #: 0.2 x10 3/mm (ref 0.2–1.0)
Monocyte %: 2.7 %
Neutrophil %: 86.3 %

## 2013-01-18 LAB — BASIC METABOLIC PANEL
Anion Gap: 7 (ref 7–16)
BUN: 48 mg/dL — ABNORMAL HIGH (ref 7–18)
Co2: 31 mmol/L (ref 21–32)
EGFR (African American): >60
EGFR (Non-African Amer.): 59 — ABNORMAL LOW

## 2013-01-20 LAB — CBC WITH DIFFERENTIAL/PLATELET
Basophil %: 0.3 %
Eosinophil %: 1.6 %
HGB: 12.8 g/dL — ABNORMAL LOW (ref 13.0–18.0)
Lymphocyte #: 0.6 10*3/uL — ABNORMAL LOW (ref 1.0–3.6)
Lymphocyte %: 10.1 %
Monocyte #: 0.4 x10 3/mm (ref 0.2–1.0)
Monocyte %: 6.5 %
Neutrophil #: 5.1 10*3/uL (ref 1.4–6.5)
Neutrophil %: 81.5 %
Platelet: 66 10*3/uL — ABNORMAL LOW (ref 150–440)
RBC: 3.92 10*6/uL — ABNORMAL LOW (ref 4.40–5.90)
RDW: 16.3 % — ABNORMAL HIGH (ref 11.5–14.5)

## 2013-01-20 LAB — BASIC METABOLIC PANEL
BUN: 36 mg/dL — ABNORMAL HIGH (ref 7–18)
Calcium, Total: 8.3 mg/dL — ABNORMAL LOW (ref 8.5–10.1)
Chloride: 107 mmol/L (ref 98–107)
EGFR (African American): >60
Osmolality: 299 (ref 275–301)
Potassium: 3.5 mmol/L (ref 3.5–5.1)

## 2013-01-21 LAB — CBC WITH DIFFERENTIAL/PLATELET
Basophil #: 0 10*3/uL (ref 0.0–0.1)
Basophil %: 0.4 %
Eosinophil %: 1.6 %
HGB: 14 g/dL (ref 13.0–18.0)
Lymphocyte #: 0.8 10*3/uL — ABNORMAL LOW (ref 1.0–3.6)
Lymphocyte %: 12 %
MCH: 32.2 pg (ref 26.0–34.0)
MCHC: 33.7 g/dL (ref 32.0–36.0)
Neutrophil #: 4.9 10*3/uL (ref 1.4–6.5)
Platelet: 86 10*3/uL — ABNORMAL LOW (ref 150–440)
RBC: 4.34 10*6/uL — ABNORMAL LOW (ref 4.40–5.90)

## 2013-01-21 LAB — CLOSTRIDIUM DIFFICILE BY PCR

## 2013-01-22 LAB — CBC WITH DIFFERENTIAL/PLATELET
Basophil #: 0.1 10*3/uL (ref 0.0–0.1)
Basophil %: 0.9 %
Eosinophil #: 0.2 10*3/uL (ref 0.0–0.7)
Eosinophil %: 3.8 %
HGB: 12.9 g/dL — ABNORMAL LOW (ref 13.0–18.0)
Lymphocyte #: 0.9 10*3/uL — ABNORMAL LOW (ref 1.0–3.6)
Lymphocyte %: 13.7 %
MCH: 32.2 pg (ref 26.0–34.0)
Monocyte #: 0.8 x10 3/mm (ref 0.2–1.0)
Monocyte %: 11.5 %
Neutrophil %: 70.1 %
Platelet: 91 10*3/uL — ABNORMAL LOW (ref 150–440)
RBC: 4 10*6/uL — ABNORMAL LOW (ref 4.40–5.90)
WBC: 6.6 10*3/uL (ref 3.8–10.6)

## 2013-01-23 LAB — STOOL CULTURE

## 2013-01-30 ENCOUNTER — Ambulatory Visit: Payer: Self-pay | Admitting: Urology

## 2013-01-30 ENCOUNTER — Inpatient Hospital Stay: Payer: Self-pay | Admitting: Internal Medicine

## 2013-01-30 LAB — CBC
HCT: 37.1 % — ABNORMAL LOW
HGB: 12.6 g/dL — ABNORMAL LOW
MCH: 32.2 pg
MCHC: 34 g/dL
MCV: 95 fL
Platelet: 200 x10 3/mm 3
RBC: 3.92 x10 6/mm 3 — ABNORMAL LOW
RDW: 15.5 % — ABNORMAL HIGH
WBC: 5.5 x10 3/mm 3

## 2013-01-30 LAB — COMPREHENSIVE METABOLIC PANEL WITH GFR
Albumin: 3.1 g/dL — ABNORMAL LOW
Alkaline Phosphatase: 86 U/L
Anion Gap: 4 — ABNORMAL LOW
BUN: 54 mg/dL — ABNORMAL HIGH
Bilirubin,Total: 0.4 mg/dL
Calcium, Total: 8.1 mg/dL — ABNORMAL LOW
Chloride: 104 mmol/L
Co2: 29 mmol/L
Creatinine: 2.39 mg/dL — ABNORMAL HIGH
EGFR (African American): 32 — ABNORMAL LOW
EGFR (Non-African Amer.): 27 — ABNORMAL LOW
Glucose: 140 mg/dL — ABNORMAL HIGH
Osmolality: 291
Potassium: 4.5 mmol/L
SGOT(AST): 20 U/L
SGPT (ALT): 32 U/L
Sodium: 137 mmol/L
Total Protein: 6 g/dL — ABNORMAL LOW

## 2013-01-30 LAB — URINALYSIS, COMPLETE
Bilirubin,UR: NEGATIVE
Blood: NEGATIVE
Glucose,UR: >=500 mg/dL (ref 0–75)
Hyaline Cast: 9
Ketone: NEGATIVE
Nitrite: NEGATIVE
Protein: NEGATIVE
RBC,UR: 1 /HPF (ref 0–5)
Squamous Epithelial: <1
WBC UR: 3 /HPF (ref 0–5)

## 2013-01-30 LAB — TROPONIN I: Troponin-I: <0.02 ng/mL

## 2013-01-31 LAB — BASIC METABOLIC PANEL
Anion Gap: 5 — ABNORMAL LOW (ref 7–16)
Chloride: 111 mmol/L — ABNORMAL HIGH (ref 98–107)
Creatinine: 1.53 mg/dL — ABNORMAL HIGH (ref 0.60–1.30)
EGFR (African American): 54 — ABNORMAL LOW
EGFR (Non-African Amer.): 47 — ABNORMAL LOW
Osmolality: 297 (ref 275–301)
Sodium: 144 mmol/L (ref 136–145)

## 2013-01-31 LAB — CBC WITH DIFFERENTIAL/PLATELET
Basophil #: 0 10*3/uL (ref 0.0–0.1)
Eosinophil #: 0.2 10*3/uL (ref 0.0–0.7)
HCT: 36.1 % — ABNORMAL LOW (ref 40.0–52.0)
HGB: 12.6 g/dL — ABNORMAL LOW (ref 13.0–18.0)
Lymphocyte #: 1.4 10*3/uL (ref 1.0–3.6)
Monocyte #: 0.6 x10 3/mm (ref 0.2–1.0)
Monocyte %: 14.2 %
Platelet: 170 10*3/uL (ref 150–440)
RBC: 3.84 10*6/uL — ABNORMAL LOW (ref 4.40–5.90)

## 2013-02-01 LAB — URINE CULTURE

## 2013-02-04 LAB — CULTURE, BLOOD (SINGLE)

## 2013-12-23 ENCOUNTER — Emergency Department: Payer: Self-pay | Admitting: Emergency Medicine

## 2013-12-29 ENCOUNTER — Ambulatory Visit (INDEPENDENT_AMBULATORY_CARE_PROVIDER_SITE_OTHER): Payer: Medicare Other

## 2013-12-29 ENCOUNTER — Ambulatory Visit (INDEPENDENT_AMBULATORY_CARE_PROVIDER_SITE_OTHER): Payer: Medicare Other | Admitting: Podiatrist

## 2013-12-29 ENCOUNTER — Encounter: Payer: Self-pay | Admitting: Podiatrist

## 2013-12-29 VITALS — BP 150/82 | HR 86 | Resp 16 | Ht 67.0 in | Wt 204.0 lb

## 2013-12-29 DIAGNOSIS — IMO0002 Reserved for concepts with insufficient information to code with codable children: Secondary | ICD-10-CM

## 2013-12-29 DIAGNOSIS — B351 Tinea unguium: Secondary | ICD-10-CM

## 2013-12-29 DIAGNOSIS — S9030XA Contusion of unspecified foot, initial encounter: Secondary | ICD-10-CM

## 2013-12-29 DIAGNOSIS — M79609 Pain in unspecified limb: Secondary | ICD-10-CM

## 2013-12-29 DIAGNOSIS — M722 Plantar fascial fibromatosis: Secondary | ICD-10-CM

## 2013-12-29 DIAGNOSIS — S90819A Abrasion, unspecified foot, initial encounter: Secondary | ICD-10-CM

## 2013-12-29 DIAGNOSIS — M79606 Pain in leg, unspecified: Secondary | ICD-10-CM

## 2013-12-29 MED ORDER — SILVER SULFADIAZINE 1 % EX CREA
1.0000 | TOPICAL_CREAM | Freq: Every day | CUTANEOUS | Status: DC
Start: 2013-12-29 — End: 2015-06-14
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
## 2013-12-29 NOTE — Patient Instructions (Signed)

## 2014-02-01 ENCOUNTER — Ambulatory Visit
Admission: RE | Admit: 2014-02-01 | Discharge: 2014-02-01 | Disposition: A | Discharge status: Home / Self Care | Payer: Medicare Other | Source: Ambulatory Visit | Attending: Rheumatology | Admitting: Rheumatology

## 2014-02-01 ENCOUNTER — Other Ambulatory Visit: Payer: Self-pay | Admitting: Rheumatology

## 2014-02-01 DIAGNOSIS — R609 Edema, unspecified: Secondary | ICD-10-CM

## 2014-03-22 ENCOUNTER — Emergency Department: Payer: Self-pay | Admitting: Emergency Medicine

## 2014-03-22 LAB — COMPREHENSIVE METABOLIC PANEL
ALK PHOS: 125 U/L — AB
ALT: 38 U/L
AST: 28 U/L (ref 15–37)
Albumin: 3.6 g/dL (ref 3.4–5.0)
Anion Gap: 5 — ABNORMAL LOW (ref 7–16)
BILIRUBIN TOTAL: 0.7 mg/dL (ref 0.2–1.0)
BUN: 20 mg/dL — ABNORMAL HIGH (ref 7–18)
CO2: 29 mmol/L (ref 21–32)
Calcium, Total: 8.8 mg/dL (ref 8.5–10.1)
Chloride: 105 mmol/L (ref 98–107)
Creatinine: 1.3 mg/dL (ref 0.60–1.30)
GFR CALC NON AF AMER: 56 — AB
Glucose: 161 mg/dL — ABNORMAL HIGH (ref 65–99)
OSMOLALITY: 284 (ref 275–301)
Potassium: 3.6 mmol/L (ref 3.5–5.1)
Sodium: 139 mmol/L (ref 136–145)
Total Protein: 7.3 g/dL (ref 6.4–8.2)

## 2014-03-22 LAB — TROPONIN I

## 2014-03-22 LAB — CBC
HCT: 54 % — ABNORMAL HIGH (ref 40.0–52.0)
HGB: 17.8 g/dL (ref 13.0–18.0)
MCH: 30.6 pg (ref 26.0–34.0)
MCHC: 32.9 g/dL (ref 32.0–36.0)
MCV: 93 fL (ref 80–100)
Platelet: 144 10*3/uL — ABNORMAL LOW (ref 150–440)
RBC: 5.81 10*6/uL (ref 4.40–5.90)
RDW: 14.4 % (ref 11.5–14.5)
WBC: 8.7 10*3/uL (ref 3.8–10.6)

## 2014-03-22 LAB — LIPASE, BLOOD: Lipase: 315 U/L (ref 73–393)

## 2014-03-24 ENCOUNTER — Emergency Department: Payer: Self-pay | Admitting: Internal Medicine

## 2014-03-24 LAB — COMPREHENSIVE METABOLIC PANEL
ALK PHOS: 104 U/L
Albumin: 3 g/dL — ABNORMAL LOW (ref 3.4–5.0)
Anion Gap: 7 (ref 7–16)
BUN: 17 mg/dL (ref 7–18)
Bilirubin,Total: 0.4 mg/dL (ref 0.2–1.0)
CALCIUM: 8.1 mg/dL — AB (ref 8.5–10.1)
CHLORIDE: 105 mmol/L (ref 98–107)
CO2: 28 mmol/L (ref 21–32)
Creatinine: 1.33 mg/dL — ABNORMAL HIGH (ref 0.60–1.30)
EGFR (African American): >60
EGFR (Non-African Amer.): 55 — ABNORMAL LOW
GLUCOSE: 155 mg/dL — AB (ref 65–99)
Osmolality: 284 (ref 275–301)
Potassium: 3.6 mmol/L (ref 3.5–5.1)
SGOT(AST): 34 U/L (ref 15–37)
SGPT (ALT): 34 U/L
SODIUM: 140 mmol/L (ref 136–145)
Total Protein: 6.4 g/dL (ref 6.4–8.2)

## 2014-03-24 LAB — URINALYSIS, COMPLETE
BLOOD: NEGATIVE
Bacteria: NONE SEEN
Bilirubin,UR: NEGATIVE
Ketone: NEGATIVE
Leukocyte Esterase: NEGATIVE
Nitrite: NEGATIVE
Ph: 5 (ref 4.5–8.0)
Protein: 100
RBC,UR: NONE SEEN /HPF (ref 0–5)
SPECIFIC GRAVITY: 1.021 (ref 1.003–1.030)
SQUAMOUS EPITHELIAL: NONE SEEN
WBC UR: NONE SEEN /HPF (ref 0–5)

## 2014-03-24 LAB — CBC WITH DIFFERENTIAL/PLATELET
Basophil #: 0.1 10*3/uL (ref 0.0–0.1)
Basophil %: 1 %
EOS ABS: 0.8 10*3/uL — AB (ref 0.0–0.7)
EOS PCT: 9.3 %
HCT: 48.6 % (ref 40.0–52.0)
HGB: 16.1 g/dL (ref 13.0–18.0)
LYMPHS ABS: 2.5 10*3/uL (ref 1.0–3.6)
Lymphocyte %: 30.2 %
MCH: 30.8 pg (ref 26.0–34.0)
MCHC: 33.1 g/dL (ref 32.0–36.0)
MCV: 93 fL (ref 80–100)
Monocyte #: 0.6 x10 3/mm (ref 0.2–1.0)
Monocyte %: 7.4 %
Neutrophil #: 4.3 10*3/uL (ref 1.4–6.5)
Neutrophil %: 52.1 %
PLATELETS: 131 10*3/uL — AB (ref 150–440)
RBC: 5.23 10*6/uL (ref 4.40–5.90)
RDW: 14.1 % (ref 11.5–14.5)
WBC: 8.2 10*3/uL (ref 3.8–10.6)

## 2014-03-24 LAB — LIPASE, BLOOD: LIPASE: 228 U/L (ref 73–393)

## 2014-04-06 ENCOUNTER — Ambulatory Visit (INDEPENDENT_AMBULATORY_CARE_PROVIDER_SITE_OTHER): Payer: Medicare Other | Admitting: Podiatry

## 2014-04-06 ENCOUNTER — Ambulatory Visit: Payer: Medicare Other | Admitting: Podiatrist

## 2014-04-06 DIAGNOSIS — M79609 Pain in unspecified limb: Secondary | ICD-10-CM

## 2014-04-06 DIAGNOSIS — M79676 Pain in unspecified toe(s): Secondary | ICD-10-CM

## 2014-04-06 DIAGNOSIS — B353 Tinea pedis: Secondary | ICD-10-CM

## 2014-04-06 DIAGNOSIS — B351 Tinea unguium: Secondary | ICD-10-CM

## 2014-04-06 MED ORDER — KETOCONAZOLE 2 % EX CREA: 1.0000 "application " | TOPICAL_CREAM | Freq: Every day | CUTANEOUS | Status: DC
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
## 2014-04-06 NOTE — Patient Instructions (Addendum)
Diabetes and Foot Care Diabetes may cause you to have problems because of poor blood supply (circulation) to your feet and legs. This may cause the skin on your feet to become thinner, break easier, and heal more slowly. Your skin may become dry, and the skin may peel and crack. You may also have nerve damage in your legs and feet causing decreased feeling in them. You may not notice minor injuries to your feet that could lead to infections or more serious problems. Taking care of your feet is one of the most important things you can do for yourself.  HOME CARE INSTRUCTIONS  Wear shoes at all times, even in the house. Do not go barefoot. Bare feet are easily injured.  Check your feet daily for blisters, cuts, and redness. If you cannot see the bottom of your feet, use a mirror or ask someone for help.  Wash your feet with warm water (do not use hot water) and mild soap. Then pat your feet and the areas between your toes until they are completely dry. Do not soak your feet as this can dry your skin.  Apply a moisturizing lotion or petroleum jelly (that does not contain alcohol and is unscented) to the skin on your feet and to dry, brittle toenails. Do not apply lotion between your toes.  Trim your toenails straight across. Do not dig under them or around the cuticle. File the edges of your nails with an emery board or nail file.  Do not cut corns or calluses or try to remove them with medicine.  Wear clean socks or stockings every day. Make sure they are not too tight. Do not wear knee-high stockings since they may decrease blood flow to your legs.  Wear shoes that fit properly and have enough cushioning. To break in new shoes, wear them for just a few hours a day. This prevents you from injuring your feet. Always look in your shoes before you put them on to be sure there are no objects inside.  Do not cross your legs. This may decrease the blood flow to your feet.  If you find a minor scrape,  cut, or break in the skin on your feet, keep it and the skin around it clean and dry. These areas may be cleansed with mild soap and water. Do not cleanse the area with peroxide, alcohol, or iodine.  When you remove an adhesive bandage, be sure not to damage the skin around it.  If you have a wound, look at it several times a day to make sure it is healing.  Do not use heating pads or hot water bottles. They may burn your skin. If you have lost feeling in your feet or legs, you may not know it is happening until it is too late.  Make sure your health care provider performs a complete foot exam at least annually or more often if you have foot problems. Report any cuts, sores, or bruises to your health care provider immediately. SEEK MEDICAL CARE IF:   You have an injury that is not healing.  You have cuts or breaks in the skin.  You have an ingrown nail.  You notice redness on your legs or feet.  You feel burning or tingling in your legs or feet.  You have pain or cramps in your legs and feet.  Your legs or feet are numb.  Your feet always feel cold. SEEK IMMEDIATE MEDICAL CARE IF:   There is increasing redness,   swelling, or pain in or around a wound.  There is a red line that goes up your leg.  Pus is coming from a wound.  You develop a fever or as directed by your health care provider.  You notice a bad smell coming from an ulcer or wound. Document Released: 06/27/2000 Document Revised: 03/02/2013 Document Reviewed: 12/07/2012 [facility] Patient Information 2015 [place], [place]. This information is not intended to replace advice given to you by your health care provider. Make sure you discuss any questions you have with your health care provider. Athlete's Foot Athlete's foot (tinea pedis) is a fungal infection of the skin on the feet. It often occurs on the skin between the toes or underneath the toes. It can also occur on the soles of the feet. Athlete's foot is more likely  to occur in hot, humid weather. Not washing your feet or changing your socks often enough can contribute to athlete's foot. The infection can spread from person to person (contagious). CAUSES Athlete's foot is caused by a fungus. This fungus thrives in warm, moist places. Most people get athlete's foot by sharing shower stalls, towels, and wet floors with an infected person. People with weakened immune systems, including those with diabetes, may be more likely to get athlete's foot. SYMPTOMS   Itchy areas between the toes or on the soles of the feet.  White, flaky, or scaly areas between the toes or on the soles of the feet.  Tiny, intensely itchy blisters between the toes or on the soles of the feet.  Tiny cuts on the skin. These cuts can develop a bacterial infection.  Thick or discolored toenails. DIAGNOSIS  Your caregiver can usually tell what the problem is by doing a physical exam. Your caregiver may also take a skin sample from the rash area. The skin sample may be examined under a microscope, or it may be tested to see if fungus will grow in the sample. A sample may also be taken from your toenail for testing. TREATMENT  Over-the-counter and prescription medicines can be used to kill the fungus. These medicines are available as powders or creams. Your caregiver can suggest medicines for you. Fungal infections respond slowly to treatment. You may need to continue using your medicine for several weeks. PREVENTION   Do not share towels.  Wear sandals in wet areas, such as shared locker rooms and shared showers.  Keep your feet dry. Wear shoes that allow air to circulate. Wear cotton or wool socks. HOME CARE INSTRUCTIONS   Take medicines as directed by your caregiver. Do not use steroid creams on athlete's foot.  Keep your feet clean and cool. Wash your feet daily and dry them thoroughly, especially between your toes.  Change your socks every day. Wear cotton or wool socks. In hot  climates, you may need to change your socks 2 to 3 times per day.  Wear sandals or canvas tennis shoes with good air circulation.  If you have blisters, soak your feet in Burow's solution or Epsom salts for 20 to 30 minutes, 2 times a day to dry out the blisters. Make sure you dry your feet thoroughly afterward. SEEK MEDICAL CARE IF:   You have a fever.  You have swelling, soreness, warmth, or redness in your foot.  You are not getting better after 7 days of treatment.  You are not completely cured after 30 days.  You have any problems caused by your medicines. MAKE SURE YOU:   Understand these instructions.  Will watch your condition.  Will get help right away if you are not doing well or get worse. Document Released: 06/27/2000 Document Revised: 09/22/2011 Document Reviewed: 04/18/2011 [facility] Patient Information 2015 [place], [place]. This information is not intended to replace advice given to you by your health care provider. Make sure you discuss any questions you have with your health care provider.

## 2014-04-10 ENCOUNTER — Other Ambulatory Visit: Payer: Self-pay | Admitting: Urgent Care

## 2014-04-10 LAB — PROTIME-INR
INR: 1.1
PROTHROMBIN TIME: 13.7 s (ref 11.5–14.7)

## 2014-06-21 ENCOUNTER — Ambulatory Visit: Payer: Self-pay | Admitting: Internal Medicine

## 2014-06-21 LAB — IRON AND TIBC
IRON SATURATION: 22 %
Iron Bind.Cap.(Total): 379 ug/dL (ref 250–450)
Iron: 82 ug/dL (ref 65–175)
UNBOUND IRON-BIND. CAP.: 297 ug/dL

## 2014-06-21 LAB — FERRITIN: FERRITIN (ARMC): 37 ng/mL (ref 8–388)

## 2014-06-21 LAB — CBC CANCER CENTER
BASOS ABS: 0.1 x10 3/mm (ref 0.0–0.1)
Basophil %: 1 %
Eosinophil #: 0.7 x10 3/mm (ref 0.0–0.7)
Eosinophil %: 8.2 %
HCT: 53.1 % — ABNORMAL HIGH (ref 40.0–52.0)
HGB: 17.8 g/dL (ref 13.0–18.0)
Lymphocyte #: 3 x10 3/mm (ref 1.0–3.6)
Lymphocyte %: 34.2 %
MCH: 31.5 pg (ref 26.0–34.0)
MCHC: 33.4 g/dL (ref 32.0–36.0)
MCV: 94 fL (ref 80–100)
MONO ABS: 0.6 x10 3/mm (ref 0.2–1.0)
MONOS PCT: 6.8 %
NEUTROS ABS: 4.4 x10 3/mm (ref 1.4–6.5)
NEUTROS PCT: 49.8 %
Platelet: 121 x10 3/mm — ABNORMAL LOW (ref 150–440)
RBC: 5.63 10*6/uL (ref 4.40–5.90)
RDW: 14.4 % (ref 11.5–14.5)
WBC: 8.9 x10 3/mm (ref 3.8–10.6)

## 2014-06-22 ENCOUNTER — Ambulatory Visit: Payer: Self-pay | Admitting: Gastroenterology

## 2014-06-30 LAB — CANCER CENTER HEMATOCRIT: HCT: 49.1 % (ref 40.0–52.0)

## 2014-07-04 ENCOUNTER — Ambulatory Visit: Payer: Medicare Other | Admitting: Podiatry

## 2014-07-12 IMAGING — CT CT CHEST W/ CM
2 of 6 series · 15 of 36 positions shown, 18 images · IV contrast (omnipaque)
Comparison: None.

CT CHEST

CLINICAL DATA: 5 feet fall onto concrete injury the right side
with rib and hip pain.  Upper abdominal pain.

CT CHEST, ABDOMEN AND PELVIS WITH CONTRAST
TECHNIQUE: Multidetector CT imaging of the chest, abdomen and
pelvis was performed following the standard protocol during bolus
administration of intravenous contrast.
Contrast: 80mL OMNIPAQUE IOHEXOL 300 MG/ML  SOLN

[Series 3: routine c/a/p 5.0 st · axial · 0.90mm/px · z∈[+320,+870]mm · 12 of 132 slices shown, 15 images]
[im 11/132  mediastinal]
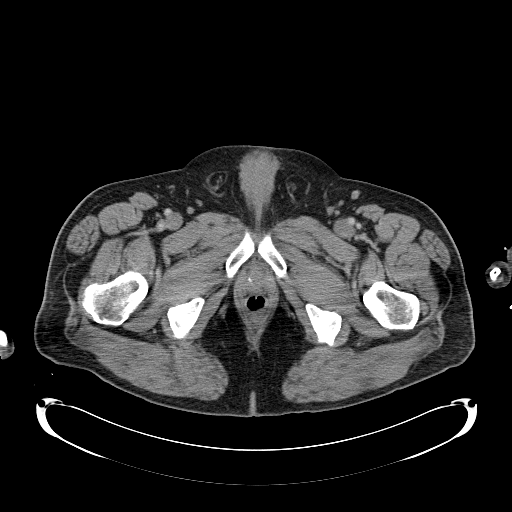
[im 11/132  lung]
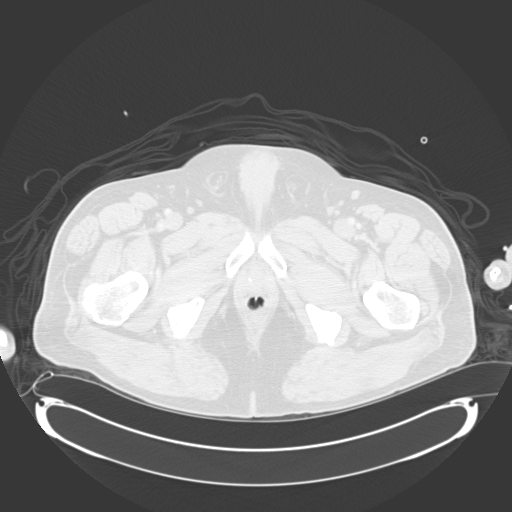
[im 21/132  lung]
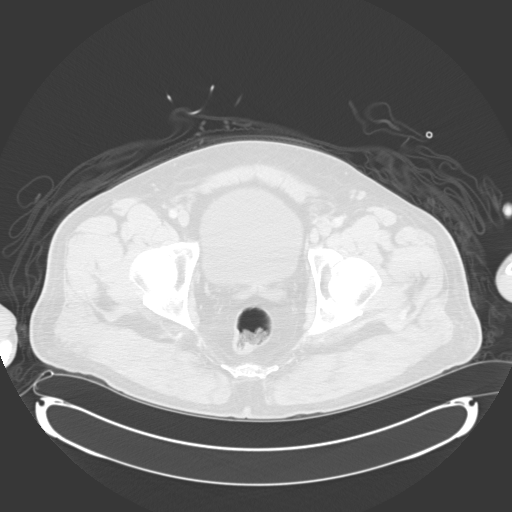
[im 31/132  lung]
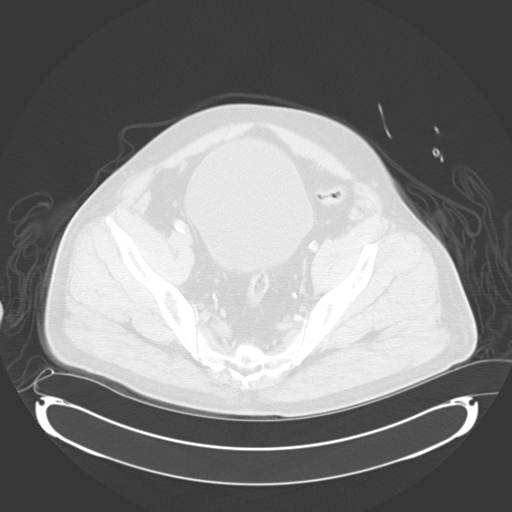
[im 41/132  lung]
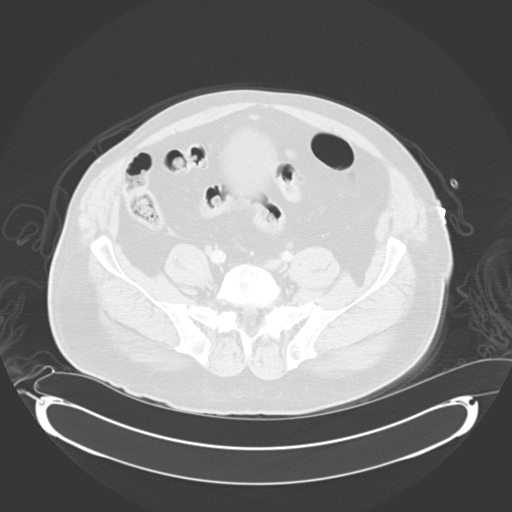
[im 51/132  mediastinal]
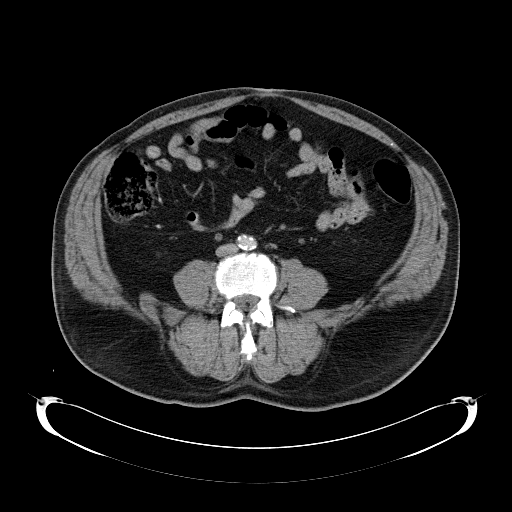
[im 51/132  lung]
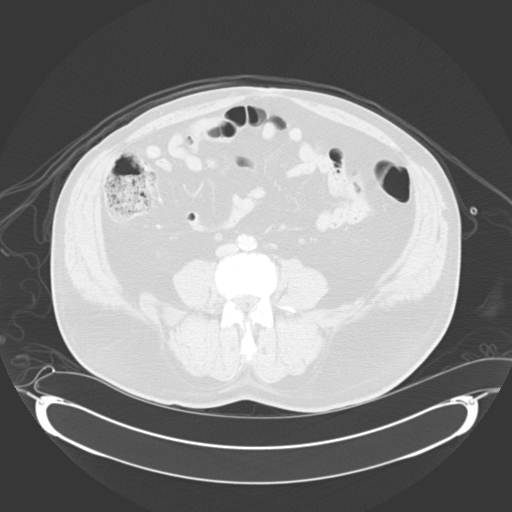
[im 61/132  lung]
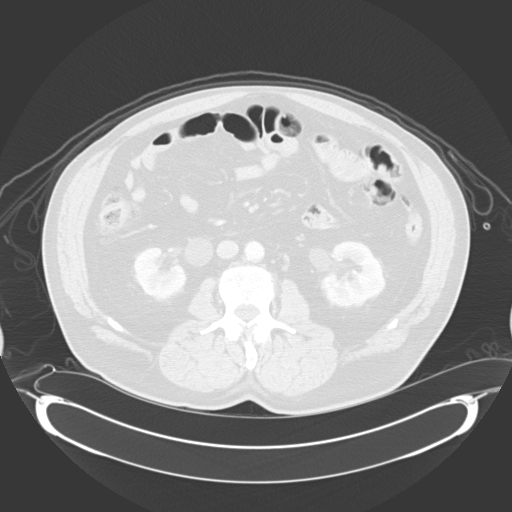
[im 71/132  lung]
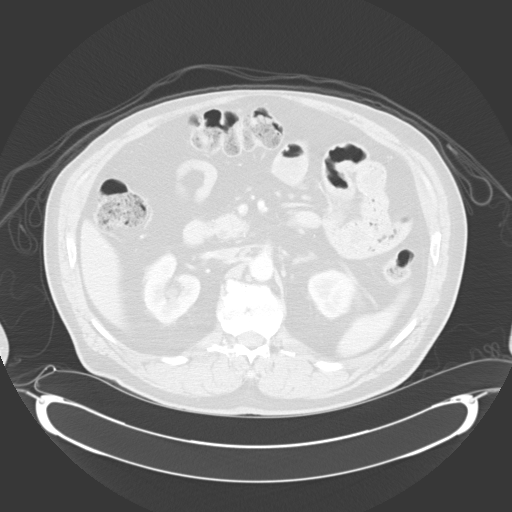
[im 81/132  lung]
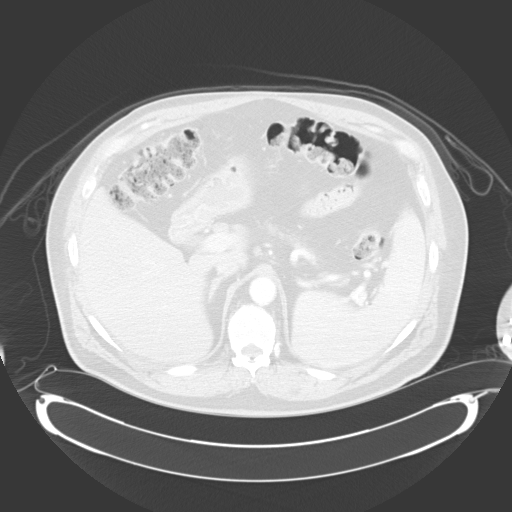
[im 91/132  mediastinal]
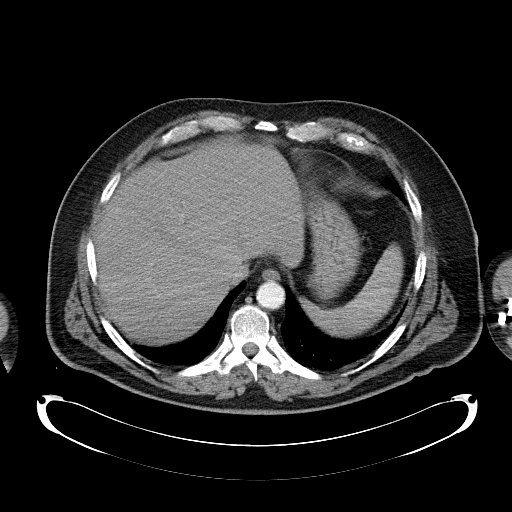
[im 91/132  lung]
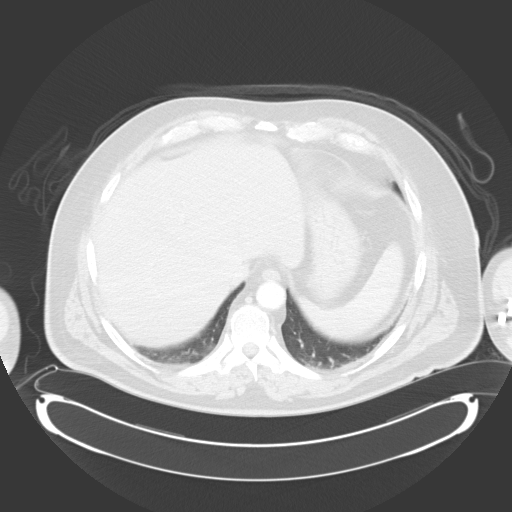
[im 101/132  lung]
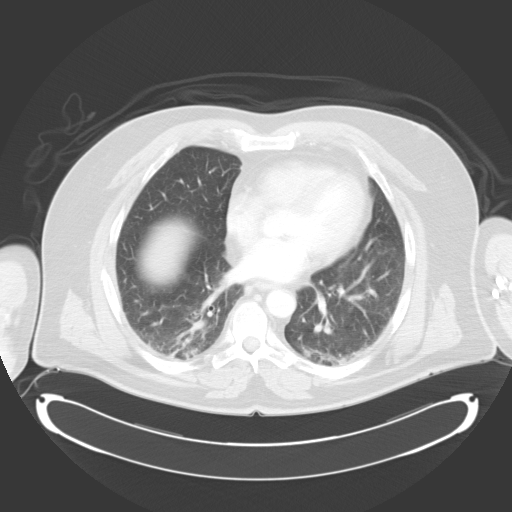
[im 111/132  lung]
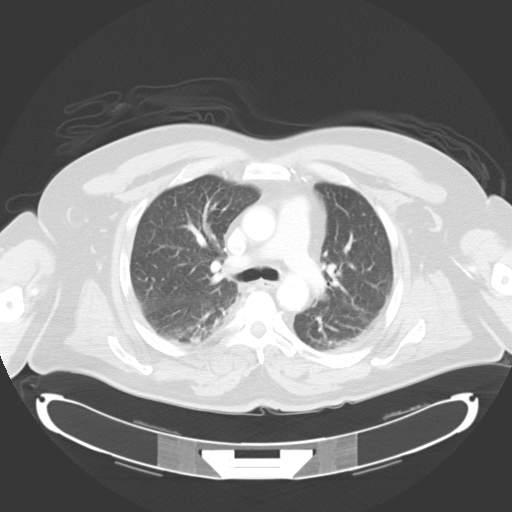
[im 121/132  lung]
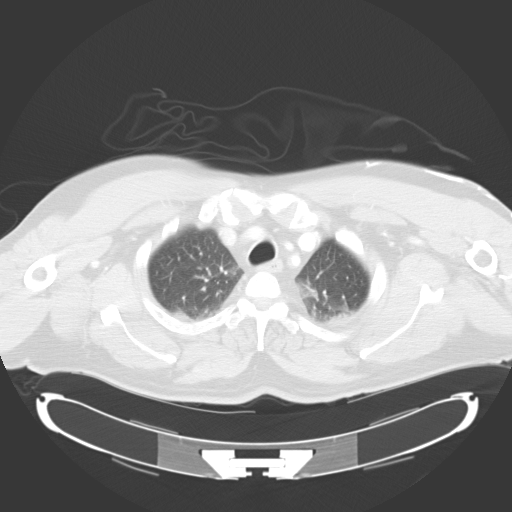

[Series 602: coronal · coronal · 1.29mm/px · 3 of 96 slices shown]
[im 20/96  lung]
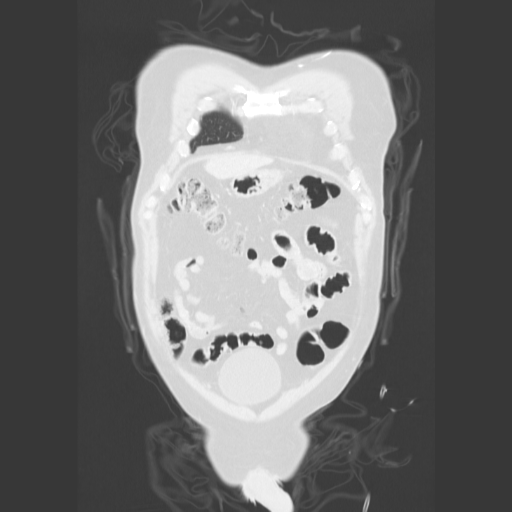
[im 39/96  lung]
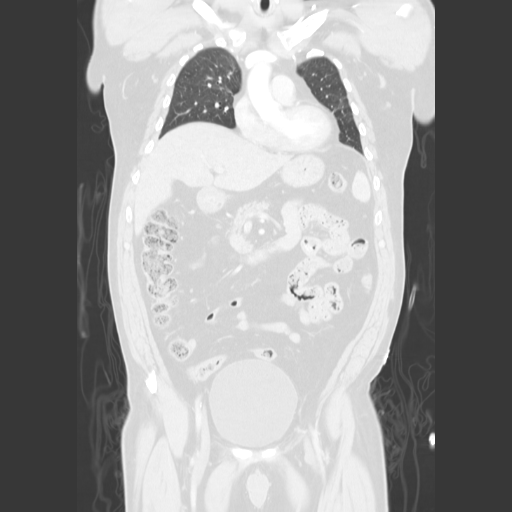
[im 58/96  lung]
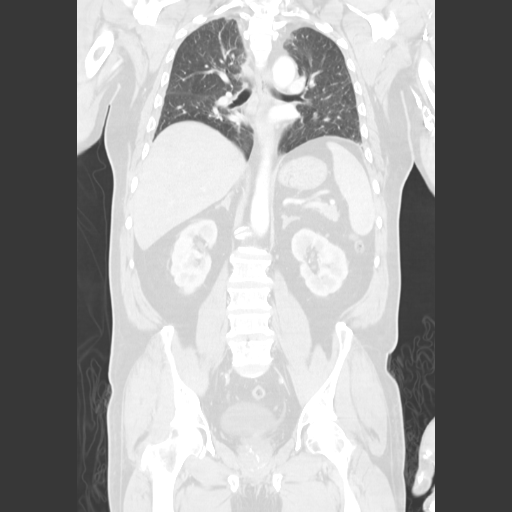

[15 of 36 positions shown; findings below may reference images not displayed]

FINDINGS: There is no axillary, mediastinal, or hilar
lymphadenopathy.  Heart size is normal.  There is no pericardial or
pleural effusion.

Lung windows show no evidence for pneumothorax.  There is some
dependent atelectasis in the posterior lungs bilaterally.  No focal
airspace consolidation or evidence of pulmonary contusion.  4 mm
pulmonary nodule is seen in the left lower lobe on image 29.

Bone windows show acute fractures of the lateral right fourth
through eighth ribs.
IMPRESSION: Acute fractures of the lateral right fourth through eighth ribs.
No evidence for segmental rib fracture.  No underlying pneumothorax
or pleural effusion.

4 mm left lower lobe pulmonary nodule. If the patient is at high
risk for bronchogenic carcinoma, follow-up chest CT at 1 year is
recommended.  If the patient is at low risk, no follow-up is
needed.  This recommendation follows the consensus statement:
Guidelines for Management of Small Pulmonary Nodules Detected on CT
Scans:  A Statement from the [HOSPITAL] as published in

CT ABDOMEN AND PELVIS
FINDINGS: The liver, spleen, stomach, duodenum, pancreas, adrenal
glands, and kidneys are unremarkable.  The patient has a prominent
extrarenal pelvis in each kidney and a duplicated collecting system
on the right.

No abdominal aortic aneurysm.  No free fluid or lymphadenopathy in
the abdomen.

Imaging through the pelvis shows no free intraperitoneal fluid.
The bladder is markedly distended soft tissue band extending from
the bladder dome to the umbilicus is consistent with the presence
of a ureteral remnant.  Prostate gland is mildly enlarged with
central dystrophic calcification.  There is no pelvic sidewall
lymphadenopathy.  Bilateral inguinal hernias contain only fat.

Diverticular changes are seen in the sigmoid colon without
diverticulitis.  The terminal ileum is unremarkable. The appendix
is not visualized, but there is no edema or inflammation in the
region of the cecum.

No evidence for fracture in the bony pelvis or lumbar spine.
IMPRESSION: No acute traumatic organ injury in the abdomen or pelvis.  No
intraperitoneal free fluid.

## 2014-07-13 LAB — CBC CANCER CENTER
BASOS ABS: 0.1 x10 3/mm (ref 0.0–0.1)
Basophil %: 1.1 %
Eosinophil #: 0.8 x10 3/mm — ABNORMAL HIGH (ref 0.0–0.7)
Eosinophil %: 8.7 %
HCT: 51.5 % (ref 40.0–52.0)
HGB: 17.4 g/dL (ref 13.0–18.0)
Lymphocyte #: 2.9 x10 3/mm (ref 1.0–3.6)
Lymphocyte %: 31.9 %
MCH: 30.8 pg (ref 26.0–34.0)
MCHC: 33.8 g/dL (ref 32.0–36.0)
MCV: 91 fL (ref 80–100)
MONO ABS: 0.5 x10 3/mm (ref 0.2–1.0)
Monocyte %: 5.2 %
Neutrophil #: 4.8 x10 3/mm (ref 1.4–6.5)
Neutrophil %: 53.1 %
Platelet: 148 x10 3/mm — ABNORMAL LOW (ref 150–440)
RBC: 5.65 10*6/uL (ref 4.40–5.90)
RDW: 13.8 % (ref 11.5–14.5)
WBC: 9 x10 3/mm (ref 3.8–10.6)

## 2014-07-14 ENCOUNTER — Ambulatory Visit: Payer: Self-pay | Admitting: Internal Medicine

## 2014-07-15 IMAGING — CR DG CHEST 2V
2 series · 2 of 2 positions shown · non-contrast
Comparison: 04/14/2012

CLINICAL DATA: Rib fractures.  Right chest pain.

CHEST - 2 VIEW

[w chest pa]
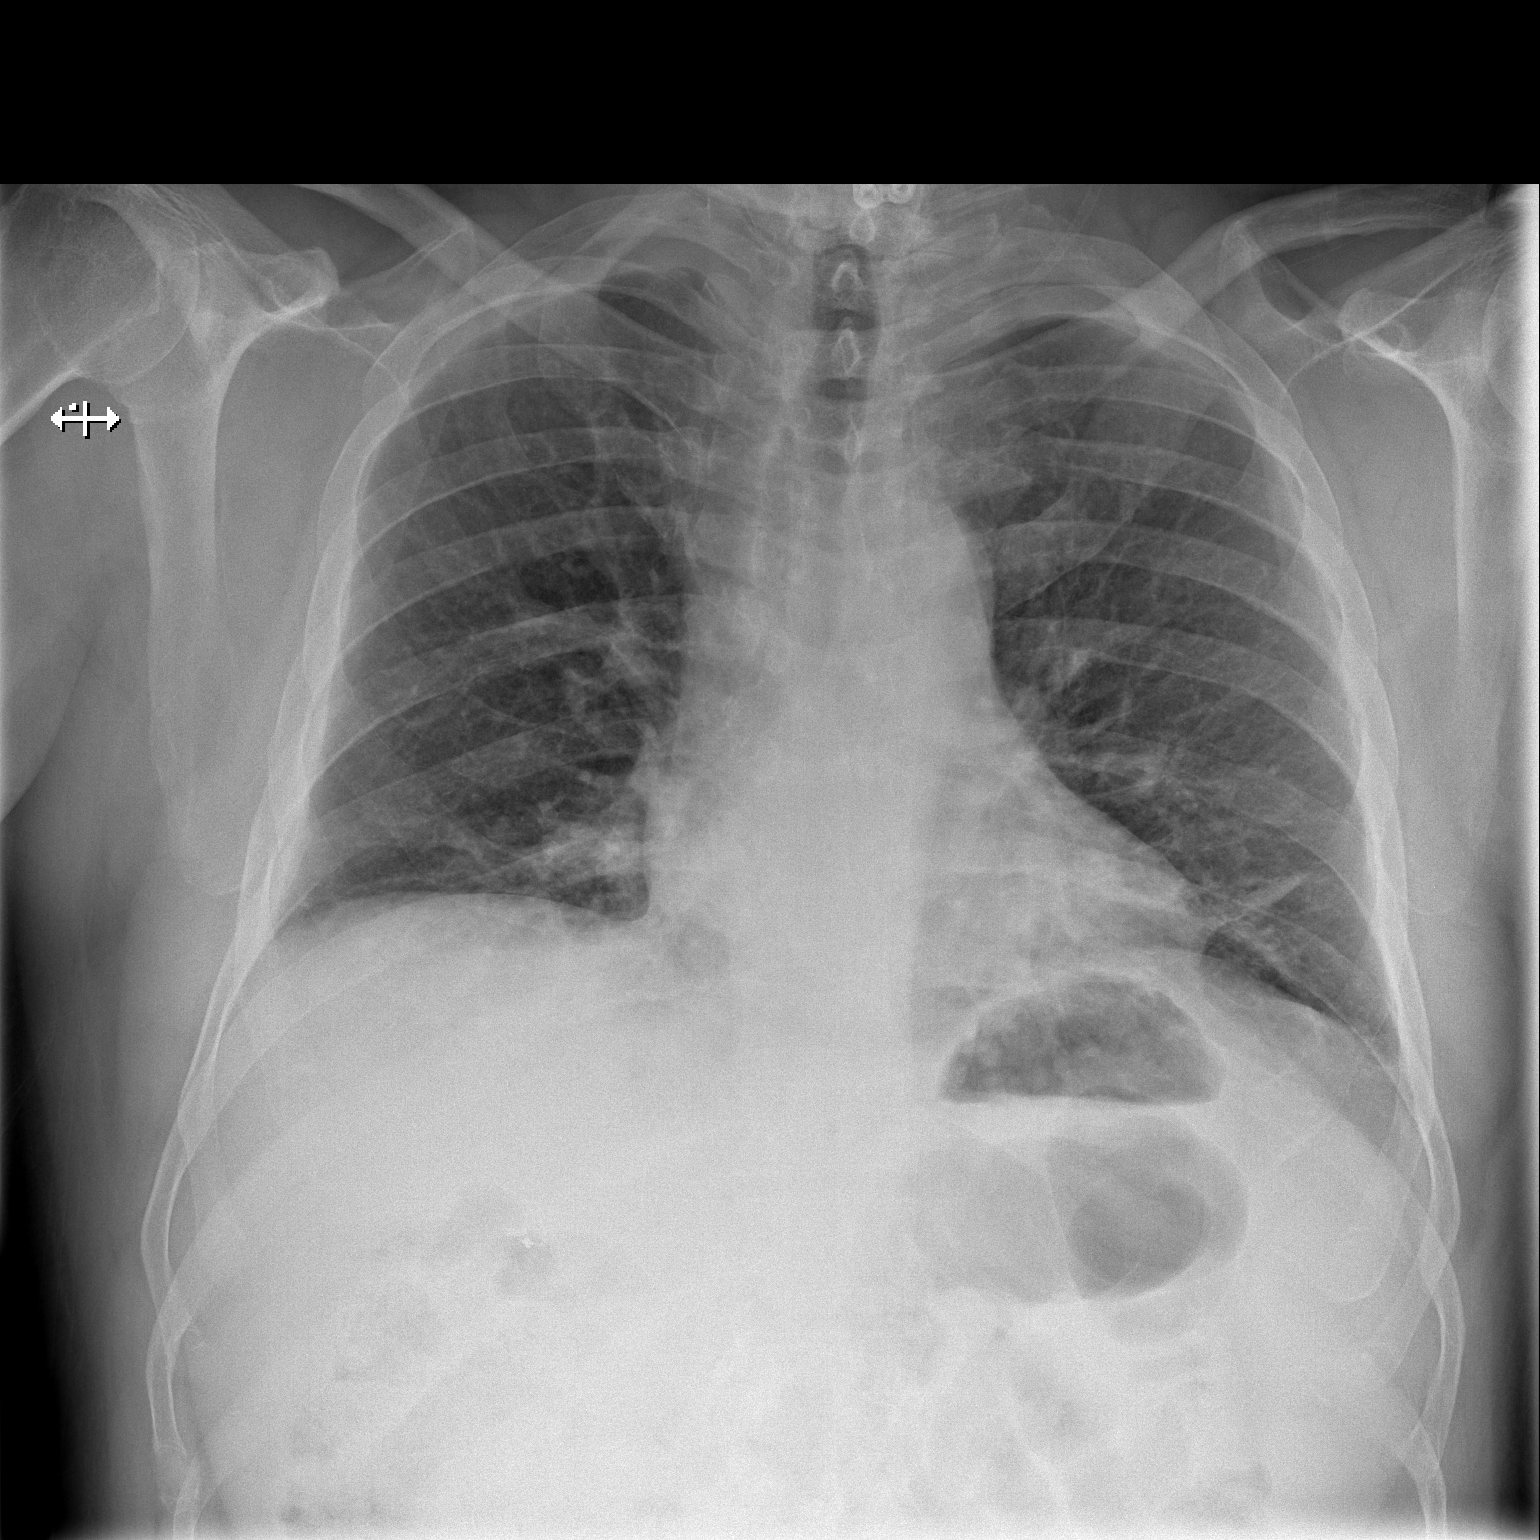

[w chest lat]
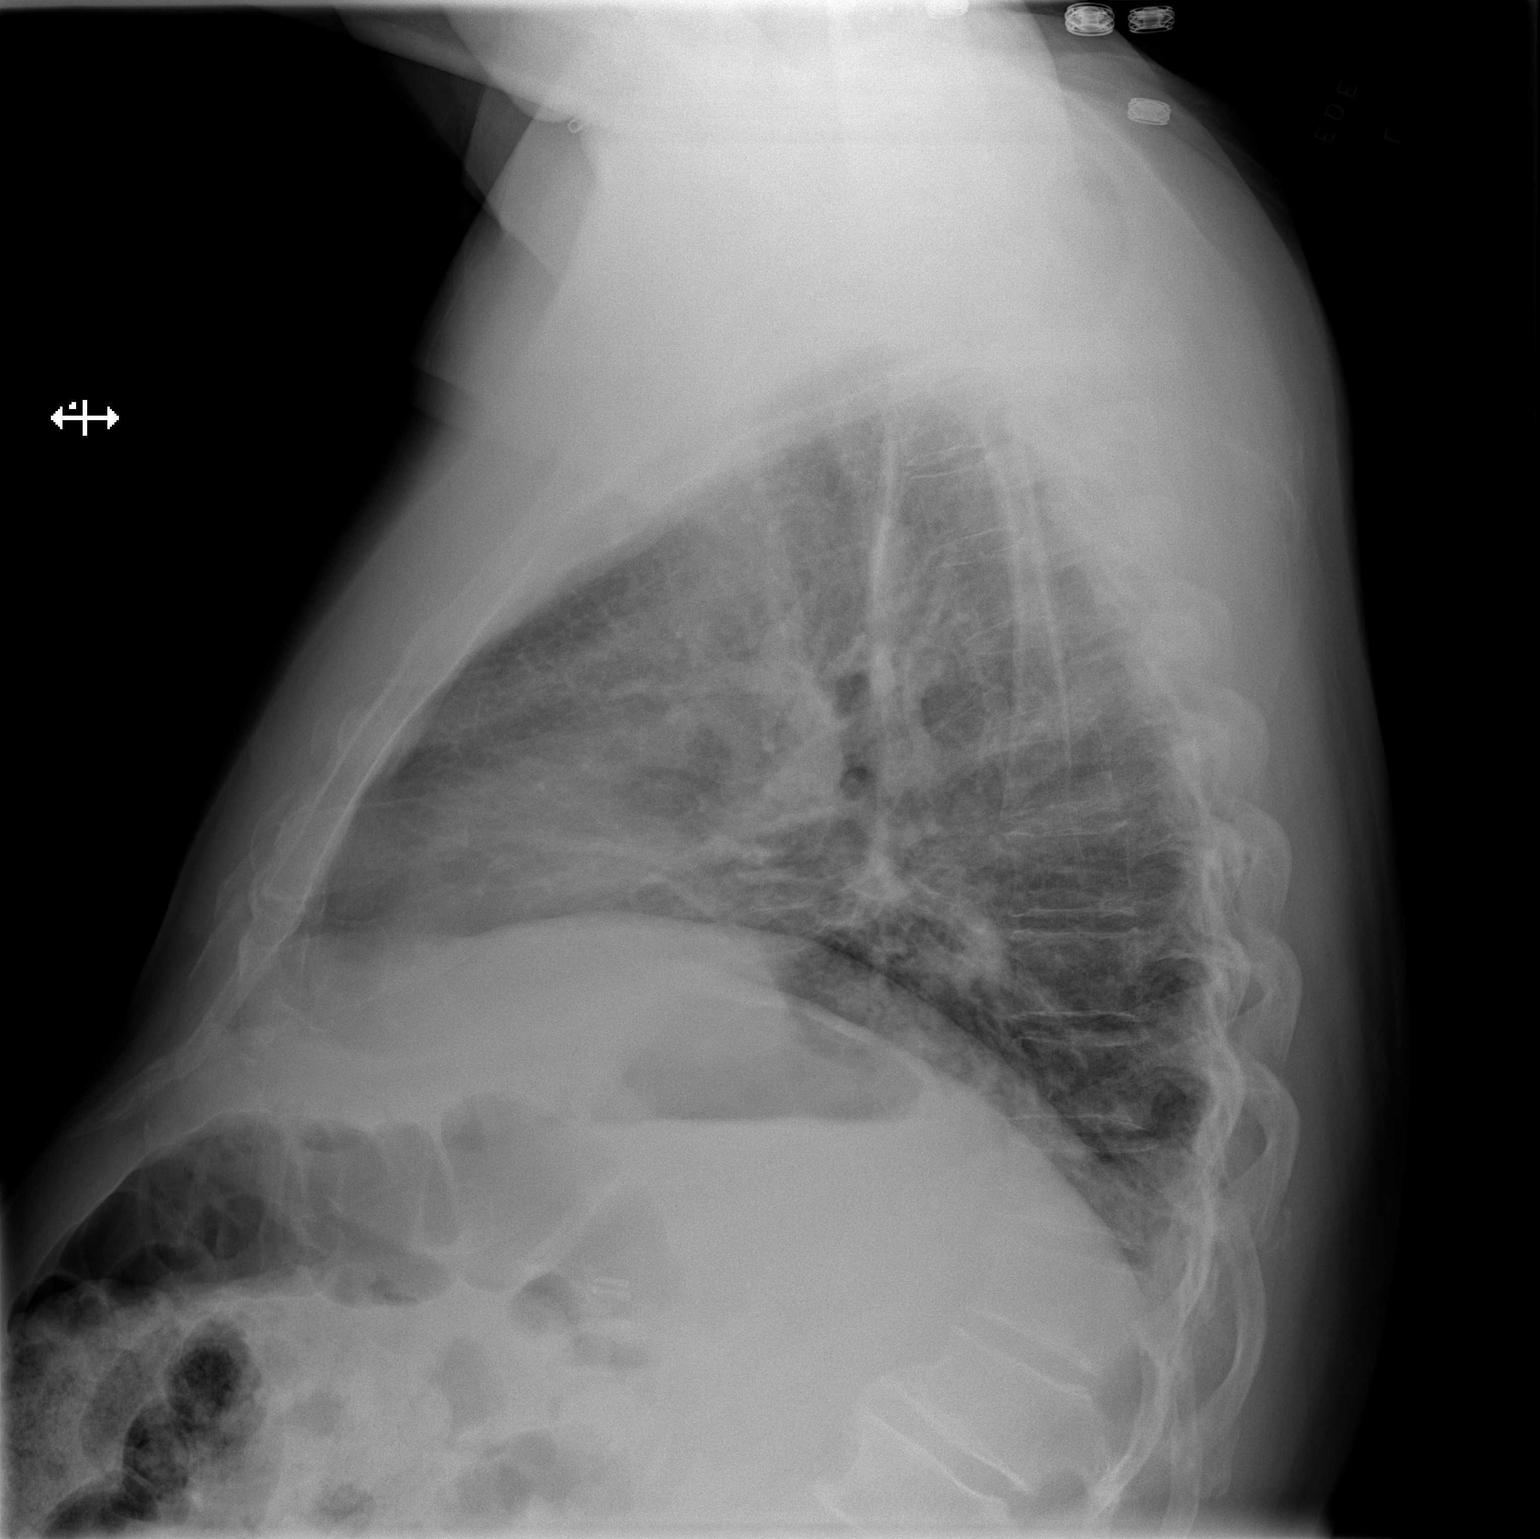

[2 of 2 positions shown; findings below may reference images not displayed]

FINDINGS: Bibasilar atelectasis, similar to prior study.  Right rib
fractures again noted.  No pneumothorax or pleural effusion.  Heart
is normal size.
IMPRESSION: Bibasilar atelectasis.  No real change.

## 2014-07-15 IMAGING — US US RENAL PORT
1 series · 14 of 25 positions shown · non-contrast
Comparison: CT abdomen and pelvis 04/12/2012.

CLINICAL DATA: 66-year-old male with acute renal failure on stage
III chronic kidney disease.

RENAL/URINARY TRACT ULTRASOUND COMPLETE

[Series 1: us renal port · 0.30mm/px · 14 of 32 slices shown]
[im 1/32]
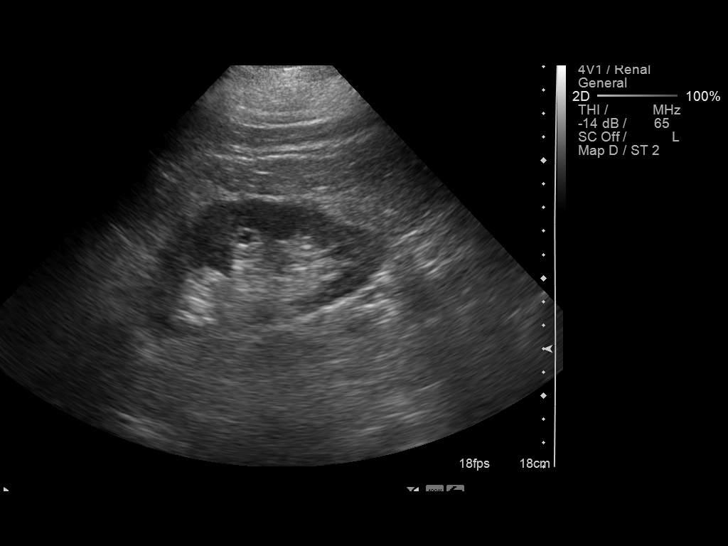
[im 3/32]
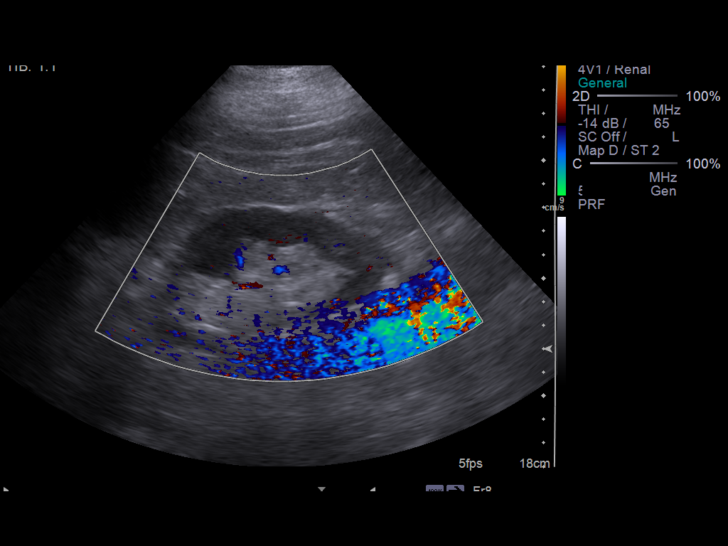
[im 6/32]
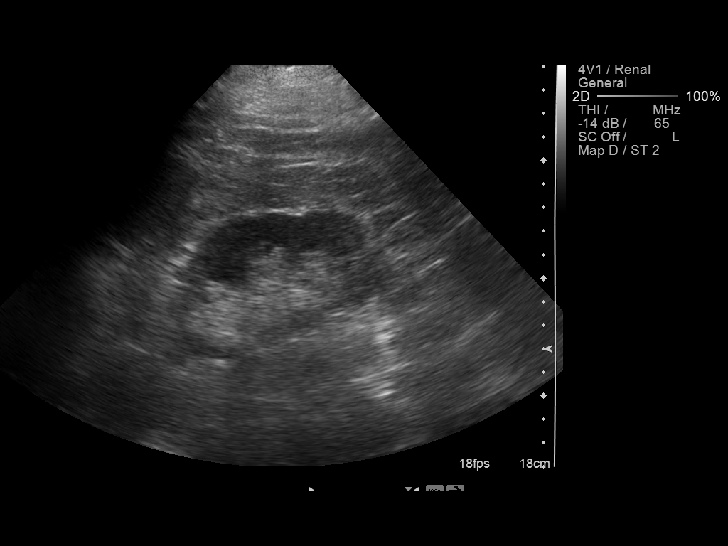
[im 8/32]
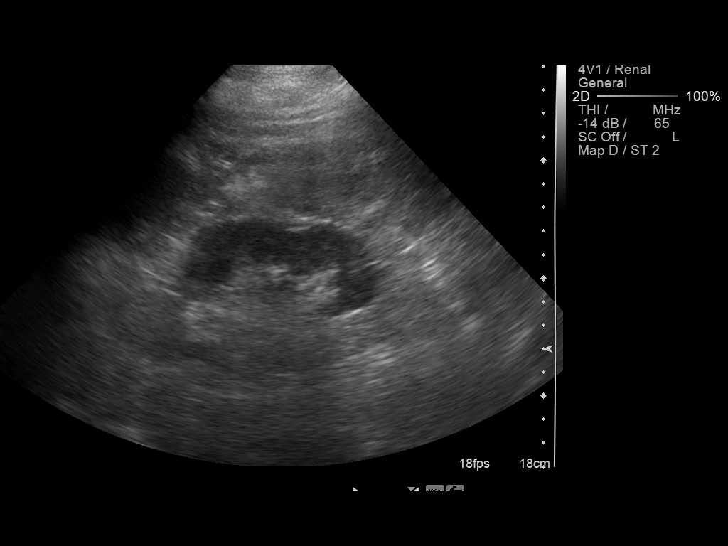
[im 11/32]
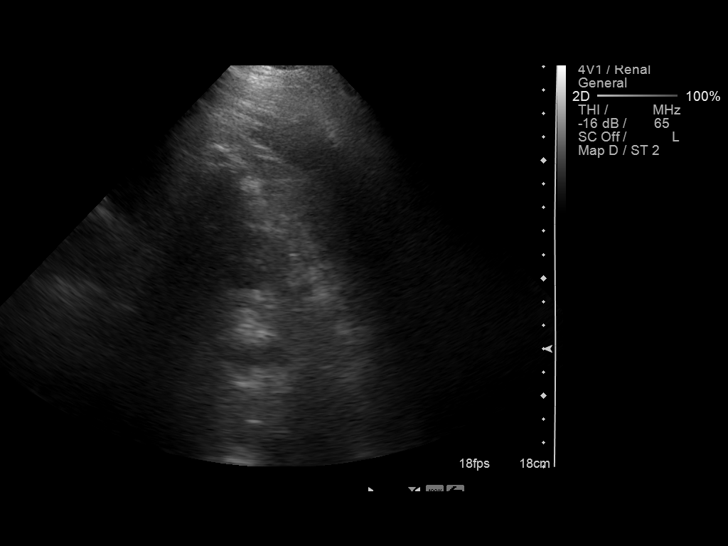
[im 12/32]
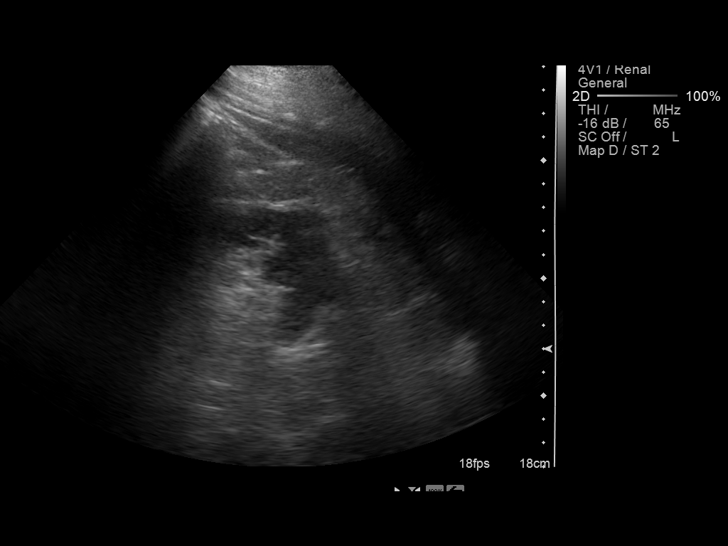
[im 15/32]
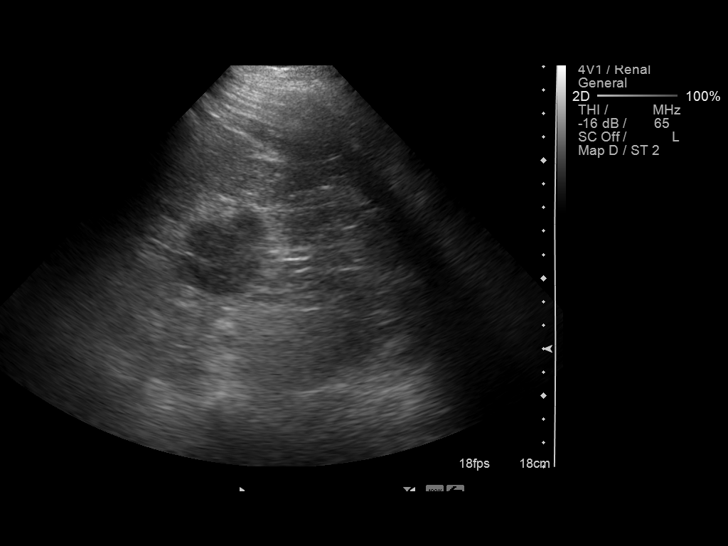
[im 17/32]
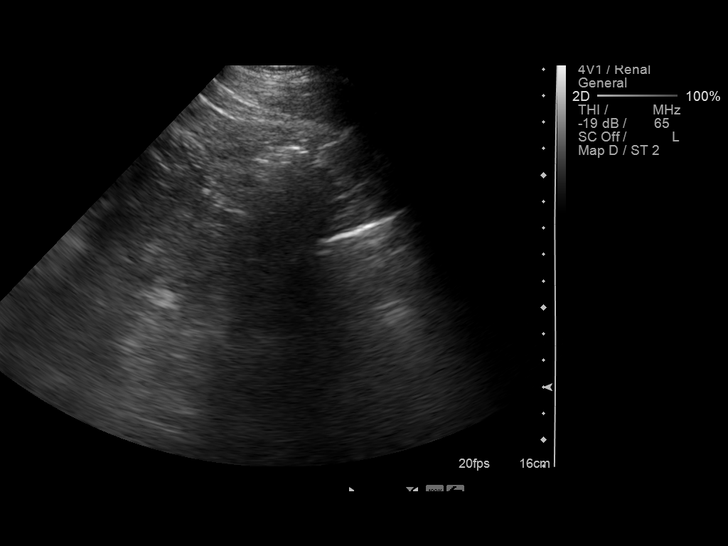
[im 20/32]
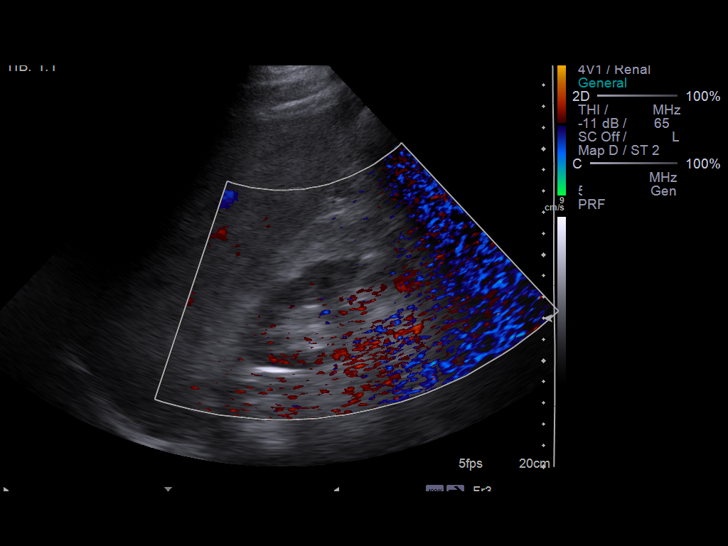
[im 21/32]
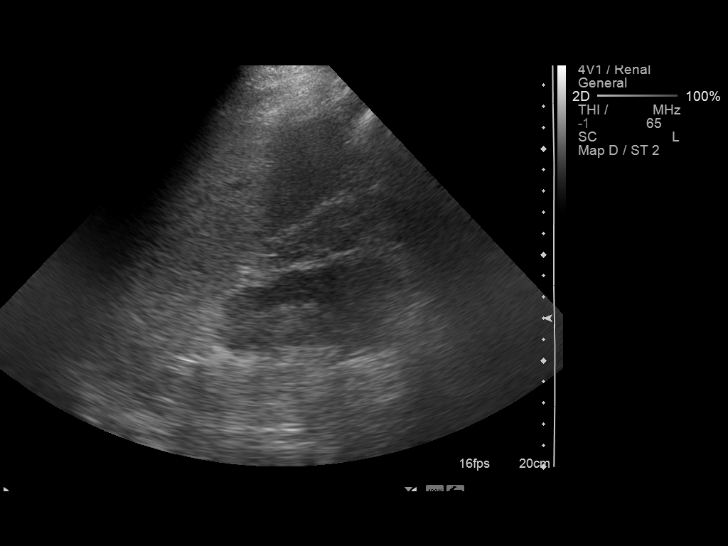
[im 24/32]
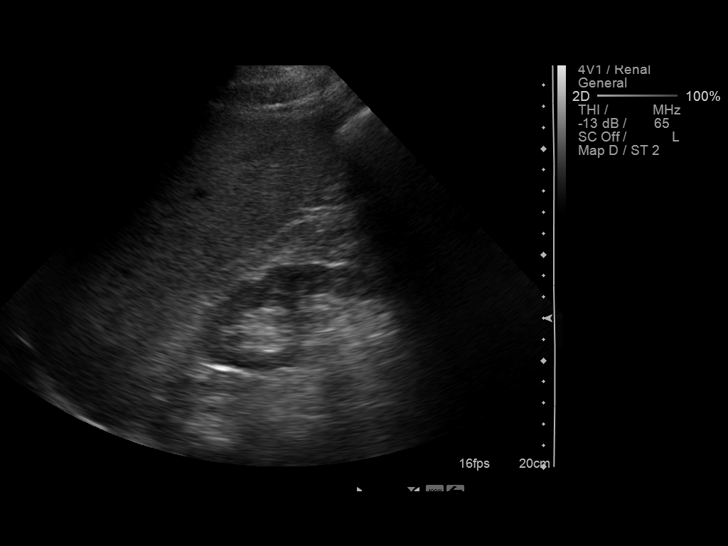
[im 26/32]
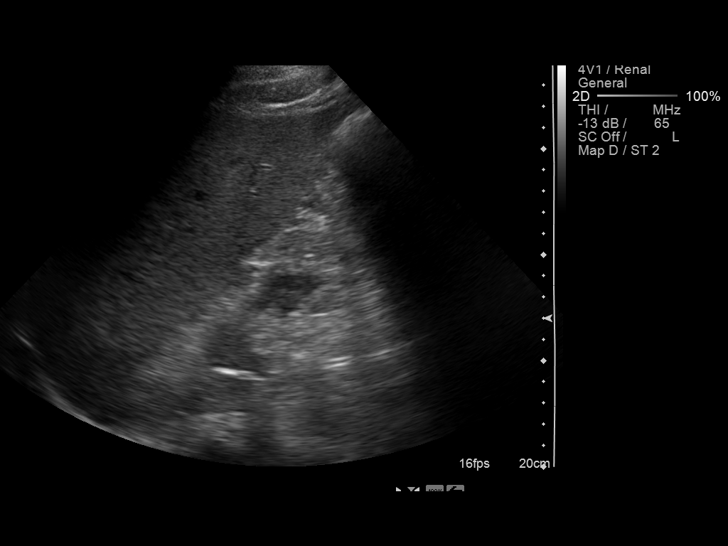
[im 29/32]
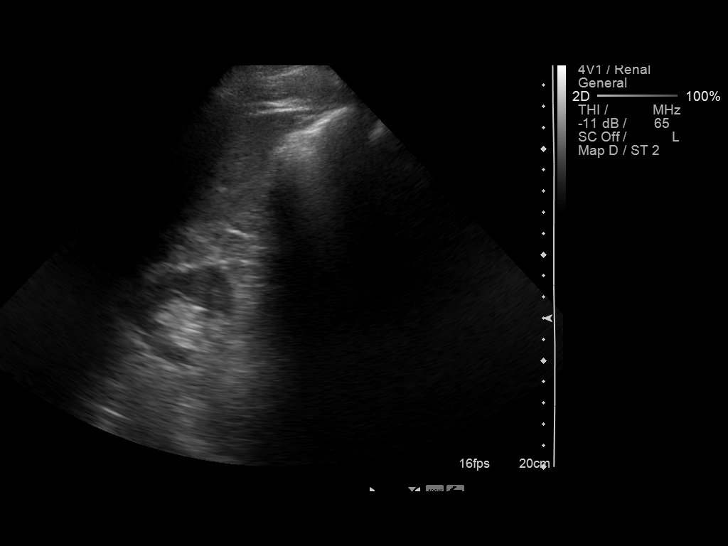
[im 32/32]
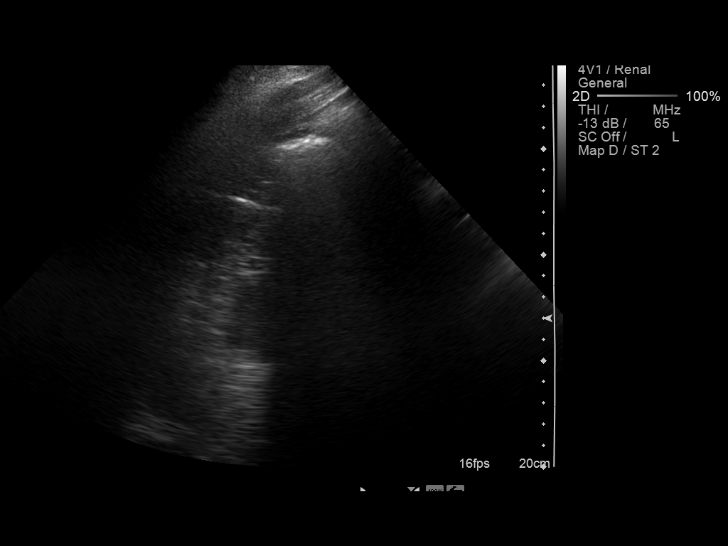

[14 of 25 positions shown; findings below may reference images not displayed]

FINDINGS: Right Kidney:  No hydronephrosis.  Renal length 11.4 cm.  Renal
cortical thinning at the poles.  Renal cortical echogenicity is
preserved.

Left Kidney:  No hydronephrosis.  Renal length 10.6 cm.  Renal
cortical thinning at the poles.  Cortical echogenicity within
normal limits.

Bladder:  Not visible, Foley catheter reportedly in place.
IMPRESSION: No acute renal findings.

## 2014-07-20 ENCOUNTER — Ambulatory Visit (INDEPENDENT_AMBULATORY_CARE_PROVIDER_SITE_OTHER): Payer: Medicare Other | Admitting: Podiatry

## 2014-07-20 DIAGNOSIS — M79676 Pain in unspecified toe(s): Secondary | ICD-10-CM

## 2014-07-20 DIAGNOSIS — B351 Tinea unguium: Secondary | ICD-10-CM

## 2014-07-20 MED ORDER — KETOCONAZOLE 2 % EX CREA: 1.0000 "application " | TOPICAL_CREAM | Freq: Every day | CUTANEOUS | Status: DC

## 2014-07-20 NOTE — Progress Notes (Signed)
Patient ID: Colton Carter., male   DOB: 12-01-1945, 69 y.o.   MRN: [id number]  Subjective: 69 year old male returns the office for follow-up evaluation in continued care of symptomatically onychomycosis. He states that his nails are painful and elongated, particularly with shoe gear. He states his his last appointment he has again followed up with the pain specialist and he has been given compression stockings for which she has been wearing. He is also been using the ketoconazole, although not consistently, for likely tinea pedis and he states it has improved some however continues to be dry appearing. He denies any recent redness or drainage from the nail sites. No other complaints at this time in no acute changes since last appointment. Denies any systemic complaints as fevers, chills, nausea, vomiting.  Objective: AAO 3, NAD DP/PT pulses palpable, CRT less than 3 seconds; there is a decrease in pulses on the left compared to the right however they are palpable. Patient also states that he has seen vascular surgery who also stated that they are aware that the left side is decreased compared to the right. Decreased protective sensation with Derrel Nip monofilament, vibratory sensation intact, Achilles tendon reflex intact. Nails hypertrophic, dystrophic, elongated, brittle, discolored without any surrounding erythema or drainage. No open lesions or pre-ulcer lesions at this time. There continues to be dry, peeling, scaly skin on the plantar aspect of the feet and interdigitally with a slight erythematous base consistent with tinea pedis. There is no increase in warmth to the area. No areas of pinpoint bony tenderness or pain with vibratory sensation bilaterally.  There is no pain with calf compression, swelling, warmth, erythema.  Assessment: 69 year old male with symptomatic onychomycosis  Plan: -Treatment options were discussed with the patient including alternatives, risks,  complications. -Nail sharply debrided 10 without complications. -Recommended to continue follow up with vascular -Discussed the importance of daily foot inspection. -Refilled ketoconazole.  -Follow-up in 3 months or sooner should any problems arise. In the meantime encouraged call the office they questioned, concerns, change in symptoms.

## 2014-08-24 ENCOUNTER — Ambulatory Visit: Payer: Self-pay | Admitting: Internal Medicine

## 2014-09-12 ENCOUNTER — Ambulatory Visit
Admit: 2014-09-12 | Disposition: A | Discharge status: Home / Self Care | Payer: Self-pay | Attending: Internal Medicine | Admitting: Internal Medicine

## 2014-10-13 ENCOUNTER — Ambulatory Visit
Admit: 2014-10-13 | Disposition: A | Discharge status: Home / Self Care | Payer: Self-pay | Attending: Family Medicine | Admitting: Family Medicine

## 2014-10-13 ENCOUNTER — Ambulatory Visit
Admit: 2014-10-13 | Disposition: A | Discharge status: Home / Self Care | Payer: Self-pay | Attending: Internal Medicine | Admitting: Internal Medicine

## 2014-10-19 ENCOUNTER — Telehealth: Payer: Self-pay | Admitting: *Deleted

## 2014-10-19 ENCOUNTER — Encounter: Payer: Self-pay | Admitting: Podiatry

## 2014-10-19 ENCOUNTER — Ambulatory Visit (INDEPENDENT_AMBULATORY_CARE_PROVIDER_SITE_OTHER): Payer: Medicare Other | Admitting: Podiatry

## 2014-10-19 DIAGNOSIS — B353 Tinea pedis: Secondary | ICD-10-CM

## 2014-10-19 DIAGNOSIS — B351 Tinea unguium: Secondary | ICD-10-CM | POA: Diagnosis not present

## 2014-10-19 DIAGNOSIS — M79676 Pain in unspecified toe(s): Secondary | ICD-10-CM

## 2014-10-19 MED ORDER — KETOCONAZOLE 2 % EX CREA: 1.0000 "application " | TOPICAL_CREAM | Freq: Every day | CUTANEOUS | Status: DC

## 2014-10-19 MED ORDER — NAFTIFINE HCL 2 % EX CREA: 1.0000 "application " | TOPICAL_CREAM | CUTANEOUS | Status: DC

## 2014-10-19 NOTE — Patient Instructions (Signed)
Diabetes and Foot Care Diabetes may cause you to have problems because of poor blood supply (circulation) to your feet and legs. This may cause the skin on your feet to become thinner, break easier, and heal more slowly. Your skin may become dry, and the skin may peel and crack. You may also have nerve damage in your legs and feet causing decreased feeling in them. You may not notice minor injuries to your feet that could lead to infections or more serious problems. Taking care of your feet is one of the most important things you can do for yourself.  HOME CARE INSTRUCTIONS  Wear shoes at all times, even in the house. Do not go barefoot. Bare feet are easily injured.  Check your feet daily for blisters, cuts, and redness. If you cannot see the bottom of your feet, use a mirror or ask someone for help.  Wash your feet with warm water (do not use hot water) and mild soap. Then pat your feet and the areas between your toes until they are completely dry. Do not soak your feet as this can dry your skin.  Apply a moisturizing lotion or petroleum jelly (that does not contain alcohol and is unscented) to the skin on your feet and to dry, brittle toenails. Do not apply lotion between your toes.  Trim your toenails straight across. Do not dig under them or around the cuticle. File the edges of your nails with an emery board or nail file.  Do not cut corns or calluses or try to remove them with medicine.  Wear clean socks or stockings every day. Make sure they are not too tight. Do not wear knee-high stockings since they may decrease blood flow to your legs.  Wear shoes that fit properly and have enough cushioning. To break in new shoes, wear them for just a few hours a day. This prevents you from injuring your feet. Always look in your shoes before you put them on to be sure there are no objects inside.  Do not cross your legs. This may decrease the blood flow to your feet.  If you find a minor scrape,  cut, or break in the skin on your feet, keep it and the skin around it clean and dry. These areas may be cleansed with mild soap and water. Do not cleanse the area with peroxide, alcohol, or iodine.  When you remove an adhesive bandage, be sure not to damage the skin around it.  If you have a wound, look at it several times a day to make sure it is healing.  Do not use heating pads or hot water bottles. They may burn your skin. If you have lost feeling in your feet or legs, you may not know it is happening until it is too late.  Make sure your health care provider performs a complete foot exam at least annually or more often if you have foot problems. Report any cuts, sores, or bruises to your health care provider immediately. SEEK MEDICAL CARE IF:   You have an injury that is not healing.  You have cuts or breaks in the skin.  You have an ingrown nail.  You notice redness on your legs or feet.  You feel burning or tingling in your legs or feet.  You have pain or cramps in your legs and feet.  Your legs or feet are numb.  Your feet always feel cold. SEEK IMMEDIATE MEDICAL CARE IF:   There is increasing redness,   swelling, or pain in or around a wound.  There is a red line that goes up your leg.  Pus is coming from a wound.  You develop a fever or as directed by your health care provider.  You notice a bad smell coming from an ulcer or wound. Document Released: 06/27/2000 Document Revised: 03/02/2013 Document Reviewed: 12/07/2012 [employer] Patient Information 2015 [employer]. This information is not intended to replace advice given to you by your health care provider. Make sure you discuss any questions you have with your health care provider.  

## 2014-10-19 NOTE — Progress Notes (Signed)
Patient ID: Colton Carter., male   DOB: [birth year], 69 y.o.   MRN: [id number]  Subjective: Colton Carter returns the office for painful, elongated, thick toenails which he is unable to trim himself. He denies any redness or drainage from around the nail sites. He has continued with ketoconazole, although he does not think it is helping. Also states his compression sock caught his left big toenail and part of the nail fell off. He states he has an appointment with vascular surgery and neurosurgery upcoming for pain in his legs which has continued after an accident last year. No other complaints at this time in no acute changes since last appointment. Denies any systemic complaints as fevers, chills, nausea, vomiting.  Objective: AAO 3, NAD DP/PT pulses palpable, CRT less than 3 seconds; there is a decrease in pulses on the left compared to the right however they are palpable.  Decreased protective sensation with Derrel Nip monofilament, vibratory sensation intact, Achilles tendon reflex intact. Nails hypertrophic, dystrophic, elongated, brittle, discolored without any surrounding erythema or drainage. There is subjective tenderness to the nails 1-5 bilaterally.  No open lesions or pre-ulcer lesions at this time. There continues to be dry, peeling, scaly skin on the plantar aspect of the feet and interdigitally with a slight erythematous base consistent with tinea pedis. There is no increase in warmth to the area. No areas of pinpoint bony tenderness or pain with vibratory sensation bilaterally.  There is no pain with calf compression, swelling, warmth, erythema.  Assessment: 69 year old male with symptomatic onychomycosis, tinea pedis   Plan: -Treatment options were discussed with the patient including alternatives, risks, complications. -Nail sharply debrided 10 without complications. -Follow up with vascular surgery and neurosurgery. -Discussed the importance of daily foot  inspection. -Prescribed naftin.  -Follow-up in 3 months or sooner should any problems arise. In the meantime encouraged call the office they questioned, concerns, change in symptoms.

## 2014-10-19 NOTE — Telephone Encounter (Signed)
naftin cream not approved , new rx for ketoconazole cream

## 2014-11-03 NOTE — Discharge Summary (Signed)
PATIENT NAME:  Colton Carter, Colton Carter MR#:  [id number] DATE OF BIRTH:  1945-11-18  DATE OF ADMISSION:  01/30/2013 DATE OF DISCHARGE:  01/31/2013  DISCHARGE DIAGNOSES:  1.  Candida balanitis.  2.  Acute renal failure over chronic kidney disease.  3.  Hypertension.  4.  Chronic pain syndrome.   CONSULTATIONS: Dr. Holley Raring of nephrology and Dr. Valma Cava of urology.   CHEST X-RAY: No acute abnormalities.   ADMITTING HISTORY AND PHYSICAL: Please see detailed H and P dictated by Dr. Posey Pronto. In brief, a 69 year old white male patient who was recently hospitalized for a Bactrim-induced acute renal failure and encephalopathy who presented to the hospital again with complaints of penile pain. The patient was found to have balanitis but also had concomitant hypotension and acute renal failure with creatinine over 2. The patient was admitted to the hospitalist service.   HOSPITAL COURSE:  1.  Acute candida balanitis. 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 The patient was started on nystatin powder along with fluconazole at discharge. Was seen by urology, Dr. Valma Cava, who has recommended repeat circumcision for the patient once his balanitis is cured with his regular urologist, Dr. Bernardo Heater. There was concern for paraphimosis which was ruled out.  2.  Acute renal failure. This is likely secondary from hypotension and ATN. The patient's lisinopril is being stopped. He has followup with Dr. Candiss Norse of nephrology on 02/14/2013 when he will have repeat blood work checked. His creatinine is back to normal with IV fluid.  3.  Hypotension. The patient will be continued on atenolol. His lisinopril is being discontinued. Also, the patient has been on high-dose Lasix 40 mg b.i.d. which is decreased to 20 mg once a day which is causing his hypotension, dehydration, and acute renal failure.   On day of discharge, the patient has no erythema on his penis on exam.   DISCHARGE MEDICATIONS:  1.   Fluconazole 100 mg oral once a day for five days.  2.  Uloric 40 mg oral once a day.  3.  NovoLog sliding scale.  4.  Testosterone 1 mg intramuscular every two weeks.  5.  Methotrexate 0.45 mL subcutaneous once a week.  6.  Alprazolam 0.5 mg two tablets oral once in the morning and 1 tablet oral at lunch, 1 tablet at bedtime.  7.  Gabapentin 600 mg oral 3 times a day.  8.  Simvastatin 40 mg oral once a day.  9.  Singulair 10 mg oral once a day.  10. spiriva 18  mcg inhaled once a day.  11.  Omeprazole 20 mg oral once a day.  12.  Flexeril 10 mg oral 2 times a day.  13.  Advair Diskus 250/50 inhaled two times a day as needed.  14.  Atenolol 25 mg oral once a day.  15.  Percocet 10/325 oral every six hours as needed.  16.  Lantus 40 units subcutaneous 2 times a day.  17.  Nystatin 100,000 units topical powder to affected area 3 times a day.  18.  Lasix 20 mg oral once a day.  19.  Diflucan 100 mg oral once a day for five days.   DISCHARGE INSTRUCTIONS: Low-sodium, carbohydrate-controlled diet. Activity as tolerated.   FOLLOWUP: Dr. Rosario Jacks, his primary care physician, who he has an appointment with on 02/01/2013. Follow up with Dr. Candiss Norse of nephrology on 02/14/2013.   Time spent on day of discharge in discharge activity was 37 minutes.   ____________________________ Leia Alf  Anaiah Mcmannis, MD srs:np D: 01/31/2013 15:00:16 ET T: 01/31/2013 20:54:03 ET JOB#: [id number]  cc: Alveta Heimlich R. Darvin Neighbours, MD, <Dictator> Isla Pence, MD Murlean Iba, MD   Neita Carp MD ELECTRONICALLY SIGNED 02/03/2013 10:22

## 2014-11-03 NOTE — Discharge Summary (Signed)
PATIENT NAME:  Colton Carter, Colton Carter MR#:  [id number] DATE OF BIRTH:  19-Apr-1946  DATE OF ADMISSION:  01/13/2013 DATE OF DISCHARGE:  01/22/2013   For a detailed note, please take a look at the history and physical done on admission by Dr. Lenore Manner.   Please also look at the interim discharge summary done Dr. Bridgette Habermann, which covers hospital course from July 3 to July 10. This is just an interim hospital course from July 10 to July 12, the day of discharge.   DIAGNOSES AT DISCHARGE: Acute hypoxic hypercarbic respiratory failure secondary to chronic obstructive pulmonary disease. Shock, likely a combination of hypovolemic and suspected septic shock. Acute on chronic renal failure. Chronic obstructive pulmonary disease. Metabolic acidosis. Diabetes. History of gout. History of diabetic neuropathy. Hyperlipidemia. Gastroesophageal reflux disease. History of rheumatoid arthritis.   DIET: The patient is being discharged on [employer] low-sodium, low-fat diet.   ACTIVITY: As tolerated.   FOLLOWUP: With Dr. Rosario Jacks in the next 1 to 2 weeks. Also follow up with Dr. Murlean Iba in the next 1 to 2 weeks.  DISCHARGE MEDICATIONS: Lasix 40 mg b.i.d., Uloric 40 mg daily, NovoLog insulin sliding scale, testosterone 1 mL intramuscular every 2 weeks, methotrexate 0.5 mL subcutaneously weekly; alprazolam 0.25 mg 2 tabs in the morning, 1 tab at lunch, 1 tab at bedtime; gabapentin 600 mg t.i.d., simvastatin 40 mg at bedtime, Singulair 10 mg daily, Spiriva 1 puff daily, omeprazole 20 mg daily, cyclobenzaprine 10 mg b.i.d., Advair 250/50 one puff b.i.d., Lantus 75 units at bedtime, Celebrex 400 mg daily, atenolol 25 mg daily, lisinopril 20 mg daily, Percocet 10/325 one tab q.6 hours as needed.   CONSULTANTS DURING HOSPITAL COURSE: Dr. Raul Del from pulmonary critical care. Dr. Candiss Norse from nephrology. Dr. Bronson Ing from general surgery.   BRIEF INTERIM HOSPITAL COURSE: This is a 69 year old male with multiple  medical problems as mentioned above, presented to the hospital on 01/13/2013 secondary to acute on chronic renal failure, altered mental status and in acute hypoxic hypercarbic respiratory failure.  1.  Acute hypoxic hypercarbic respiratory failure: The patient was initially in the Intensive Care Unit intubated. He was extubated on July 8. Post extubation, the patient has clinically done well. He has been weaned off IV steroids. He has been maintained on just some Advair and Spiriva and clinically doing well with no bronchospasm. The likely cause of his acute hypoxic hypercarbic respiratory failure was a COPD flare, which is now resolved.  2.  Shock: This was likely a combination of hypovolemic and suspected septic shock. He is clinically now much improved, hemodynamically stable. His blood and urine cultures are negative. He was first on empiric broad-spectrum antibiotics, but has been now weaned off of them and remains hemodynamically stable. He was aggressively hydrated with IV fluids for his  hypovolemic shock, which have now been discontinued as he is taking  p.o. intake well and is hemodynamically stable.  3.  Acute on chronic renal failure with severe hyperkalemia and metabolic acidosis: The patient presented to the hospital with severe metabolic acidosis from acute renal failure. His creatinine was as high as 6. He was aggressively hydrated with IV fluids and as his creatinine came down, his acidosis improved. His BUN and creatinine are now back down to baseline. He follows up with Dr. Candiss Norse. He will continue follow-up with him as an outpatient. Since his renal function has improved, he has been resumed back on his antihypertensives including his ACE inhibitors.  4.  Altered mental status/encephalopathy:  This was likely multifactorial in nature, related to metabolic encephalopathy from his hypercapnia and also his acidosis. As his acidosis and his encephalopathy have improved, his mental status is now  back down to baseline. 5.  Diabetes: The patient has had no further hypoglycemic episodes. He will continue his Lantus and sliding scale insulin as stated.  6.  Thrombocytopenia: The patient's platelet count on the day of discharge is 96. It has improved and it was as low as 60,000. The likely source of his thrombocytopenia is sepsis and his acute illness when he first presented. He had a heparin-induced antibody sent, which was negative. He has had no evidence of acute bleeding. His platelet count should be further followed as an outpatient.  7.  Acute diarrhea: This has since then improved and resolved. His C. difficile toxin has been negative.   The patient has been evaluated by physical therapy and he is ambulating well. He is being discharged with home health physical therapy and nursing services.   CODE STATUS: The patient is a full code.   TIME SPENT ON DISCHARGE: 40 minutes.   ____________________________ Belia Heman. Verdell Carmine, MD vjs:jm D: 01/22/2013 12:19:75 ET T: 01/22/2013 13:14:24 ET JOB#: [id number]  cc: Belia Heman. Verdell Carmine, MD, <Dictator> Isla Pence, MD Murlean Iba, MD Henreitta Leber MD ELECTRONICALLY SIGNED 01/24/2013 20:42

## 2014-11-03 NOTE — H&P (Signed)
PATIENT NAME:  Colton Carter, Colton Carter MR#:  [id number] DATE OF BIRTH:  07-May-1946  DATE OF ADMISSION:  01/13/2013  PRIMARY CARE PHYSICIAN: Dr. Casilda Carls.   NEPHROLOGIST: Dr. Candiss Norse.   REFERRING PHYSICIAN: Dr. Marjean Donna.   CHIEF COMPLAINT: Altered mental status and hypotension.   HISTORY OF PRESENT ILLNESS: Colton Carter is a 69 year old Caucasian male with a history of chronic kidney disease, systemic hypertension, diabetes mellitus type 2 on insulin. He was in his usual state of health until last evening when he was noticed by his girlfriend that he was lethargic and sleepy and not himself. She checked his blood sugar, and it was fine. Then, she checked his blood pressure several times, and systolic was 70. At that point, she decided to bring him to the Emergency Department where it was confirmed that he was indeed hypotensive, and there was evidence of advanced renal failure. His baseline creatinine was 1.9. Now, his creatinine is 6.6. There was hyperkalemia with potassium reaching 5.6. The patient was admitted to the hospital for further evaluation of his altered mental status and advanced renal failure and hypotension. It is worth to mention that the patient was seen recently by his primary care physician. He was treated for superficial fungal infection of the inguinal area, and there was a question whether he had a urinary tract infection or not. Therefore, he was treated with Bactrim DS twice a day orally.   REVIEW OF SYSTEMS: A 10-point system review is unobtainable due to the patient's lethargy and sleepiness. He will arouse upon stimulation, but he will go back to sleep again. I could not get adequate history from him. Most of the history confirmation is taken from his girlfriend.   FAMILY HISTORY: According to his girlfriend, there is no family history of renal failure or anybody on dialysis. According to the old medical records, the mother had diabetes mellitus and his father had cancer but was not  specified.   SOCIAL HISTORY: He is single, living with his girlfriend. He lives on disability based on his chronic back pain.   SOCIAL HABITS: Chronic smoker of 1 pack per day since the age of 60. He drinks alcohol only occasionally. He used to drink more in the past, usually vodka.   PAST MEDICAL HISTORY: Systemic hypertension, chronic kidney disease, hyperlipidemia, diabetes mellitus type 2, rheumatoid arthritis, gout, gastroesophageal reflux disease, history of kidney stones, depression, anxiety, history of chronic cervical back pain, started after having trauma to the area.   PAST SURGICAL HISTORY: Cholecystectomy, appendectomy, cervical spine fusion.   ADMISSION MEDICATIONS: I do not have the updated medication list, but according to his records he was Uloric 40 mg once a day, Spiriva 1 inhalation once a day, Singulair 10 mg a day, simvastatin 40 mg a day, Percocet 5/325 q.4 hours p.r.n., omeprazole 20 mg once a day, multivitamin once a day, methotrexate 25 mg once a week, meloxicam 7.5 mg once a day, gabapentin 600 mg 3 times a day, furosemide 40 mg twice a day, Flomax 0.4 mg once a day, atenolol 12.5 mg once a day, alprazolam or Xanax 0.25 mg 2 tablets once a day, Advair Diskus 250/50 twice a day. He is also on Lantus, reported to be Lantus 70 units in the morning and 60 units at bedtime. He is on sliding scale NovoLog.   ALLERGIES: No known drug allergies.   PHYSICAL EXAMINATION:  VITAL SIGNS: Blood pressure 77/41, repeat was 66/31. Pulse is 96, respiratory rate 16, temperature 97.8. Oxygen saturation 95%.  GENERAL APPEARANCE: Elderly male lying in bed sleeping. He is in deep sleep but arousable for a brief period of time and then he will go back to sleep.  HEAD AND NECK: No pallor. No icterus. No cyanosis. Ear examination: Difficult to assess hearing due to his sleepiness. No visible ulcers or discharge. Examination of the nose showed no bleeding, no discharge. Examination of the visible  part of the mouth showed normal lips and tongue. No ulcers. Examination of the eyes showed normal eyelids and conjunctivae. Both pupils are constricted, equal in size. I could not see reactivity to light. Neck is supple. Trachea at midline. No thyromegaly. No cervical lymphadenopathy.  HEART: Normal S1, S2. No S3, S4. No murmur. No gallop. No carotid bruits.  RESPIRATORY: Normal breathing pattern without use of accessory muscles. No rales. No wheezing.  ABDOMEN: Morbidly obese, soft without tenderness. No rigidity. No hepatosplenomegaly. No masses. No hernias.  SKIN: No ulcers. No subcutaneous nodules.  MUSCULOSKELETAL: Showed no joint swelling. No clubbing.  NEUROLOGIC: Cranial nerves II through XII were intact. No focal motor deficit.  PSYCHIATRIC: Could not assess adequately due to patient sleepiness.   LABORATORY FINDINGS: Ultrasound of the kidneys showed no evidence of stones or obstruction. Serum glucose 120, BUN 83, creatinine 6.6. Again, his baseline creatinine 1.9 a year ago. Sodium 135, potassium 5.6, calcium 7.7. Troponin less than 0.02. CBC showed white count of 11,000, hemoglobin 15, hematocrit 45, platelet count 126.  ABGs : 7.16 / 58 / 77  ASSESSMENT:  1. Altered mental status.  2. Severe hypotension.  3. Acute on chronic kidney disease reaching advanced stage.  4. Metabolic and respiratory acidosis 5. Hyperkalemia.  6. Mild thrombocytopenia.  7. Diabetes mellitus, type 2.  8. History of systemic hypertension, but now he is hypotensive.  9. His other medical problems include rheumatoid arthritis, chronic back pain, gout, history of anxiety and depression.   PLAN: Admit to the intensive care unit. Continue IV fluid challenge. The patient was already started on dopamine at the Emergency Department. I will continue that until stabilization of blood pressure. I will hold all of his home medications since he is sleepy and difficulty to arouse. Therefore, his Percocet and also the  benzodiazepine Xanax has to be held. Also, due to his hypotension, will hold his blood pressure medications. Place BIPAP and obtain CXR. I will place him on Accu-Chek and sliding scale for the time being. Will treat hyperkalemia with 1 dose of Kayexalate and 1 dose of sodium bicarbonate. I will repeat basic metabolic profile in a few hours to follow up on the creatinine and potassium. Nephrology consultation with Dr. Candiss Norse. I ordered ultrasound of the kidneys and the results as above. There is no evidence of any acute findings and no obstruction. Will send urine for urinalysis and spot urine sodium and creatinine. I spoke with his girlfriend regarding Living Will. She is unsure, and she is not sure about his code status as well. I cannot obtain adequate information from the patient, himself, due to his excessive sleepiness.   Time spent in evaluating this patient took more than 1 hour.    ____________________________ Clovis Pu. Lenore Manner, MD amd:gb D: 01/13/2013 02:20:36 ET T: 01/13/2013 02:51:34 ET JOB#: [id number]  cc: Clovis Pu. Lenore Manner, MD, <Dictator> Ellin Saba MD ELECTRONICALLY SIGNED 01/14/2013 6:24

## 2014-11-03 NOTE — Consult Note (Signed)
PATIENT NAME:  Colton Carter, Colton Carter MR#:  [id number] DATE OF BIRTH:  [birth year]  DATE OF CONSULTATION:  01/30/2013  CONSULTING PHYSICIAN:  Juliann Mule. Nobie Putnam, MD  REQUESTING PHYSICIAN:  Dr. Posey Pronto  REASON FOR CONSULTATION:  Concern for paraphimosis, distal penile edema.   HISTORY OF PRESENT ILLNESS:  The patient is a 69 year old male who is known to his primary urologist, Dr. Bernardo Heater, with a history of balanitis, presumed to be candidal. He underwent a recent hospitalization for altered mental status. He was thought to have acute renal failure due to Bactrim, which was initiated for this similar condition. He was ventilated at that time, subsequently discharged, and he was at his baseline. He said he was doing okay yesterday, and the tip of his penis became red and swollen. He did not have any fevers, but he had subjective chills. These were similar to his previous admission, which again was 3 weeks ago. He came to the ED with these symptoms. His blood pressure was 76/41, and was subsequently admitted due to worsening renal function. He is unable to retract his foreskin, and distal edema was noted. Therefore, Urology was consulted. The patient is otherwise at his baseline. Reports  no voiding symptoms, other than foul-smelling urine.   PAST MEDICAL HISTORY: 1.  Hypertension.   2.  Chronic kidney disease. 3.  Hyperlipidemia.  4.  Diabetes.  5.  Rheumatoid arthritis.   6.  Gout.  7.  GERD. 8.  Kidney stones.  9.  Depression. 10.  Anxiety.  11.  Back pain.  12.  Septic shock.   13.  Acute respiratory failure.  14.  Acute renal failure.  15.  Hypotension.  16.  Acidosis.   ALLERGIES:  LACTOSE, AZITHROMYCIN and LEVAQUIN.  CURRENT MEDICATION LIST:  Updated and reviewed in detail in the chart.  REVIEW OF SYSTEMS:  Balance of 12 systems otherwise negative, other than outlined above in the HPI.    SOCIAL HISTORY:  One pack per day of smoking.  PAST SURGICAL HISTORY:  Cholecystectomy,  appendectomy, cervical spine fusion.   FAMILY HISTORY:  Noncontributory.   PHYSICAL EXAMINATION: VITAL SIGNS ARE AS FOLLOWS:  Temperature 36.5, heart rate 56, respiratory rate 18, blood pressure 106/64, satting 99% on room air.  GENERAL EXAM:  Alert and oriented x 3, conversational, interactive.  HEENT EXAM:  Normocephalic, atraumatic.  NECK:  Supple, without any lymphadenopathy.  CHEST:  Clear, nonlabored.  CARDIOVASCULAR:  2+ upper and lower extremity pulses x 4.  ABDOMINAL EXAM:  Soft, nontender, nondistended.  SKIN:  No rash or lesion.  GENITOURINARY:  Normal bilateral descended testes. His penis has the initial appearance of a paraphimotic penis. However, the patient is circumcised and the proximal foreskin retracts over the glans quite easily. When you fully retract his foreskin proximally, there is an area of scar tissue and the swelling is all distal to this. 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 The patient does not have a paraphimotic penis. There is some whitish discharge around the glans of the penis. This is most consistent with his daily application of, I believe,  clotrimazole cream as an outpatient.  MUSCULOSKELETAL:  Five out of five strength. NEUROLOGIC:  Cranial nerves grossly intact.  PSYCHIATRIC:  Appropriate.   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 He does not have paraphimosis by my exam. He is circumcised. He does have a fair amount of redundant foreskin and some scar tissue there, and would likely benefit from circumcision, once his  acute balanitis episode resolves. If patient has been treated with topical agents most recently, could consider a 3-day course of oral Diflucan, if he fails to improve with the current nystatin regimen. The patient should follow up with his primary urologist for consideration of circumcision upon discharge.    ____________________________ Juliann Mule. Nobie Putnam, MD erh:mr D: 01/30/2013  18:11:18 ET T: 01/30/2013 21:40:08 ET JOB#: [id number]  cc: Juliann Mule. Nobie Putnam, MD, <Dictator> Carroll Sage MD ELECTRONICALLY SIGNED 01/31/2013 2:01

## 2014-11-03 NOTE — Consult Note (Signed)
Brief Consult Note: Diagnosis: candidal balanitis.   Patient was seen by consultant.   Consult note dictated.   Recommend further assessment or treatment.   Comments: called with concern for paraphimosis.  pt. is s/p circumcision, has distal edema but without paraphimosis  dict. # [id number].  Electronic Signatures: Felicity Coyer (MD)  (Signed 20-Jul-14 18:11)  Authored: Brief Consult Note   Last Updated: 20-Jul-14 18:11 by Felicity Coyer (MD)

## 2014-11-03 NOTE — Op Note (Signed)
PATIENT NAME:  Colton Carter, Colton Carter MR#:  [id number] DATE OF BIRTH:  13-Sep-1945  DATE OF PROCEDURE:  01/13/2013  PREOPERATIVE DIAGNOSIS: Respiratory failure and acute renal insufficiency.   POSTOPERATIVE DIAGNOSIS: Respiratory failure and acute renal insufficiency.   PROCEDURE:  Left internal jugular vein catheterization under ultrasound guidance.   SURGEON: Colton Goldmann, MD  ANESTHESIA:  Local.  DESCRIPTION OF PROCEDURE: With the patient in the Trendelenburg position, after induction of some sedation, the patient's left neck was prepped with ChloraPrep and draped with sterile towels. Both sides of the neck had been previously interrogated with the ultrasound. The right internal jugular vein was quite small. The left vein was much bigger and appeared to be a better site for access. Using ultrasound guidance, after sterile drapes, the vein was cannulated with a single pass and a wire passed into the great vessel system with some difficulty. It passed to 30 cm without any difficulty and then would not pass any further without some pressure. Skin was enlarged and dilator placed without difficulty. The man does have very thick neck. Dilator went without difficulty. The wire was pulled back, dilator removed and the catheter inserted again without difficulty. However, the wire did have a moderate kink in it just beneath the skin level. I was concerned with the depth of the neck that the wire may have bent entering the jugular vein. The catheter was flushed and all ports were aspirated and flushed. It was secured with a sterile dressing and chest x-ray is pending.   ____________________________ Colton Maze, MD rle:sb D: 01/13/2013 09:48:12 ET T: 01/13/2013 10:15:42 ET JOB#: [id number]  cc: Colton Goldmann III, MD, <Dictator> Colton Goldmann MD ELECTRONICALLY SIGNED 01/14/2013 8:26

## 2014-11-03 NOTE — Discharge Summary (Signed)
PATIENT NAME:  Colton Carter, PECH MR#:  [id number] DATE OF BIRTH:  1946/04/07  DATE OF ADMISSION:  01/13/2013 DATE OF SUMMARY: 01/19/2013    CONSULTANTS:  1.  Dr. Raul Del and Dr. Devona Konig from pulmonary.  2.  Dr. Candiss Norse from nephrology.  3.  Dr. Pat Patrick from surgery.   CHIEF COMPLAINT: Altered mental status and low blood pressure.   PRIMARY CARE PHYSICIAN: Dr. Rosario Jacks.  PRIMARY NEPHROLOGIST:  Dr. Candiss Norse.   CURRENT DIAGNOSES:    1.  Acute hypoxic and hypercapnic respiratory failure, likely in the setting of chronic obstructive pulmonary disease.  2.  Shock, suspected sepsis, also a possible hypovolemia.  3.  Acute on chronic renal failure with severe hyperkalemia and metabolic acidosis on arrival.  4.  Altered mental status, possibly for metabolic encephalopathy, hypercapnia, uremia and possible infection.  5.  Diabetes.  6.  Possible great vessel injury while inserting a central line.  7.  Leukopenia and thrombocytopenia, possibly from sepsis.  8.  History of chronic obstructive pulmonary disease.  9.  Hypertension.  10.  History of rheumatoid arthritis.  11.  History of gout.  12.  History of gastroesophageal reflux disease.   13.  History of nephrolithiasis.  14.  History of depression, anxiety, and chronic cervical back pain.   CURRENT MEDICATIONS: Alprazolam 0.25 mg twice daily, amlodipine 5 mg daily, famotidine 10 mg IV q. 24 hours, Lantus 25 units at bedtime, sliding scale insulin, albuterol/ipratropium 3 mL  q. 6 hours while awake, hydralazine 10 mg IV q. 4 hours p.r.n. for hypertension, morphine and Zofran p.r.n.   SIGNIFICANT LABORATORY, DIAGNOSTIC AND RADIOLOGIC DATA: Initial BUN of 83, creatinine of 6.6, sodium 135, initial potassium 5.6, peak potassium of 7.3, last potassium of 4, last BUN of 48 and creatinine of 1.26. Initial troponin negative. Initial WBC 11,  hemoglobin 15, hematocrit is a 45.4. Initial platelets was 126. Last WBC of 7.1, hemoglobin 13.6 and platelets of 77.  Lowest platelet was 63. Initial urine random sodium was 67, urine creatinine was  110.   Blood cultures on arrival: No growth to date.   Sputum cultures consistent with normal flora in 3 days.   Initial urinalysis not suggestive of infection.   Initial ABG showed pH of 7.16, pCO2 of 58, pO2 77.   Echocardiogram on July 4 showed ejection fraction of 50% to 55%, decreased LV internal cavity size.   Initial chest x-ray on July 3 showing mild infiltrate right lung base, cannot exclude a mild congestive heart failure.   Repeat x-ray on July 3 post intubation showed ET tube terminates at or just below the thoracic inlet, new homogeneous widening of the right superior mediastinum and right upper lobe cavity, opacity is likely secondary to right upper lobe collapse; however, correlate with attempted right subclavian central venous catheter placement as mediastinal hematoma from right-sided catheter attempt is much less favored. Differential consideration is interstitial edema in the left lung.   Follow-up x-rays on July 4 show mild interstitial opacity in the left lung, decrease in the right upper lobe and right paramediastinal opacity, likely related to improved aeration.   The last x-ray on June 8 of  the chest shows shallow inspiration, with likely discoid atelectasis in the left lung base, small effusion versus chronic scarring in the left.   HISTORY OF PRESENT ILLNESS AND HOSPITAL COURSE: For full details of H and P, please see the dictation on July 3 by Dr. Lenore Manner, but briefly this is a pleasant 69 year old with chronic  kidney disease, hypertension, diabetes on insulin, who was found to be lethargic, sleepy and not himself. Blood pressure was checked and it was in the 70s and he was brought in to the hospital hypotensive. His baseline creatinine is in the high 1s, low 2 and presented with a creatinine of 6.6 with hyperkalemia. Of note, the patient had recently been seen by one of his physician's  and given an antibiotic for suspected urinary tract infection, per family.  He was noted to be in acute renal failure with significant metabolic and respiratory acidosis requiring intubation. He was intubated and transferred to Intensive Care Unit.  1.  Acute hypoxic and hypercapnic respiratory failure. The patient remains intubated. He was started on broad-spectrum antibiotics and was pancultured. He does have history of chronic obstructive pulmonary disease and smoker and was started on Combivent, Flovent, stress steroids and pulmonary was onboard. The patient was ultimately extubated on July 8 and is currently on nasal cannula and has been weaned off of the steroids.  2.  Shock. The patient came in with hypotension. The patient likely had a septic shock, as when the case was discussed with the family further, he had he had some rigors and fevers in the preceding weeks and as he is not a significant complainer per family, delayed getting seen and just recently, prior to admission, was seen by a physician and given an antibiotic for possible suspected urinary tract infection. Initially, he did not have a positive urinalysis and blood cultures, which were sent, came back negative in addition to sputum cultures. Septic shock is in the differential, as he did present with some mild leukocytosis and with a background history of fevers and rigors and possible suspected urinary tract infection, he was continued on vancomycin Zosyn. He was also requiring pressors. He furthermore, did have evidence for hypovolemia and advanced renal failure. He was aggressively IV fluid resuscitated and did have a good urine output and slowly was weaned off of pressors. The septic shock likely in the setting of urinary tract infection, which was likely partially treated. At this point, he has been off of vancomycin  and Zosyn and he is afebrile and sputum cultures are no growth to date.  3.  Acute on chronic renal failure. He presented  with severe hyperkalemia and significant metabolic acidosis on arrival and was on bicarbonate drip and aggressive IV fluid resuscitation. He was seen by nephrology. This is close to resolving and he is at his baseline. The renal failure is likely secondary to suspected infection including meds, Bactrim and NSAIDs that  he is on.  4.  Altered mental status. The patient did have confusion and potentially this is metabolic encephalopathy secondary to hypercapnia and uremia and possible underlying infection. At this point, it appears to have resolved.  5.  Diabetes. He was maintained on insulin drip and currently he was transitioned back on to Lantus and sliding-scale insulin.  6.  Possible great vessel injury. This might have happened during the central line insertion. He did have serial x-rays and hemoglobin is stable. The hemoglobin did trend down in initial days; however, I believe that was secondary to hemoconcentration in the setting of hypovolemia and there is no evidence of bleed.  7.  Leukopenia/thrombocytopenia. This potentially is sepsis-related; however, we have held  the heparin and sent in a HIT panel. There is no acute bleeding and platelets coming back up and that leukopenia is resolving. PT has been consulted and the Foley has been discontinued. He  has been transferred out of the Critical Care Unit.   CODE STATUS: The patient is full code.   TOTAL TIME SPENT: 35 minutes.   ____________________________ Vivien Presto, MD sa:cc D: 01/20/2013 12:55:51 ET T: 01/20/2013 14:16:59 ET JOB#: [id number]  cc: Vivien Presto, MD, <Dictator> Isla Pence, MD Vivien Presto MD ELECTRONICALLY SIGNED 02/07/2013 14:34

## 2014-11-03 NOTE — H&P (Signed)
PATIENT NAME:  Colton Carter, Colton Carter MR#:  [id number] DATE OF BIRTH:  [birth year]  DATE OF ADMISSION:  01/30/2013  PRIMARY CARE PROVIDER: Dr. Casilda Carls.  EMERGENCY DEPARTMENT REFERRING PHYSICIAN: Dr. Benjaman Lobe.   CHIEF COMPLAINT: Hypotension; penile pain.   HISTORY OF PRESENT ILLNESS: The patient is a 69 year old white male who was recently hospitalized. At that time he presented with altered mental status and hypotension. He was noted to have acute respiratory failure as well as acute renal failure, felt to be due to Bactrim therapy. The patient had to remain on the vent for a few days. Was subsequently extubated. His renal function improved, and at the time of discharge his renal function was around 1.9. At the time of discharge his creatinine was 1.23. The patient reports that he was doing okay until yesterday when he has noticed that the tip of his penis started becoming red and swollen. He also has had some foul odor from the penis. He did not have any fevers, but had chills. The patient reports that prior to previous admission he had similar symptoms for about 3 weeks.   The patient came to the ED with these symptoms. He was noted to have a blood pressure initially at 76/41. He was also noticed to have worsening renal function. Therefore, we were asked to admit the patient. The patient reports, besides the pain in his penile region and swelling his foreskin is unable to retract back.   The patient otherwise denies any chest pains or shortness of breath. He does report difficulty with urinating. He denies any burning. He denies any abdominal pain, nausea, vomiting or diarrhea.   PAST MEDICAL HISTORY: Significant for:   1.  Hypertension.  2.  Chronic kidney disease.  3.  Hyperlipidemia.  4. Diabetes.  5.  Rheumatoid arthritis.  6.  Gout.  7.  GERD.  8.  History of kidney stones.  9.  Depression.  10.  Anxiety.  11.  History of chronic cervical back pain.  12.  History of recent septic shock,   13.  Recent acute respiratory failure, requiring intubation.  14.  Severe hypotension.  15.  Metabolic and respiratory acidosis.   ALLERGIES: The patient reports that he is allergic to LACTOSE, AZITHROMYCIN and LEVAQUIN.   CURRENT MEDICATIONS: Advair 250/50 INH b.i.d., alprazolam 0.25, 2 tablets in the morning, 1 tablet at lunch, 1 tablet at bedtime, atenolol 25 p.o. daily, Celebrex 200 daily, cyclobenazeprine 10, 1 tablet p.o. b.i.d., Lasix 40, 1 tablet p.o. b.i.d., gabapentin 600, 1 tablet p.o. t.i.d., Lantus 40 units subcutaneous b.i.d., lisinopril 20 daily, methotrexate 0.45 mL subcu once a week, NovoLog sliding-scale insulin, omeprazole 20 daily, Percocet 10/325, 1 tablet p.o. q.6 p.r.n., simvastatin 40 at bedtime, Singular 10 daily, Spiriva 18 mcg daily, testosterone IM every 2 weeks, Uloric 40 daily.   SOCIAL HISTORY: Continues to smoke 1 pack per day since the age of 61. He drinks alcohol occasionally.   PAST SURGICAL HISTORY: Status post cholecystectomy, appendectomy, cervical spine fusion.   FAMILY HISTORY: There is no family history of renal failure. Mother had diabetes. His father had cancer, but was not sure what type.   REVIEW OF SYSTEMS:  CONSTITUTIONAL: Denies any fevers. Complains of chills. Denies any fatigue or weakness. Complains of pain in his penile area. No weight loss, weight gain.  EYES: No blurred or double vision. No redness. No inflammation. No glaucoma. No cataracts.  ENT: No tinnitus. No ear pain. No hearing loss. No seasonal or year-round allergies. No  epistaxis. No difficulty swallowing.  RESPIRATORY: Denies any cough, wheezing, hemoptysis. Does have a history of COPD.  CARDIOVASCULAR: Denies any chest pain, orthopnea, edema or arrhythmia.  GASTROINTESTINAL: No nausea, vomiting, diarrhea. No abdominal pain. No hematemesis. No melena. No ulcer. No IBS. Does have GERD.  GENITOURINARY: Complains of no hematuria. Complains of some dysuria, penile pain.  ENDOCRINE:  Denies any polyuria, nocturia, or thyroid problems. Has diabetes.  HEMATOLOGIC/LYMPHATIC: Denies any major bruisability or bleeding.  SKIN: No acne. Has some erythema around his penis.  MUSCULOSKELETAL: Has chronic back pain.  NEUROLOGIC: No numbness. No CVA. No TIA. No seizures.  PSYCHIATRIC: No anxiety. No insomnia. No ADD.   PHYSICAL EXAMINATION: VITAL SIGNS: Temperature 98.4, pulse 62, respirations 18, blood pressure initially was 78/49. Currently is improved and 104/80.  GENERAL: The patient is a well-developed male, and currently not in any acute distress.  HEENT: Head atraumatic, normocephalic. Pupils are equal, round, reactive to light and accommodation. There is no conjunctival pallor. No scleral icterus. Nasal exam shows no drainage or ulceration.  OROPHARYNX: Clear, without any exudate. External ear exam shows no drainage or lesions. HEART:  Regular rate and rhythm. No gallops.  RESPIRATORY: Clear to auscultation bilaterally without any rales, rhonchi, wheezing. No accessory muscle usage.  ABDOMEN: Soft, nontender, nondistended. Positive bowel sounds x 4. There is no hepatosplenomegaly.  SKIN: There are no ulcers.  GENITOURINARY: His penis around the glans is erythematous, and some swelling. There is some whitish discharge around the glans of the penis, but not in the opening of the penis.  MUSCULOSKELETAL: There is no erythema or swelling.  NEUROLOGICAL: Cranial nerves II-XII grossly intact. No focal deficits.  PSYCHIATRIC: The patient awake, alert. Is not anxious or depressed.   EVALUATIONS IN THE ED: Shows a glucose 140, BUN 54, creatinine 2.39, sodium 137, potassium 4.5, chloride 104, CO2 is 29.   His calcium is 8.1.   LFTs: Total protein 6.0, albumin 3.1, bili total 0.4, alk phos 86, AST is 20, ALT 32.   Troponin less than 0.02. WBC 5.5, hemoglobin 12.6, platelet count 222.   ASSESSMENT AND PLAN: The patient is a 69 year old white male with a history of chronic obstructive  pulmonary disease, hypertension, chronic kidney disease, diabetes, hyperlipidemia, recently hospitalized for acute respiratory failure, acute renal failure, and septic shock, who comes to the ED with swelling of his penis, noted to have hypotension and worsening renal function.   1.  Acute-on-chronic renal failure: At this time, will get a bladder scan, hold his Lasix, ACE inhibitor. Renal consult. Give him IV fluids.  2.  Hypotension, possibly due to Lasix, ACE inhibitor, and other blood pressure medications: Will  hold for now. Will also get blood cultures.  3.  Balanitis: We will get urology evaluation. Will place him on IV ceftriaxone for the time being. We will follow his urinalysis.  4.  Diabetes: Will continue sliding-scale and Lantus.  5.  Chronic obstructive pulmonary disease: Continue his nebulizers and inhalers, as taking at home.  6.  Nicotine addiction. The patient counseled regarding smoking cessation. Nicotine patch offered. Note 4 minutes spent on that.   Time spent: Fifty minutes spent.    ____________________________ Lafonda Mosses. Posey Pronto, MD shp:dm D: 01/30/2013 14:35:21 ET T: 01/30/2013 15:08:56 ET JOB#: [id number]  cc: Tahjae Clausing H. Posey Pronto, MD, <Dictator> Alric Seton MD ELECTRONICALLY SIGNED 02/14/2013 8:56

## 2014-11-05 NOTE — Discharge Summary (Signed)
PATIENT NAME:  Colton Carter, KERINS MR#:  [id number] DATE OF BIRTH:  1946/05/20  DATE OF ADMISSION:  07/14/2011 DATE OF DISCHARGE:  07/16/2011  PRIMARY CARE PHYSICIAN: Casilda Carls, MD   CARDIOLOGIST: Lujean Amel, MD   RHEUMATOLOGIST: Doctor in [place]   NEPHROLOGIST: Dr. Candiss Norse   DISCHARGE DIAGNOSES:  1. Weakness, likely multifactorial, with low testosterone, dehydration contributing, now improving with hydration.  2. Hypotension, resolved with hydration, likely due to dehydration.  3. Acute on chronic kidney disease stage III, likely prerenal, now at his baseline, followed by Nephrology as an outpatient.  4. Anemia of chronic kidney disease, stable.  5. Uncontrolled diabetes mellitus. Will need better blood sugar control. Increased Lantus will require outpatient endocrine follow-up.   SECONDARY DIAGNOSES:  1. Diabetes.  2. Osteoarthritis/rheumatoid arthritis.  3. Gastroesophageal reflux disease.  4. Hypertension.  5. Hyperlipidemia.  6. History of kidney stones.  7. Depression.  8. Anxiety.   CONSULTATION:  Nephrology, Dr. Candiss Norse     PROCEDURES/RADIOLOGY:  1. 2-D echocardiogram on January 1st showed normal LV function, no thrombus, normal LV size, EF more than 55%, normal RV systolic function.  2. CT scan of the head without contrast on December 31st showed no acute intracranial process.  3. Chest x-ray on December 31st showed no acute cardiopulmonary disease.  4. Bilateral kidney ultrasound on December 31st showed normal ultrasound.   MAJOR LABORATORY PANEL: Urinalysis on admission was negative. Blood cultures 4 out of 4 bottles grew coagulase-negative Staph/Staph epidermidis which was likely contaminant. Serum cortisol level was within normal limits with a value of 9.1. Serum testosterone level was low with a value of 62. Influenza A plus B were negative.   HISTORY AND SHORT HOSPITAL COURSE: The patient is a 69 year old male with above-mentioned medical problems who was  admitted for generalized weakness. He was found to be hypotensive with acute on chronic kidney disease stage III. He underwent extensive work-up including echocardiogram, negative three sets of cardiac enzymes, normal TSH, negative blood cultures. Influenza was also negative. Most of his work-up was essentially negative. He underwent Nephrology evaluation by Dr. Candiss Norse who agreed that there was no acute problem. His creatinine was close to baseline. They recommended renal ultrasound which was performed with results dictated above, essentially within normal limits. The patient was still feeling weak although much improved from his admission. His blood pressure was also significantly improved. He was discharged home in stable condition.   On the date of discharge, his vital signs were as follows: Temperature 98.1, heart rate 70 per minute, respirations 20 per minute, blood pressure 139/68 mmHg. He was saturating 99% on room air.   PERTINENT PHYSICAL EXAMINATION: CARDIOVASCULAR: S1, S2 normal. No murmur, rubs, or gallop. LUNGS: Clear to auscultation bilaterally. No wheezing, rales, rhonchi, or crepitation. ABDOMEN: Soft, benign. NEUROLOGIC: Nonfocal examination. All other physical examination remained at baseline.   DISCHARGE MEDICATIONS:  1. Lasix 40 mg p.o. b.i.d. 2. Uloric 40 mg once a day.  3. NovoLog subcutaneous sliding scale insulin less than 100, no coverage; 100 to 150 blood sugar give 5 units of sub-Q insulin, 150 to 200 give 7 units; 201 to 250 give 9 units; 250 to 300 give 11 units; 301 to 350 blood sugar give 13 units; 351 to 400 sugar give 15 units of subcutaneous NovoLog.  4. Testosterone 1 mL intramuscular every two weeks.  5. Methotrexate 0.45 mL subcutaneous once a week. 6. Acetaminophen/hydrocodone 325/10 1 tablet p.o. every five hours as needed.  7. Alprazolam 0.25 mg 2  tablets p.o. every morning, 1 tablet p.o. at lunch, and 1 tablet at bedtime.  8. Gabapentin 600 mg p.o. 3 times a day.   9. Celebrex 200 mg p.o. daily.   10. Simvastatin 40 mg p.o. at bedtime.  11. Singulair once daily. 12. Spiriva once daily.  13. Multivitamin with iron 1 tablet p.o. daily. 14. Omeprazole 20 mg p.o. daily.  15. Cyclobenzaprine 10 mg p.o. b.i.d.  16. Advair 250/50 1 puff b.i.d. as needed.  17. Atenolol 12.5 mg p.o. daily.  18. Insulin Lantus 70 units sub-Q b.i.d.   DISCHARGE DIET: 1800 ADA.   DISCHARGE ACTIVITY: As tolerated.   DISCHARGE INSTRUCTIONS AND FOLLOW-UP:  1. The patient was instructed to follow-up with his primary care physician, Dr. Rosario Jacks, in 1 to 2 weeks.  2. He was instructed to hold his hydrochlorothiazide and lisinopril until seen and evaluated by his primary care physician when he can resume if instructed to do so.  3. He will need follow-up with Cardiology, Dr. Clayborn Bigness in 3 to 4 weeks. 4. Follow-up with Rheumatology in [place] as scheduled. 5. Follow-up with Nephrology, Dr. Candiss Norse, in 2 to 3 weeks. 6. Follow-up with Dr. Bernardo Heater from Urology in two weeks.  7. Follow-up with Dr. Ronnald Collum from Endocrine for diabetes mellitus management.   TOTAL TIME TAKING CARE OF THIS PATIENT: 55 minutes.  ____________________________ Lucina Mellow. Manuella Ghazi, MD vss:drc D: 07/21/2011 11:09:44 ET T: 07/22/2011 13:19:56 ET JOB#: [id number] cc: Amalio Loe S. Manuella Ghazi, MD, <Dictator>, Isla Pence, MD, Dwayne D. Clayborn Bigness, MD, Murlean Iba, MD, Scott C. Bernardo Heater, MD, Lenard Simmer, MD Remer Macho MD ELECTRONICALLY SIGNED 07/22/2011 19:52

## 2014-11-05 NOTE — H&P (Signed)
PATIENT NAME:  Colton Carter, Colton Carter MR#:  [id number] DATE OF BIRTH:  1945/09/10  DATE OF ADMISSION:  07/14/2011  REFERRING PHYSICIAN: ER physician Dr. Renee Ramus  PRIMARY CARE PHYSICIAN: Dr. Rosario Jacks  CARDIOLOGIST: Dr. Clayborn Bigness RHEUMATOLOGIST: In [place]  NEPHROLOGIST: Dr. Candiss Norse  CHIEF COMPLAINT: Weakness, low energy, forgetfulness, hypersomnia.   HISTORY OF PRESENT ILLNESS: The patient is a 69 year old male with past medical history of diabetes, rheumatoid arthritis, gastroesophageal reflux disease, hypertension, hyperlipidemia, and chronic kidney disease who went to see Dr. Clayborn Bigness for a six-month check-up. In Dr. Etta Quill office the patient appeared to be ill. He complained of no energy, being forgetful, and not acting himself, having cold sweats, and sleeping a lot. Therefore, Dr. Clayborn Bigness recommended that the patient come to the Emergency Room. The patient reports that he has been sick for more than a month. Initially it started as upper respiratory infection symptoms with cold runny nose, and congestion, for which the patient has completed a course of antibiotics. However, he says that he never really recovered from the initial episode of illness. The patient reports that he has been seeing Dr. Candiss Norse for his kidneys for more than two years. Initially he had a GFR of 51. During his last visit, which was on 12/03, his eGFR was found to be 36. No further intervention was recommended by the nephrologist. In the Emergency Room the patient was found to have progressive renal failure and hypotension.   ALLERGIES: No known drug allergies.   PAST MEDICAL/SURGICAL HISTORY:  1. Diabetes.  2. Osteoarthritis/rheumatoid arthritis.  3. Gastroesophageal reflux disease.  4. Hypertension.  5. Hyperlipidemia.  6. History of kidney stones.  7. Depression.  8. Anxiety.  9. Cholecystectomy.  10. Cervical spine fusion.  11. Appendectomy.   MEDICATIONS:  1. Advair 250/50, 1-2 puffs b.i.d.  2. Spiriva 1 puff  daily.  3. Lantus 45 units in the a.m. and 60 units at bedtime.  4. NovoLog insulin sliding scale. 5. Testosterone 200 mg/ml q. 2 weekly. 6. Methotrexate 25 mg weekly.  7. Hydrocodone/APAP 10/325, 1 tablet q. 5 hours p.r.n.  8. Xanax 0.5 mg in a.m., 0.25 mg at lunch, and 0.25 mg at bedtime.  9. Neurontin 600 mg t.i.d. 10. Celebrex 200 mg daily.  11. Lasix 40 mg b.i.d.  12. Uloric 40 mg daily.  13. Simvastatin 40 mg b.i.d.   14. Atenolol 25 mg daily.  15. Lisinopril 20/25 mg daily.  16. Omeprazole 20 mg daily.  17. Multivitamin with iron 1 tablet daily.  18. Flexeril 10 mg b.i.d.   SOCIAL HISTORY: The patient reports that he quit smoking two months ago and drinks occasional alcohol. He lives with his girlfriend. He is disabled.   FAMILY HISTORY: Mother had diabetes and heart disease. Father had cancer.    REVIEW OF SYSTEMS: CONSTITUTIONAL: The patient reports weakness and fatigue. Denies any fever or recent weight changes. EYES: Denies any blurred or double vision. ENT: Denies any tinnitus or ear pain. RESPIRATORY: Denies any painful respiration, wheezing, or cough. CARDIOVASCULAR:  No chest pain, vertigo, or fainting. GI: Denies any nausea, vomiting, diarrhea, or abdominal pain. GU: Denies any nocturia or pyuria or hematuria.  MUSCULOSKELETAL:  Has rheumatoid arthritis and gout. Denies any back pain or muscle pain. INTEGUMENT: Denies any acne eruptions or cyanosis. NEUROLOGICAL: Denies any fainting, blackouts, or seizures. PSYCH: Has history of anxiety and depression.  Reports sleeping a lot. Denies any headache. ENDOCRINE: Denies any thyroid problems, heat or cold intolerance. HEME/LYMPH: Denies any anemia or easy bruisability.  PHYSICAL EXAMINATION:  VITAL SIGNS: Temperature 98.5, heart rate 96, respiratory rate 20, blood pressure 85/59, pulse oximetry 96% on room air.   GENERAL: The patient is a 69 year old Caucasian male who is lying in bed. He appears very weak, fatigued, and tired.    HEAD: Atraumatic, normocephalic.   EYES: There is mild pallor. No icterus or cyanosis. Pupils equal, round, and reactive to light and accommodation. Extraocular movements intact.   ENT: Dry mucous membranes. No oropharyngeal erythema or thrush.   NECK: Supple. No masses. No JVD. No thyromegaly or lymphadenopathy.   CHEST WALL: No tenderness to palpation. Not using accessory muscles of respiration. No intercostal retractions.   LUNGS: Bilaterally clear to auscultation. No wheezing, rales, or rhonchi.   CARDIOVASCULAR: S1, S2 regular. No murmur, rubs, or gallops.   ABDOMEN: Soft, nontender, nondistended. No guarding or rigidity. No organomegaly. Normal bowel sounds.   SKIN: No rashes or lesions. No pedal edema. 2+ pedal pulses.   MUSCULOSKELETAL: No cyanosis or clubbing.   NEUROLOGIC: Awake, alert, and oriented times three. Nonfocal neurological exam. Cranial nerves grossly intact.   PSYCH: The patient appears to be depressed and fatigued.  LABORATORY, DIAGNOSTIC, AND RADIOLOGICAL DATA:  Influenza A and B are negative.  Chest x-ray shows no acute cardiopulmonary pathology. CT scan of the head shows no acute intracranial pathology. White count normal 8.4, hemoglobin 11.9, hematocrit 35.3, platelets 267, glucose 194, BUN 29, creatinine 3.14. Normal electrolytes. Normal LFTs. Cardiac enzymes negative.  ASSESSMENT AND PLAN: 69 year old male with history of diabetes, hyperlipidemia, hypertension, and chronic kidney disease who presents with hypotension, progressive renal failure, low energy, forgetfulness, and hypersomnia.  1. Multiple nonspecific complaints including low energy, forgetfulness, intermittent confusion and cold sweats, hypersomnia:  Possibly multifactorial. However, the etiology is not clear at present. We will check blood cultures to rule out any underlying infectious pathology since the patient is on immunosuppressants. We will also check a testosterone level since the  patient is on testosterone supplementation. We will check a TSH and cortisol level. 2. Hypotension: The patient's blood pressure was borderline low. We will hydrate with IV fluids and hold all his antihypertensive medications including atenolol, lisinopril/HCTZ, and Lasix. Hypotension could also be possibly contributing to the patient's presenting symptoms.  3. Acute on chronic kidney disease: The patient reports that his GFR has fallen from 63 to 36 in the last two years. As per last medical records, the patient had normal renal function in April 2011. We will obtain a nephrology consultation renal ultrasound, avoid nephrotoxic medications. Monitor strict ins and outs. 4. Anemia:  This is possibly of chronic disease and chronic kidney disease. The patient's hemoglobin is 11.9.  It could also be possibly contributing to the patient's presenting symptoms; however, it low enough for the patient to receive any blood transfusion.  5. Diabetes: The patient's last hemoglobin A1c was 7.3 in April 2011. We will continue the patient's current insulin regimen, place on a carbohydrate-controlled diet. Check a hemoglobin A1c.  6. Hyperlipidemia:  We will check fasting lipid profile and continue statin therapy.  7. History of gout: We will continue Uloric.  8. We will place on GI and DVT prophylaxis.  9. I reviewed all medical records and discussed with the patient the plan of care and management.   TIME SPENT: 75 minutes.   ____________________________ Cherre Huger, MD sp:bjt D:  07/14/2011 17:32:40 ET          T: 07/15/2011 08:09:52 ET  JOB#:  [id number]  cc: Isla Pence, MD Cherre Huger MD ELECTRONICALLY SIGNED 07/15/2011 16:59

## 2014-11-10 ENCOUNTER — Ambulatory Visit: Admit: 2014-11-10 | Disposition: A | Discharge status: Home / Self Care | Payer: Self-pay | Admitting: Family Medicine

## 2014-11-10 LAB — CBC CANCER CENTER
Basophil #: 0.1 x10 3/mm (ref 0.0–0.1)
Basophil %: 0.8 %
Eosinophil #: 0.5 x10 3/mm (ref 0.0–0.7)
Eosinophil %: 5.2 %
HCT: 50.9 % (ref 40.0–52.0)
HGB: 17.8 g/dL (ref 13.0–18.0)
Lymphocyte #: 2.8 x10 3/mm (ref 1.0–3.6)
Lymphocyte %: 29.2 %
MCH: 32.3 pg (ref 26.0–34.0)
MCHC: 34.9 g/dL (ref 32.0–36.0)
MCV: 93 fL (ref 80–100)
MONOS PCT: 5 %
Monocyte #: 0.5 x10 3/mm (ref 0.2–1.0)
NEUTROS PCT: 59.8 %
Neutrophil #: 5.8 x10 3/mm (ref 1.4–6.5)
Platelet: 115 x10 3/mm — ABNORMAL LOW (ref 150–440)
RBC: 5.5 10*6/uL (ref 4.40–5.90)
RDW: 14.2 % (ref 11.5–14.5)
WBC: 9.7 x10 3/mm (ref 3.8–10.6)

## 2014-11-23 ENCOUNTER — Telehealth: Payer: Self-pay | Admitting: *Deleted

## 2014-11-23 NOTE — Telephone Encounter (Signed)
Notified patient of LDCT lung cancer screening results of Lung [facility] 2.Finding with recommendation for 12 month follow up imaging. Also notified of incidental finding of coronary artery atherosclerotic changes and spleenomegaly. Patient verbalizes that he has been told about his spleen before and that he has a cardiologist that follows him with cardiac caths periodically.

## 2014-11-24 ENCOUNTER — Telehealth: Payer: Self-pay | Admitting: *Deleted

## 2014-11-24 NOTE — Telephone Encounter (Signed)
Left a message~as discussed with Dr. Inez Pilgrim, that his CT scan was fine (showed little spots that looked benign) and recommends a repeat scan in 1 year.Marland KitchenMarland Kitchen

## 2014-12-12 ENCOUNTER — Inpatient Hospital Stay: Payer: Medicare Other | Attending: Family Medicine

## 2014-12-12 ENCOUNTER — Encounter (INDEPENDENT_AMBULATORY_CARE_PROVIDER_SITE_OTHER): Payer: Self-pay

## 2014-12-12 ENCOUNTER — Other Ambulatory Visit: Payer: Self-pay | Admitting: *Deleted

## 2014-12-12 DIAGNOSIS — D72820 Lymphocytosis (symptomatic): Secondary | ICD-10-CM | POA: Insufficient documentation

## 2014-12-12 DIAGNOSIS — D751 Secondary polycythemia: Secondary | ICD-10-CM | POA: Diagnosis not present

## 2014-12-12 DIAGNOSIS — D696 Thrombocytopenia, unspecified: Secondary | ICD-10-CM | POA: Diagnosis not present

## 2014-12-12 LAB — CBC WITH DIFFERENTIAL/PLATELET
Basophils Absolute: 0.1 10*3/uL (ref 0–0.1)
Basophils Relative: 1 %
Eosinophils Absolute: 0.2 10*3/uL (ref 0–0.7)
Eosinophils Relative: 3 %
HCT: 51.3 % (ref 40.0–52.0)
HEMOGLOBIN: 17.5 g/dL (ref 13.0–18.0)
LYMPHS PCT: 28 %
Lymphs Abs: 2.6 10*3/uL (ref 1.0–3.6)
MCH: 31.8 pg (ref 26.0–34.0)
MCHC: 34.1 g/dL (ref 32.0–36.0)
MCV: 93.2 fL (ref 80.0–100.0)
MONO ABS: 0.5 10*3/uL (ref 0.2–1.0)
Monocytes Relative: 5 %
NEUTROS ABS: 5.8 10*3/uL (ref 1.4–6.5)
Neutrophils Relative %: 63 %
Platelets: 154 10*3/uL (ref 150–440)
RBC: 5.51 MIL/uL (ref 4.40–5.90)
RDW: 14.3 % (ref 11.5–14.5)
WBC: 9.3 10*3/uL (ref 3.8–10.6)

## 2015-01-08 ENCOUNTER — Other Ambulatory Visit: Payer: Self-pay

## 2015-01-10 ENCOUNTER — Encounter: Payer: Self-pay | Admitting: Family Medicine

## 2015-01-10 ENCOUNTER — Ambulatory Visit: Payer: Medicare Other

## 2015-01-10 ENCOUNTER — Inpatient Hospital Stay: Payer: Medicare Other

## 2015-01-10 ENCOUNTER — Inpatient Hospital Stay: Payer: Medicare Other | Attending: Family Medicine | Admitting: Family Medicine

## 2015-01-10 VITALS — BP 145/86 | HR 70 | Temp 98.5°F | Resp 20 | Wt 200.4 lb

## 2015-01-10 DIAGNOSIS — Z79899 Other long term (current) drug therapy: Secondary | ICD-10-CM | POA: Insufficient documentation

## 2015-01-10 DIAGNOSIS — E780 Pure hypercholesterolemia: Secondary | ICD-10-CM

## 2015-01-10 DIAGNOSIS — J449 Chronic obstructive pulmonary disease, unspecified: Secondary | ICD-10-CM | POA: Insufficient documentation

## 2015-01-10 DIAGNOSIS — E119 Type 2 diabetes mellitus without complications: Secondary | ICD-10-CM | POA: Diagnosis not present

## 2015-01-10 DIAGNOSIS — D72820 Lymphocytosis (symptomatic): Secondary | ICD-10-CM | POA: Insufficient documentation

## 2015-01-10 DIAGNOSIS — F17210 Nicotine dependence, cigarettes, uncomplicated: Secondary | ICD-10-CM | POA: Diagnosis not present

## 2015-01-10 DIAGNOSIS — G8929 Other chronic pain: Secondary | ICD-10-CM | POA: Diagnosis not present

## 2015-01-10 DIAGNOSIS — M542 Cervicalgia: Secondary | ICD-10-CM | POA: Insufficient documentation

## 2015-01-10 DIAGNOSIS — I10 Essential (primary) hypertension: Secondary | ICD-10-CM | POA: Diagnosis not present

## 2015-01-10 DIAGNOSIS — R202 Paresthesia of skin: Secondary | ICD-10-CM | POA: Diagnosis not present

## 2015-01-10 DIAGNOSIS — R200 Anesthesia of skin: Secondary | ICD-10-CM | POA: Insufficient documentation

## 2015-01-10 DIAGNOSIS — Z794 Long term (current) use of insulin: Secondary | ICD-10-CM | POA: Diagnosis not present

## 2015-01-10 DIAGNOSIS — I2510 Atherosclerotic heart disease of native coronary artery without angina pectoris: Secondary | ICD-10-CM | POA: Insufficient documentation

## 2015-01-10 DIAGNOSIS — D751 Secondary polycythemia: Secondary | ICD-10-CM

## 2015-01-10 DIAGNOSIS — M129 Arthropathy, unspecified: Secondary | ICD-10-CM | POA: Diagnosis not present

## 2015-01-10 DIAGNOSIS — R51 Headache: Secondary | ICD-10-CM | POA: Diagnosis not present

## 2015-01-10 HISTORY — DX: Secondary polycythemia: D75.1

## 2015-01-10 LAB — CBC WITH DIFFERENTIAL/PLATELET
BASOS ABS: 0.1 10*3/uL (ref 0–0.1)
Basophils Relative: 1 %
Eosinophils Absolute: 0.3 10*3/uL (ref 0–0.7)
Eosinophils Relative: 4 %
HCT: 54.2 % — ABNORMAL HIGH (ref 40.0–52.0)
HEMOGLOBIN: 18 g/dL (ref 13.0–18.0)
Lymphocytes Relative: 24 %
Lymphs Abs: 1.9 10*3/uL (ref 1.0–3.6)
MCH: 31.6 pg (ref 26.0–34.0)
MCHC: 33.1 g/dL (ref 32.0–36.0)
MCV: 95.4 fL (ref 80.0–100.0)
Monocytes Absolute: 0.5 10*3/uL (ref 0.2–1.0)
Monocytes Relative: 6 %
Neutro Abs: 5.1 10*3/uL (ref 1.4–6.5)
Neutrophils Relative %: 65 %
Platelets: 132 10*3/uL — ABNORMAL LOW (ref 150–440)
RBC: 5.68 MIL/uL (ref 4.40–5.90)
RDW: 14 % (ref 11.5–14.5)
WBC: 7.9 10*3/uL (ref 3.8–10.6)

## 2015-01-10 NOTE — Progress Notes (Signed)
[place]  [phone]  Fax:([phone]) [place]. DOB: 12/23/1945  MR#: [id number]  NLZ#:[id number]  Patient Care Team: No Pcp Per Patient as PCP - General (General Practice)  CHIEF COMPLAINT:  Chief Complaint  Patient presents with  . Follow-up    Polycythemia    INTERVAL HISTORY:  Patient is here for further follow-up and evaluation regarding polycythemia and mild lymphocytosis. Patient denies any acute complaints today other than headache lasting for approximately 2 weeks which he has had in the past when his hematocrit was high and he required phlebotomy. 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 Other than the reports headaches she denies any other complaints and is feeling well.  REVIEW OF SYSTEMS:   Review of Systems  Constitutional: Negative for fever, chills, weight loss, malaise/fatigue and diaphoresis.  HENT: Negative for congestion, ear discharge, ear pain, hearing loss, nosebleeds, sore throat and tinnitus.        2 weeks  Eyes: Negative for blurred vision, double vision, photophobia, pain, discharge and redness.  Respiratory: Negative for cough, hemoptysis, sputum production, shortness of breath, wheezing and stridor.   Cardiovascular: Negative for chest pain, palpitations, orthopnea, claudication, leg swelling and PND.  Gastrointestinal: Negative for heartburn, nausea, vomiting, abdominal pain, diarrhea, constipation, blood in stool and melena.  Genitourinary: Negative.   Musculoskeletal: Negative.   Skin: Negative.   Neurological: Positive for headaches. Negative for dizziness, tingling, focal weakness, seizures and weakness.  Endo/Heme/Allergies: Does not bruise/bleed easily.  Psychiatric/Behavioral: Negative for depression. The patient is not nervous/anxious and does not have insomnia.     As  per HPI. Otherwise, a complete review of systems is negatve.  ONCOLOGY HISTORY:  No history exists.    PAST MEDICAL HISTORY: Past Medical History  Diagnosis Date  . Coronary artery disease   . Diabetes mellitus   . Hypertension   . Arthritis   . High cholesterol   . Renal disorder   . Chronic neck pain   . Polycythemia, secondary 01/10/2015    PAST SURGICAL HISTORY: Past Surgical History  Procedure Laterality Date  . Cervical fusion      FAMILY HISTORY History reviewed. No pertinent family history.  GYNECOLOGIC HISTORY:  No LMP for male patient.     ADVANCED DIRECTIVES:    HEALTH MAINTENANCE: History  Substance Use Topics  . Smoking status: Current Every Day Smoker -- 0.25 packs/day for 50 years    Types: Cigarettes  . Smokeless tobacco: Never Used  . Alcohol Use: No     Colonoscopy:  PAP:  Bone density:  Lipid panel:  Allergies  Allergen Reactions  . Levaquin [Levofloxacin In D5w] Other (See Comments)    Kidney shut down  . Milk-Related Compounds Nausea And Vomiting  . Septra Ds [Sulfamethoxazole-Trimethoprim]     Current Outpatient Prescriptions  Medication Sig Dispense Refill  . albuterol (PROAIR HFA) 108 (90 BASE) MCG/ACT inhaler Inhale into the lungs.    Marland Kitchen albuterol (PROVENTIL HFA;VENTOLIN HFA) 108 (90 BASE) MCG/ACT inhaler Inhale into the lungs every 6 (six) hours as needed for wheezing or shortness of breath.    . ALPRAZolam (XANAX) 0.25 MG tablet Take 0.25-0.5 mg by mouth 3 (three) times daily. Takes 2 tabs in am, 1 at lunch and 1 at bedtime    . atenolol (TENORMIN) 25 MG tablet Take 25 mg by mouth daily.    Marland Kitchen  baclofen (LIORESAL) 10 MG tablet Take 10 mg by mouth 2 (two) times daily.    . Canagliflozin (INVOKANA) 300 MG TABS Take by mouth daily.    . Cholecalciferol (VITAMIN D-3) 5000 UNITS TABS Take by mouth 2 (two) times daily.    Marland Kitchen docusate sodium 100 MG CAPS Take 200 mg by mouth 2 (two) times daily.    Marland Kitchen etanercept (ENBREL SURECLICK) 50  MG/ML injection Inject 50 mg into the skin once a week.    . febuxostat (ULORIC) 40 MG tablet Take 40-80 mg by mouth See admin instructions. Take 2 tablets on one day then takes 1 tablet    . Fluticasone-Salmeterol (ADVAIR) 250-50 MCG/DOSE AEPB Inhale 1 puff into the lungs every 12 (twelve) hours.    . furosemide (LASIX) 40 MG tablet Take 1 tablet by mouth 1 day or 1 dose.    . gabapentin (NEURONTIN) 600 MG tablet Take 600 mg by mouth 4 (four) times daily.     . insulin aspart (NOVOLOG) 100 UNIT/ML injection Inject 5-15 Units into the skin 3 (three) times daily before meals. SS 100-150 = 5 units 151-200 = 7 units 201-250 = 9 units 251-300 =11 units 301-350 = 13 units 351-400 = 15 units    . insulin glargine (LANTUS) 100 UNIT/ML injection Inject 55 Units into the skin 2 (two) times daily.     Marland Kitchen ketoconazole (NIZORAL) 2 % cream Apply 1 application topically daily. 60 g 2  . levocetirizine ([name]) 5 MG tablet Take 5 mg by mouth every evening.    Marland Kitchen lisinopril-hydrochlorothiazide (PRINZIDE,ZESTORETIC) 20-25 MG per tablet Take 1 tablet by mouth daily.    . montelukast (SINGULAIR) 10 MG tablet Take 10 mg by mouth daily.    . Naftifine HCl (NAFTIN) 2 % CREA Apply 1 application topically 1 day or 1 dose. 60 g 2  . Omega-3 Fatty Acids (FISH OIL) 1000 MG CAPS Take by mouth 2 (two) times daily.    Marland Kitchen omeprazole (PRILOSEC) 20 MG capsule Take 20 mg by mouth daily.    Marland Kitchen oxyCODONE-acetaminophen (PERCOCET) 10-325 MG per tablet Take 1 tablet by mouth every 6 (six) hours as needed for pain.    . polyethylene glycol (MIRALAX / GLYCOLAX) packet Take 17 g by mouth daily.    Marland Kitchen senna (SENOKOT) 8.6 MG tablet Take 1 tablet by mouth daily.    . silver sulfADIAZINE (SILVADENE) 1 % cream Apply 1 application topically daily. 50 g 0  . simvastatin (ZOCOR) 40 MG tablet Take 40 mg by mouth daily.    Marland Kitchen tiotropium (SPIRIVA) 18 MCG inhalation capsule Place 18 mcg into inhaler and inhale daily.    . traMADol (ULTRAM) 50 MG  tablet Take 2 tablets (100 mg total) by mouth every 6 (six) hours. 100 tablet 1  . vitamin B-12 (CYANOCOBALAMIN) 500 MCG tablet Take 1,000 mcg by mouth daily.    . cyclobenzaprine (FLEXERIL) 10 MG tablet Take 1 tablet (10 mg total) by mouth 3 (three) times daily. (Patient not taking: Reported on 07/20/2014)    . furosemide (LASIX) 40 MG tablet Take 40 mg by mouth daily.      No current facility-administered medications for this visit.    OBJECTIVE: BP 145/86 mmHg  Pulse 70  Temp(Src) 98.5 F (36.9 C) (Tympanic)  Resp 20  Wt 200 lb 6.4 oz (90.9 kg)  SpO2 97%   Body mass index is 31.38 kg/(m^2).    ECOG FS:1 - Symptomatic but completely ambulatory  General: Well-developed, well-nourished, no acute  distress. Eyes: Pink conjunctiva, anicteric sclera. HEENT: Normocephalic, moist mucous membranes, clear oropharnyx. Lungs: Clear to auscultation bilaterally. Heart: Regular rate and rhythm. No rubs, murmurs, or gallops. Abdomen: Soft, nontender, nondistended. No organomegaly noted, normoactive bowel sounds. Musculoskeletal: No edema, cyanosis, or clubbing. Neuro: Alert, answering all questions appropriately. Cranial nerves grossly intact. Skin: No rashes or petechiae noted. Psych: Normal affect.    LAB RESULTS:  Appointment on 01/10/2015  Component Date Value Ref Range Status  . WBC 01/10/2015 7.9  3.8 - 10.6 K/uL Final  . RBC 01/10/2015 5.68  4.40 - 5.90 MIL/uL Final  . Hemoglobin 01/10/2015 18.0  13.0 - 18.0 g/dL Final  . HCT 01/10/2015 54.2* 40.0 - 52.0 % Final  . MCV 01/10/2015 95.4  80.0 - 100.0 fL Final  . MCH 01/10/2015 31.6  26.0 - 34.0 pg Final  . MCHC 01/10/2015 33.1  32.0 - 36.0 g/dL Final  . RDW 01/10/2015 14.0  11.5 - 14.5 % Final  . Platelets 01/10/2015 132* 150 - 440 K/uL Final  . Neutrophils Relative % 01/10/2015 65   Final  . Neutro Abs 01/10/2015 5.1  1.4 - 6.5 K/uL Final  . Lymphocytes Relative 01/10/2015 24   Final  . Lymphs Abs 01/10/2015 1.9  1.0 - 3.6 K/uL  Final  . Monocytes Relative 01/10/2015 6   Final  . Monocytes Absolute 01/10/2015 0.5  0.2 - 1.0 K/uL Final  . Eosinophils Relative 01/10/2015 4   Final  . Eosinophils Absolute 01/10/2015 0.3  0 - 0.7 K/uL Final  . Basophils Relative 01/10/2015 1   Final  . Basophils Absolute 01/10/2015 0.1  0 - 0.1 K/uL Final    STUDIES: No results found.  ASSESSMENT:  Polycythemia vera Mild lymphocytosis  PLAN:   1. Polycythemia vera, secondary. Previously noted PET CT and HIV reviewed and negative, carboxyhemoglobin elevated. This previous JAK 2 mutation analysis has been performed. Phlebotomy was performed today due to symptoms of weakness and headaches. Patient will continue with follow-up in 6 weeks with labs in 12 weeks with lab and provider visit.  Patient expressed understanding and was in agreement with this plan. He also understands that He can call clinic at any time with any questions, concerns, or complaints.   Dr. Oliva Bustard was available for consultation and review of plan of care for this patient.   Evlyn Kanner, NP   01/10/2015 12:19 PM

## 2015-01-19 ENCOUNTER — Other Ambulatory Visit: Payer: Self-pay

## 2015-01-19 MED ORDER — POLYETHYLENE GLYCOL 3350 17 G PO PACK: 17.0000 g | PACK | Freq: Every day | ORAL | Status: DC | PRN

## 2015-02-01 ENCOUNTER — Ambulatory Visit (INDEPENDENT_AMBULATORY_CARE_PROVIDER_SITE_OTHER): Payer: Medicare Other | Admitting: Podiatry

## 2015-02-01 DIAGNOSIS — B351 Tinea unguium: Secondary | ICD-10-CM | POA: Diagnosis not present

## 2015-02-01 DIAGNOSIS — M79676 Pain in unspecified toe(s): Secondary | ICD-10-CM

## 2015-02-01 NOTE — Progress Notes (Signed)
Patient ID: [name], male   DOB: 1945-09-10, 69 y.o.   MRN: [id number]  Subjective: Mr. Kishi returns the office for painful, elongated, thick toenails which he is unable to trim himself. He denies any redness or drainage from around the nail sites. He is scheduled to have an angioplasty of the right lower extremity next week with vascular surgery. No other complaints at this time in no acute changes since last appointment. Denies any systemic complaints as fevers, chills, nausea, vomiting.  Objective: AAO 3, NAD DP/PT pulses palpable, CRT less than 3 seconds; there is a decrease in pulses on the left compared to the right however they are palpable.  Decreased protective sensation with Derrel Nip monofilament, vibratory sensation intact, Achilles tendon reflex intact. Nails hypertrophic, dystrophic, elongated, brittle, discolored without any surrounding erythema or drainage x10. There is tenderness to the nails 1-5 bilaterally.  No open lesions or pre-ulcer lesions at this time. Tinea pedis appears mostly resolved.  No areas of pinpoint bony tenderness or pain with vibratory sensation bilaterally.  There is no pain with calf compression, swelling, warmth, erythema.  Assessment: 69 year old male with symptomatic onychomycosis, tinea pedis   Plan: -Treatment options were discussed with the patient including alternatives, risks, complications. -Nail sharply debrided 10 without complications. -Discussed the importance of daily foot inspection. -Follow-up in 3 months or sooner should any problems arise. In the meantime encouraged call the office they questioned, concerns, change in symptoms.  Celesta Gentile, DPM

## 2015-02-06 ENCOUNTER — Encounter: Payer: Self-pay | Admitting: *Deleted

## 2015-02-06 ENCOUNTER — Encounter
Admission: RE | Disposition: A | Discharge status: Home / Self Care | Payer: Self-pay | Source: Ambulatory Visit | Attending: Vascular Surgery

## 2015-02-06 ENCOUNTER — Ambulatory Visit
Admission: RE | Admit: 2015-02-06 | Discharge: 2015-02-06 | Disposition: A | Discharge status: Home / Self Care | Payer: Medicare Other | Source: Ambulatory Visit | Attending: Vascular Surgery | Admitting: Vascular Surgery

## 2015-02-06 DIAGNOSIS — E669 Obesity, unspecified: Secondary | ICD-10-CM | POA: Diagnosis not present

## 2015-02-06 DIAGNOSIS — Z79899 Other long term (current) drug therapy: Secondary | ICD-10-CM | POA: Diagnosis not present

## 2015-02-06 DIAGNOSIS — E119 Type 2 diabetes mellitus without complications: Secondary | ICD-10-CM | POA: Diagnosis not present

## 2015-02-06 DIAGNOSIS — I70223 Atherosclerosis of native arteries of extremities with rest pain, bilateral legs: Secondary | ICD-10-CM | POA: Diagnosis not present

## 2015-02-06 DIAGNOSIS — I2510 Atherosclerotic heart disease of native coronary artery without angina pectoris: Secondary | ICD-10-CM | POA: Insufficient documentation

## 2015-02-06 DIAGNOSIS — Z794 Long term (current) use of insulin: Secondary | ICD-10-CM | POA: Diagnosis not present

## 2015-02-06 DIAGNOSIS — J449 Chronic obstructive pulmonary disease, unspecified: Secondary | ICD-10-CM | POA: Insufficient documentation

## 2015-02-06 DIAGNOSIS — E785 Hyperlipidemia, unspecified: Secondary | ICD-10-CM | POA: Diagnosis not present

## 2015-02-06 DIAGNOSIS — F17200 Nicotine dependence, unspecified, uncomplicated: Secondary | ICD-10-CM | POA: Diagnosis not present

## 2015-02-06 HISTORY — DX: Peripheral vascular disease, unspecified: I73.9

## 2015-02-06 HISTORY — PX: PERIPHERAL VASCULAR CATHETERIZATION: SHX172C

## 2015-02-06 HISTORY — DX: Chronic obstructive pulmonary disease, unspecified: J44.9

## 2015-02-06 LAB — CREATININE, SERUM
Creatinine, Ser: 1.16 mg/dL (ref 0.61–1.24)
GFR calc non Af Amer: >60 mL/min (ref >60)

## 2015-02-06 LAB — BUN: BUN: 24 mg/dL — ABNORMAL HIGH (ref 6–20)

## 2015-02-06 SURGERY — ABDOMINAL AORTOGRAM W/LOWER EXTREMITY
Wound class: Clean

## 2015-02-06 MED ORDER — CEFAZOLIN SODIUM 1-5 GM-% IV SOLN
INTRAVENOUS | Status: AC
  Filled 2015-02-06: qty 50

## 2015-02-06 MED ORDER — FENTANYL CITRATE (PF) 100 MCG/2ML IJ SOLN
INTRAMUSCULAR | Status: DC | PRN
  Administered 2015-02-06: 50 ug via INTRAVENOUS
  Administered 2015-02-06: 100 ug via INTRAVENOUS

## 2015-02-06 MED ORDER — HEPARIN (PORCINE) IN NACL 2-0.9 UNIT/ML-% IJ SOLN
INTRAMUSCULAR | Status: AC
  Filled 2015-02-06: qty 1000

## 2015-02-06 MED ORDER — LIDOCAINE HCL (PF) 1 % IJ SOLN
INTRAMUSCULAR | Status: AC
  Filled 2015-02-06: qty 10

## 2015-02-06 MED ORDER — HYDROMORPHONE HCL 1 MG/ML IJ SOLN
INTRAMUSCULAR | Status: AC
  Filled 2015-02-06: qty 1

## 2015-02-06 MED ORDER — IOHEXOL 300 MG/ML  SOLN
INTRAMUSCULAR | Status: DC | PRN
  Administered 2015-02-06: 120 mL via INTRA_ARTERIAL

## 2015-02-06 MED ORDER — HEPARIN SODIUM (PORCINE) 1000 UNIT/ML IJ SOLN
INTRAMUSCULAR | Status: AC
  Filled 2015-02-06: qty 1

## 2015-02-06 MED ORDER — LIDOCAINE HCL (PF) 1 % IJ SOLN
INTRAMUSCULAR | Status: DC | PRN
  Administered 2015-02-06 (×2): 10 mL via INTRADERMAL

## 2015-02-06 MED ORDER — SODIUM CHLORIDE 0.9 % IV SOLN
INTRAVENOUS | Status: DC
  Administered 2015-02-06: 08:00:00 via INTRAVENOUS

## 2015-02-06 MED ORDER — FENTANYL CITRATE (PF) 100 MCG/2ML IJ SOLN
INTRAMUSCULAR | Status: AC
  Filled 2015-02-06: qty 2

## 2015-02-06 MED ORDER — HYDROMORPHONE HCL 1 MG/ML IJ SOLN
1.0000 mg | INTRAMUSCULAR | Status: DC | PRN
  Administered 2015-02-06: 1 mg via INTRAVENOUS

## 2015-02-06 MED ORDER — MIDAZOLAM HCL 2 MG/2ML IJ SOLN
INTRAMUSCULAR | Status: DC | PRN
  Administered 2015-02-06: 1 mg via INTRAVENOUS
  Administered 2015-02-06: 2 mg via INTRAVENOUS

## 2015-02-06 MED ORDER — CEFAZOLIN SODIUM 1-5 GM-% IV SOLN
1.0000 g | Freq: Once | INTRAVENOUS | Status: AC
  Administered 2015-02-06: 1 g via INTRAVENOUS

## 2015-02-06 MED ORDER — MIDAZOLAM HCL 5 MG/5ML IJ SOLN
INTRAMUSCULAR | Status: AC
  Filled 2015-02-06: qty 5

## 2015-02-06 SURGICAL SUPPLY — 12 items
BALLN ARMADA 12X20X80 (BALLOONS) ×5
BALLN ARMADA 9X20X80 (BALLOONS) ×5
BALLOON ARMADA 12X20X80 (BALLOONS) ×3 IMPLANT
BALLOON ARMADA 9X20X80 (BALLOONS) ×3 IMPLANT
CATH ROYAL FLUSH PIG 5F 70CM (CATHETERS) ×5 IMPLANT
DEVICE PRESTO INFLATION (MISCELLANEOUS) ×5 IMPLANT
DEVICE STARCLOSE SE CLOSURE (Vascular Products) ×3 IMPLANT
PACK ANGIOGRAPHY (CUSTOM PROCEDURE TRAY) ×5 IMPLANT
SHEATH BRITE TIP 5FRX11 (SHEATH) ×5 IMPLANT
STENT OMNILINK ELITE 8X29X80 (Permanent Stent) ×3 IMPLANT
WIRE J 3MM .035X145CM (WIRE) ×5 IMPLANT
WIRE MAGIC TOR.035 180C (WIRE) ×5 IMPLANT

## 2015-02-06 NOTE — Progress Notes (Signed)
fsbs 155 this am prior to admission

## 2015-02-06 NOTE — H&P (Signed)
[facility] History & Physical Update  The patient was interviewed and re-examined.  The patient's previous History and Physical has been reviewed and is unchanged.  There is no change in the plan of care. We plan to proceed with the scheduled procedure.  Zhanae Proffit, Dolores Lory, MD  02/06/2015, 10:48 AM

## 2015-02-06 NOTE — Op Note (Signed)
[name]  Percutaneous Study/Intervention Procedural Note   Date of Surgery: 02/06/2015  Surgeon:Schnier, Dolores Lory   Pre-operative Diagnosis: Atherosclerotic occlusive disease bilateral lower extremities with rest pain; painful lower extremity; COPD; diabetes mellitus Post-operative diagnosis:  Same  Procedure(s) Performed:  1.  Abdominal aortogram  2.  Right lower extremity distal runoff via right femoral sheath  3.  Left lower extremity distal runoff via left femoral sheath  4.  Percutaneous transluminal angioplasty and stent placement right common iliac artery   Anesthesia: Conscious sedation  Sheath: 6 French sheath retrograde right common femoral artery; 5 [place] micro-sheath retrograde left common femoral artery  Contrast: Isovue 120 cc  Fluoroscopy Time: 4.7 minutes  Indications:  Patient presented to the office with increasing pain in his lower 70s he notes that his right lower extremity is much worse than his left. He is describing pain at night while sleeping which wakes him area adequately. Stationery dependency makes him better walking makes both legs much worse. Physical examination as well as noninvasive studies suggested significant atherosclerotic occlusive disease. Patient voices lifestyle limitations and mild rest pain symptoms and therefore he is undergoing angiography with the hope for intervention. The risks and benefits of been reviewed alternative therapies have been discussed QUESTIONS of been answered and the patient agrees to proceed with angiography and intervention  Procedure:  Colton Carteris a 69 y.o. male who was identified and appropriate procedural time out was performed.  The patient was then placed supine on the table and prepped and draped in the usual sterile fashion.  Ultrasound was used to evaluate the left common femoral artery.  It was noted to be echolucent with mild posterior plaque but minimally pulsatile which  indicated patency.  A digital ultrasound image was recorded for the permanent record.  A micropuncture needle was used to access the left common femoral artery under direct ultrasound guidance and the microwire was advanced. Resistance was met and the wire was then advanced under fluoroscopy. Micro-sheath was then inserted and a small hand injection demonstrated a subintimal plane within the external iliac on the left. Under fluoroscopic guidance the sheath was then pulled back puffing contrast until it was in the proximal common femoral at which point it is intraluminal. 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 The micro-sheath was then capped and flushed with heparinized saline.  Attention was then turned to the right groin which was imaged with ultrasound. The common femoral artery is then verified it is echolucent and pulsatile. Images recorded for the permanent record. Under real-time visualization a microneedle is inserted into the anterior wall the common femoral artery microwire is then advanced under fluoroscopy without any difficulty. Micro-sheath is then placed followed by a J-wire and a 5 [place] sheath.  J-wire pedal catheter then advanced into the abdominal aorta and positioned the level of T12. AP projection of the aorta is then obtained with a bolus injection of contrast.  After reviewing the images the pigtail catheter was repositioned to above the bifurcation and bilateral oblique views of the pelvis are obtained.  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 On the right side there is a 90% stenosis of the proximal one third  of the common iliac artery the distal common  iliac artery is enlarged consistent with post stenotic dilatation. The external and internal iliac arteries on the right are widely patent.  3000 units of heparin was given and a Magic torque wires advanced through the pigtail catheter. Magnified images of the aortic bifurcation and and LAO projection are obtained and a 8 x 29 Omnilink stent is opened onto the field. Sheath was upsized to a 6 French sheath and the Omnilink stent is positioned across the lesion within the common iliac. It is deployed without difficulty. Subsequently the proximal two thirds is dilated to 9 mm and the distal one third is dilated to 12 mm matching the contour of the right common iliac. Follow-up imaging demonstrates that the stent is well positioned in now opposes the wall throughout its entire length and is widely patent. Distal outflow was preserved.  Attention is then turned to bilateral distal runoff which is performed through hand injection from both sheaths first doing the right and then the left. After review these images oblique view of the groin is obtained on the right and a Star close device deployed without difficulty. The micro-sheath on the left is pulled and pressures held. There are no immediate Complications   Findings:   Aortogram:  The abdominal aorta demonstrates diffuse atherosclerotic changes but is widely patent.  Right Lower Extremity:  The right common iliac artery is patent at its origin within the proximal one third there is a 90% diameter reduction or stenosis. Distal to this there is poststenotic dilatation of approximately 12 mm. The right iliac bifurcation is widely patent and the external and internal iliac arteries are widely patent. The right common femoral profunda femoris superficial femoral arteries are widely patent. Popliteal and trifurcation widely patent and there is three-vessel runoff to the foot with the anterior tibial and posterior tibial being equal in size and patent filling the  pedal arch.  Following and plasty and stent placement there is now complete resolution of the lesion with good wall apposition of the stent throughout its entire length.   Left Lower Extremity:  The left common iliac is patent although it is somewhat small in diameter there are no focal narrowings or stenoses identified. The iliac bifurcation demonstrated patency of the internal iliac occlusion, that is flush occlusion of the external iliac on the left. There is reconstitution of the common femoral at the level of the ilioinguinal ligament from pelvic collaterals. There is a 40-50% smooth narrowing of the left common femoral. The left profunda femoris and superficial femoral arteries are widely patent throughout their course as is the popliteal. Trifurcation is widely patent and as seen on the right side there is three-vessel runoff to the foot with the anterior tibial and posterior tibial being roughly equal in size on the left the posterior tibial may be slightly larger both filled the pedal arch and are free of hemodynamically significant lesions throughout their entire course.  Summary: 90% diameter reduction of the right common iliac successfully treated with per cutaneous transluminal and plasty and stent placement.  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  Schnier, Dolores Lory 02/06/2015,10:49 AM

## 2015-02-07 ENCOUNTER — Encounter: Payer: Self-pay | Admitting: Vascular Surgery

## 2015-02-21 ENCOUNTER — Other Ambulatory Visit: Payer: Self-pay | Admitting: Family Medicine

## 2015-02-21 ENCOUNTER — Inpatient Hospital Stay: Payer: Medicare Other

## 2015-02-21 ENCOUNTER — Inpatient Hospital Stay: Payer: Medicare Other | Attending: Family Medicine

## 2015-02-21 DIAGNOSIS — D751 Secondary polycythemia: Secondary | ICD-10-CM | POA: Diagnosis present

## 2015-02-21 LAB — HEMATOCRIT: HEMATOCRIT: 52 % (ref 40.0–52.0)

## 2015-02-21 NOTE — Progress Notes (Signed)
Patient states he is doing well. Per Georgeanne Nim PA, no treatment today.

## 2015-03-09 ENCOUNTER — Other Ambulatory Visit: Payer: Self-pay | Admitting: Vascular Surgery

## 2015-03-09 DIAGNOSIS — I70219 Atherosclerosis of native arteries of extremities with intermittent claudication, unspecified extremity: Secondary | ICD-10-CM

## 2015-03-14 ENCOUNTER — Other Ambulatory Visit: Payer: Self-pay | Admitting: Vascular Surgery

## 2015-03-20 ENCOUNTER — Encounter
Admission: RE | Disposition: A | Discharge status: Home / Self Care | Payer: Self-pay | Source: Ambulatory Visit | Attending: Vascular Surgery

## 2015-03-20 ENCOUNTER — Ambulatory Visit
Admission: RE | Admit: 2015-03-20 | Discharge: 2015-03-20 | Disposition: A | Discharge status: Home / Self Care | Payer: Medicare Other | Source: Ambulatory Visit | Attending: Vascular Surgery | Admitting: Vascular Surgery

## 2015-03-20 DIAGNOSIS — I70223 Atherosclerosis of native arteries of extremities with rest pain, bilateral legs: Secondary | ICD-10-CM | POA: Insufficient documentation

## 2015-03-20 DIAGNOSIS — Z79899 Other long term (current) drug therapy: Secondary | ICD-10-CM | POA: Diagnosis not present

## 2015-03-20 DIAGNOSIS — E669 Obesity, unspecified: Secondary | ICD-10-CM | POA: Insufficient documentation

## 2015-03-20 DIAGNOSIS — I2510 Atherosclerotic heart disease of native coronary artery without angina pectoris: Secondary | ICD-10-CM | POA: Insufficient documentation

## 2015-03-20 DIAGNOSIS — Z7982 Long term (current) use of aspirin: Secondary | ICD-10-CM | POA: Diagnosis not present

## 2015-03-20 DIAGNOSIS — I10 Essential (primary) hypertension: Secondary | ICD-10-CM | POA: Insufficient documentation

## 2015-03-20 DIAGNOSIS — F17200 Nicotine dependence, unspecified, uncomplicated: Secondary | ICD-10-CM | POA: Insufficient documentation

## 2015-03-20 DIAGNOSIS — E780 Pure hypercholesterolemia: Secondary | ICD-10-CM | POA: Insufficient documentation

## 2015-03-20 DIAGNOSIS — E785 Hyperlipidemia, unspecified: Secondary | ICD-10-CM | POA: Diagnosis not present

## 2015-03-20 DIAGNOSIS — E119 Type 2 diabetes mellitus without complications: Secondary | ICD-10-CM | POA: Insufficient documentation

## 2015-03-20 DIAGNOSIS — J449 Chronic obstructive pulmonary disease, unspecified: Secondary | ICD-10-CM | POA: Insufficient documentation

## 2015-03-20 DIAGNOSIS — I890 Lymphedema, not elsewhere classified: Secondary | ICD-10-CM | POA: Diagnosis not present

## 2015-03-20 DIAGNOSIS — I872 Venous insufficiency (chronic) (peripheral): Secondary | ICD-10-CM | POA: Diagnosis not present

## 2015-03-20 HISTORY — PX: PERIPHERAL VASCULAR CATHETERIZATION: SHX172C

## 2015-03-20 LAB — BASIC METABOLIC PANEL
Anion gap: 7 (ref 5–15)
BUN: 26 mg/dL — AB (ref 6–20)
CALCIUM: 8.9 mg/dL (ref 8.9–10.3)
CO2: 29 mmol/L (ref 22–32)
CREATININE: 1.3 mg/dL — AB (ref 0.61–1.24)
Chloride: 108 mmol/L (ref 101–111)
GFR calc Af Amer: >60 mL/min (ref >60)
GFR, EST NON AFRICAN AMERICAN: 55 mL/min — AB (ref >60)
GLUCOSE: 109 mg/dL — AB (ref 65–99)
POTASSIUM: 3.6 mmol/L (ref 3.5–5.1)
SODIUM: 144 mmol/L (ref 135–145)

## 2015-03-20 SURGERY — LOWER EXTREMITY ANGIOGRAPHY
Anesthesia: Moderate Sedation | Laterality: Left

## 2015-03-20 MED ORDER — FENTANYL CITRATE (PF) 100 MCG/2ML IJ SOLN
INTRAMUSCULAR | Status: AC
  Filled 2015-03-20: qty 2

## 2015-03-20 MED ORDER — DEXTROSE 5 % IV SOLN
1.5000 g | INTRAVENOUS | Status: AC
  Administered 2015-03-20: 1.5 g via INTRAVENOUS
  Filled 2015-03-20: qty 1.5

## 2015-03-20 MED ORDER — OXYCODONE HCL 5 MG PO TABS: 5.0000 mg | ORAL_TABLET | ORAL | Status: DC | PRN

## 2015-03-20 MED ORDER — INSULIN ASPART 100 UNIT/ML ~~LOC~~ SOLN: 0.0000 [IU] | SUBCUTANEOUS | Status: DC

## 2015-03-20 MED ORDER — ACETAMINOPHEN 325 MG RE SUPP: 325.0000 mg | RECTAL | Status: DC | PRN

## 2015-03-20 MED ORDER — HYDROMORPHONE HCL 1 MG/ML IJ SOLN
INTRAMUSCULAR | Status: AC
  Administered 2015-03-20: 0.5 mg via INTRAVENOUS
  Filled 2015-03-20: qty 1

## 2015-03-20 MED ORDER — FENTANYL CITRATE (PF) 100 MCG/2ML IJ SOLN
INTRAMUSCULAR | Status: DC | PRN
  Administered 2015-03-20 (×2): 50 ug via INTRAVENOUS

## 2015-03-20 MED ORDER — CEFAZOLIN SODIUM 1-5 GM-% IV SOLN
INTRAVENOUS | Status: AC
  Filled 2015-03-20: qty 50

## 2015-03-20 MED ORDER — LIDOCAINE HCL (PF) 1 % IJ SOLN
INTRAMUSCULAR | Status: DC | PRN
  Administered 2015-03-20: 5 mL via INTRADERMAL

## 2015-03-20 MED ORDER — HYDROMORPHONE HCL 1 MG/ML IJ SOLN
0.5000 mg | INTRAMUSCULAR | Status: DC | PRN
  Administered 2015-03-20 (×2): 0.5 mg via INTRAVENOUS

## 2015-03-20 MED ORDER — MIDAZOLAM HCL 5 MG/5ML IJ SOLN
INTRAMUSCULAR | Status: AC
  Filled 2015-03-20: qty 5

## 2015-03-20 MED ORDER — SODIUM CHLORIDE 0.9 % IV SOLN
INTRAVENOUS | Status: DC
  Administered 2015-03-20: 08:00:00 via INTRAVENOUS

## 2015-03-20 MED ORDER — IOHEXOL 300 MG/ML  SOLN
INTRAMUSCULAR | Status: DC | PRN
  Administered 2015-03-20: 70 mL via INTRA_ARTERIAL

## 2015-03-20 MED ORDER — CHLORHEXIDINE GLUCONATE CLOTH 2 % EX PADS: 6.0000 | MEDICATED_PAD | Freq: Once | CUTANEOUS | Status: DC

## 2015-03-20 MED ORDER — HEPARIN SODIUM (PORCINE) 1000 UNIT/ML IJ SOLN
INTRAMUSCULAR | Status: AC
  Filled 2015-03-20: qty 1

## 2015-03-20 MED ORDER — HEPARIN (PORCINE) IN NACL 2-0.9 UNIT/ML-% IJ SOLN
INTRAMUSCULAR | Status: AC
  Filled 2015-03-20: qty 1000

## 2015-03-20 MED ORDER — LIDOCAINE HCL (PF) 1 % IJ SOLN
INTRAMUSCULAR | Status: AC
Start: 2015-03-20 — End: 2015-03-20
  Filled 2015-03-20: qty 10

## 2015-03-20 MED ORDER — ACETAMINOPHEN 325 MG PO TABS: 325.0000 mg | ORAL_TABLET | ORAL | Status: DC | PRN

## 2015-03-20 MED ORDER — ONDANSETRON HCL 4 MG/2ML IJ SOLN: 4.0000 mg | Freq: Four times a day (QID) | INTRAMUSCULAR | Status: DC | PRN

## 2015-03-20 MED ORDER — HEPARIN SODIUM (PORCINE) 1000 UNIT/ML IJ SOLN
INTRAMUSCULAR | Status: DC | PRN
  Administered 2015-03-20: 4000 [IU] via INTRAVENOUS

## 2015-03-20 MED ORDER — SODIUM CHLORIDE 0.9 % IJ SOLN
INTRAMUSCULAR | Status: AC
  Filled 2015-03-20: qty 15

## 2015-03-20 MED ORDER — MIDAZOLAM HCL 2 MG/2ML IJ SOLN
INTRAMUSCULAR | Status: DC | PRN
  Administered 2015-03-20: 1 mg via INTRAVENOUS
  Administered 2015-03-20: 2 mg via INTRAVENOUS

## 2015-03-20 SURGICAL SUPPLY — 22 items
BALLN LUTONIX DCB 5X60X130 (BALLOONS) ×8
BALLN ULTRVRSE 3X100X75C (BALLOONS) ×4
BALLN ULTRVRSE 6X60X75C (BALLOONS) ×4
BALLN ULTRVRSE 7X40X75C (BALLOONS) ×4
BALLOON LUTONIX DCB 5X60X130 (BALLOONS) ×4 IMPLANT
BALLOON ULTRVRSE 3X100X75C (BALLOONS) ×2 IMPLANT
BALLOON ULTRVRSE 6X60X75C (BALLOONS) ×2 IMPLANT
BALLOON ULTRVRSE 7X40X75C (BALLOONS) ×2 IMPLANT
CATH PIG 70CM (CATHETERS) ×4 IMPLANT
DEVICE PRESTO INFLATION (MISCELLANEOUS) ×4 IMPLANT
DEVICE STARCLOSE SE CLOSURE (Vascular Products) ×2 IMPLANT
GLIDEWIRE ANGLED SS 035X260CM (WIRE) ×4 IMPLANT
LIFESTENT 7X100X130 (Permanent Stent) ×2 IMPLANT
LIFESTENT 7X60X130 (Permanent Stent) ×2 IMPLANT
PACK ANGIOGRAPHY (CUSTOM PROCEDURE TRAY) ×4 IMPLANT
SET INTRO CAPELLA COAXIAL (SET/KITS/TRAYS/PACK) ×4 IMPLANT
SHEATH BALKIN 6FR (SHEATH) ×4 IMPLANT
SHEATH BRITE TIP 5FRX11 (SHEATH) ×4 IMPLANT
SYR MEDRAD MARK V 150ML (SYRINGE) ×4 IMPLANT
TUBING CONTRAST HIGH PRESS 72 (TUBING) ×4 IMPLANT
WIRE J 3MM .035X145CM (WIRE) ×4 IMPLANT
WIRE MAGIC TORQUE 260C (WIRE) ×4 IMPLANT

## 2015-03-20 NOTE — H&P (Signed)
[facility] History & Physical Update  The patient was interviewed and re-examined.  The patient's previous History and Physical has been reviewed and is unchanged.  There is no change in the plan of care. We plan to proceed with the scheduled procedure.  Schnier, Dolores Lory, MD  03/20/2015, 9:34 AM

## 2015-03-20 NOTE — Discharge Instructions (Signed)
The drugs you were given will stay in your system until tomorrow, so for the next 24 hours you should not.  Drive an automobile. Make any legal decisions.  Drink any alcoholic beverages.  Today you should start with liquids and gradually work up to solid foods as your are able to tolerate them  Resume your regular medications as prescribed by your doctor.    Change the Band-Aid or dressing as needed.  After 1 day no dressing isneeded.  Avoid strenuous activity for the remainder of the day.  Please notify your primary physician immediately if you have any unusual bleeding, trouble breathing, fever >100 degrees or pain not relieved by the medication your doctor prescribed for your doctor prescribed for you physician  Notify the doctor for signs of bleeding and infection such as; bleeding, swelling, redness, pain that does not decrease after taking tylenol or ibuprofen, cloudy or yellow drainage and fevers  You may take tylenol or ibuprofen for discomfort at the procedure site.

## 2015-03-20 NOTE — Op Note (Signed)
[facility]  Percutaneous Study/Intervention Procedural Note   Date of Surgery: 03/20/2015  Surgeon:Schnier, Dolores Lory   Pre-operative Diagnosis: Atherosclerotic occlusive disease bilateral lower extremities with rest pain left lower extremity Post-operative diagnosis:  Same  Procedure(s) Performed:  1.  Abdominal aortogram  2.  Left lower extremity distal runoff third order catheter placement  3.  Percutaneous transluminal angioplasty and stent placement left external iliac artery  4.  Percutaneous transluminal and plasty and stent placement left common iliac artery   Anesthesia: Conscious sedation with Versed and fentanyl  Sheath: 6 French right common femoral artery  Contrast: 70 cc  Fluoroscopy Time: 11.6 minutes  Indications:  Patient presented to the office with increasing pain in his left lower extremity including at rest. Noninvasive studies demonstrated marked deterioration in his ABIs. Risks and benefits for angiography intervention were reviewed all questions answered patient has agreed to proceed  Procedure:  Colton Carteris a 69 y.o. male who was identified and appropriate procedural time out was performed.  The patient was then placed supine on the table and prepped and draped in the usual sterile fashion.  Ultrasound was used to evaluate the right common femoral artery.  It was patent .  A digital ultrasound image was acquired.  A micropuncture needle was used to access the right common femoral artery under direct ultrasound guidance and a permanent image was performed.  A microwire followed by a micro-sheath was then inserted.  A 0.035 J wire was advanced without resistance and a 5Fr sheath was placed.  Pigtail catheter was then positioned the level of T12 and AP projection of the aorta was obtained. Pigtail catheter was then repositioned to above the bifurcation and an RAO projection of the pelvis was obtained. The detector was then repositioned to  AP and femoral imaging was obtained.  After review these images 4000 units of heparin was given and the pigtail catheter and a stiff angle Glidewire were used to cross the aortic bifurcation and subsequently a 6 [place] sheath was positioned with its tip in the distal left common iliac artery.  Using accommodation of a Glidewire and a Kumpe catheter the occlusion of the left external iliac artery was negotiated catheter was then advanced into the profunda femoris artery where hand injection contrast demonstrated intraluminal positioning. This represented third order catheter placement.  The Kumpe catheter was then renegotiated into the SFA and a Magic torque wire was advanced. Catheter was removed and initially a 3 x 10 balloon was used to predilate the external iliac on the left. Inflation was to 10 atm for 45 seconds. Next a 5 x 6 Lutonix balloon was utilized to angioplasty the external iliac 2 separate balloons were required with inflations to 10 atm for approximately 1-1/2 minutes. Follow-up imaging now demonstrated persistent narrowing throughout the external iliac with obvious dissection at the origin. On magnified imaging distal common iliac lesion of his proximal 65% was noted approximately 1 cm proximal to the bifurcation of the iliac arteries. There is a string sign noted in the origin of the internal iliac on the left.  Given these findings after inflation of the Lutonix balloon a 7 x 100 Life stent was deployed opening the stent distally at the level of the circumflex vessels just at the top of the femoral head and subsequently a additional 7 x 40 Life stent was placed in the left common iliac artery distally extending to the origin of the left external iliac. The life stent  within the external iliac was balloon angioplastied with a 6 x 6 balloon to fully expand the stent and the life stent placed within the common iliac is ballooned with a 7 x 4.  Follow-up imaging demonstrated the iliac  system is now widely patent and there is patency of the common femoral profundus superficial femoral and trifurcation.  Oblique view with the right groin is obtained and a Star close device deployed there are no immediate complications.  Findings:   Aortogram:  Widely patent without evidence of hemodynamically significant stenosis  Right Lower Extremity:  Common iliac and external iliac arteries are widely patent as is the common femoral and origins of the profunda femoris and superficial femoral arteries.  Left Lower Extremity:  The left common iliac artery demonstrates a 60% focal stenosis distally approximately 1 cm proximal to the iliac bifurcation. There is a string sign noted at the origin of the internal iliac artery on the left. The external iliac artery is occluded throughout its course down to the level of the circumflex vessels and the common femoral origin. The common femoral profunda femoris and superficial femoral arteries are patent without evidence of hemodynamically significant disease and there is patency of the trifurcation anterior tibial appears to be the dominant runoff to the foot.  Following crossing of the external iliac artery occlusion and angioplasty there is persistence of the narrowing as well as dissection more proximally and therefore a 7 x 100 life stent is placed this is postdilated to 6 mm. Within the distal common iliac artery there is is a focal stenosis as noted above and a 7 x 40 life stent is placed and postdilated to 7 mm. Follow-up imaging after intervention demonstrates patency with preservation runoff.    Disposition: Patient was taken to the recovery room in stable condition having tolerated the procedure well.  Schnier, Dolores Lory 03/20/2015,9:35 AM

## 2015-03-21 ENCOUNTER — Encounter: Payer: Self-pay | Admitting: Vascular Surgery

## 2015-04-04 ENCOUNTER — Encounter: Payer: Self-pay | Admitting: Family Medicine

## 2015-04-04 ENCOUNTER — Inpatient Hospital Stay: Payer: Medicare Other | Attending: Family Medicine

## 2015-04-04 ENCOUNTER — Inpatient Hospital Stay: Payer: Medicare Other

## 2015-04-04 ENCOUNTER — Inpatient Hospital Stay (HOSPITAL_BASED_OUTPATIENT_CLINIC_OR_DEPARTMENT_OTHER): Payer: Medicare Other | Admitting: Family Medicine

## 2015-04-04 VITALS — BP 148/82 | HR 84 | Temp 98.0°F | Resp 20 | Ht 67.0 in | Wt 202.2 lb

## 2015-04-04 DIAGNOSIS — G629 Polyneuropathy, unspecified: Secondary | ICD-10-CM

## 2015-04-04 DIAGNOSIS — F17210 Nicotine dependence, cigarettes, uncomplicated: Secondary | ICD-10-CM | POA: Insufficient documentation

## 2015-04-04 DIAGNOSIS — R5383 Other fatigue: Secondary | ICD-10-CM | POA: Insufficient documentation

## 2015-04-04 DIAGNOSIS — E780 Pure hypercholesterolemia: Secondary | ICD-10-CM | POA: Diagnosis not present

## 2015-04-04 DIAGNOSIS — J449 Chronic obstructive pulmonary disease, unspecified: Secondary | ICD-10-CM | POA: Diagnosis not present

## 2015-04-04 DIAGNOSIS — Z79899 Other long term (current) drug therapy: Secondary | ICD-10-CM | POA: Insufficient documentation

## 2015-04-04 DIAGNOSIS — Z794 Long term (current) use of insulin: Secondary | ICD-10-CM | POA: Insufficient documentation

## 2015-04-04 DIAGNOSIS — M542 Cervicalgia: Secondary | ICD-10-CM | POA: Insufficient documentation

## 2015-04-04 DIAGNOSIS — D751 Secondary polycythemia: Secondary | ICD-10-CM | POA: Diagnosis not present

## 2015-04-04 DIAGNOSIS — I2510 Atherosclerotic heart disease of native coronary artery without angina pectoris: Secondary | ICD-10-CM | POA: Insufficient documentation

## 2015-04-04 DIAGNOSIS — E119 Type 2 diabetes mellitus without complications: Secondary | ICD-10-CM

## 2015-04-04 DIAGNOSIS — Z7982 Long term (current) use of aspirin: Secondary | ICD-10-CM | POA: Insufficient documentation

## 2015-04-04 DIAGNOSIS — G8929 Other chronic pain: Secondary | ICD-10-CM | POA: Insufficient documentation

## 2015-04-04 DIAGNOSIS — I10 Essential (primary) hypertension: Secondary | ICD-10-CM | POA: Diagnosis not present

## 2015-04-04 DIAGNOSIS — R51 Headache: Secondary | ICD-10-CM | POA: Diagnosis not present

## 2015-04-04 DIAGNOSIS — I739 Peripheral vascular disease, unspecified: Secondary | ICD-10-CM | POA: Diagnosis not present

## 2015-04-04 LAB — HEMATOCRIT: HCT: 52.4 % — ABNORMAL HIGH (ref 40.0–52.0)

## 2015-04-04 NOTE — Progress Notes (Signed)
[facility]  Telephone:([phone]  Fax:([phone]) [place]. DOB: Nov 18, 1945  MR#: [id number]  TKW#:[id number]  Patient Care Team: Casilda Carls, MD as PCP - General (Internal Medicine)  CHIEF COMPLAINT:  Chief Complaint  Patient presents with  . Polycythemia  . Fatigue    INTERVAL HISTORY:  Patient is here for further follow-up and evaluation regarding polycythemia. Patient denies any acute complaints today other than headache lasting for the last several days which he has had in the past when his hematocrit was high and he required phlebotomy. 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 He underwent a stent placement in early September for PVD with LLE angiography. Reports pain has improved with stent placements. Other than the reports headaches she denies any other complaints and is feeling well.  REVIEW OF SYSTEMS:   Review of Systems  Constitutional: Negative for fever, chills, weight loss, malaise/fatigue and diaphoresis.  HENT: Negative for congestion, ear discharge, ear pain, hearing loss, nosebleeds, sore throat and tinnitus.        X several days  Eyes: Negative for blurred vision, double vision, photophobia, pain, discharge and redness.  Respiratory: Negative for cough, hemoptysis, sputum production, shortness of breath, wheezing and stridor.   Cardiovascular: Negative for chest pain, palpitations, orthopnea, claudication, leg swelling and PND.  Gastrointestinal: Negative for heartburn, nausea, vomiting, abdominal pain, diarrhea, constipation, blood in stool and melena.  Genitourinary: Negative.   Musculoskeletal: Negative.   Skin: Negative.   Neurological: Positive for headaches. Negative for dizziness, tingling, focal weakness, seizures and weakness.  Endo/Heme/Allergies: Does not bruise/bleed easily.    Psychiatric/Behavioral: Negative for depression. The patient is not nervous/anxious and does not have insomnia.     As per HPI. Otherwise, a complete review of systems is negatve.   PAST MEDICAL HISTORY: Past Medical History  Diagnosis Date  . Coronary artery disease   . Diabetes mellitus   . Hypertension   . Arthritis   . High cholesterol   . Renal disorder   . Chronic neck pain   . Polycythemia, secondary 01/10/2015  . COPD (chronic obstructive pulmonary disease)   . Peripheral vascular disease     PAST SURGICAL HISTORY: Past Surgical History  Procedure Laterality Date  . Cervical fusion    . Appendectomy    . Cholecystectomy    . Peripheral vascular catheterization N/A 02/06/2015    Procedure: Abdominal Aortogram w/Lower Extremity;  Surgeon: Katha Cabal, MD;  Location: [facility];  Service: Cardiovascular;  Laterality: N/A;  . Peripheral vascular catheterization  02/06/2015    Procedure: Lower Extremity Intervention;  Surgeon: Katha Cabal, MD;  Location: [facility];  Service: Cardiovascular;;  . Peripheral vascular catheterization Left 03/20/2015    Procedure: Lower Extremity Angiography;  Surgeon: Katha Cabal, MD;  Location: [facility];  Service: Cardiovascular;  Laterality: Left;  . Peripheral vascular catheterization  03/20/2015    Procedure: Lower Extremity Intervention;  Surgeon: Katha Cabal, MD;  Location: [facility];  Service: Cardiovascular;;    FAMILY HISTORY History reviewed. No pertinent family history.  GYNECOLOGIC HISTORY:  No LMP for male patient.     ADVANCED DIRECTIVES:    HEALTH MAINTENANCE: Social History  Substance Use Topics  . Smoking status: Current Every Day Smoker -- 0.25 packs/day for 50 years    Types:  Cigarettes  . Smokeless tobacco: Never Used  . Alcohol Use: No     Colonoscopy:  PAP:  Bone density:  Lipid panel:  Allergies  Allergen Reactions  . Levaquin  [Levofloxacin In D5w] Other (See Comments)    Kidney shut down  . Milk-Related Compounds Nausea And Vomiting  . Septra Ds [Sulfamethoxazole-Trimethoprim]     Current Outpatient Prescriptions  Medication Sig Dispense Refill  . ADVAIR DISKUS 250-50 MCG/DOSE AEPB     . ALPRAZolam (XANAX) 0.25 MG tablet Take 0.25-0.5 mg by mouth 3 (three) times daily. Takes 2 tabs in am, 1 at lunch and 1 at bedtime    . aspirin 81 MG tablet Take 81 mg by mouth daily.    Marland Kitchen atenolol (TENORMIN) 25 MG tablet Take 1 tablet by mouth daily.    . baclofen (LIORESAL) 10 MG tablet Take 10 mg by mouth 2 (two) times daily.    . Canagliflozin (INVOKANA) 300 MG TABS Take by mouth daily.    . Cholecalciferol (VITAMIN D-3) 5000 UNITS TABS Take by mouth 2 (two) times daily.    Marland Kitchen docusate sodium 100 MG CAPS Take 200 mg by mouth 2 (two) times daily.    Scarlette Shorts SURECLICK 50 MG/ML injection     . furosemide (LASIX) 40 MG tablet Take 40 mg by mouth daily.     . insulin aspart (NOVOLOG) 100 UNIT/ML injection Inject into the skin 3 (three) times daily before meals. Sliding scale three times a day at mealtime    . insulin glargine (LANTUS) 100 UNIT/ML injection Inject 60 Units into the skin at bedtime.    . insulin glargine (LANTUS) 100 UNIT/ML injection Inject 60 Units into the skin once. BEFORE BREAKFAST    . ketoconazole (NIZORAL) 2 % cream Apply 1 application topically daily. 60 g 2  . levocetirizine ([name]) 5 MG tablet Take 1 tablet by mouth daily.    . montelukast (SINGULAIR) 10 MG tablet Take 1 tablet by mouth daily.    . Naftifine HCl (NAFTIN) 2 % CREA Apply 1 application topically 1 day or 1 dose. 60 g 2  . NOVOLOG FLEXPEN 100 UNIT/ML FlexPen     . Omega-3 Fatty Acids (FISH OIL) 1000 MG CAPS Take by mouth 2 (two) times daily.    Marland Kitchen omeprazole (PRILOSEC) 20 MG capsule Take 2 capsules by mouth daily.    Marland Kitchen oxyCODONE-acetaminophen (PERCOCET) 10-325 MG per tablet Take 1 tablet by mouth every 6 (six) hours as needed for pain.      . polyethylene glycol (MIRALAX / GLYCOLAX) packet Take 17 g by mouth daily as needed. 14 each 8  . predniSONE (DELTASONE) 5 MG tablet Take 1 tablet by mouth daily.    Marland Kitchen senna (SENOKOT) 8.6 MG tablet Take 1 tablet by mouth daily.    . simvastatin (ZOCOR) 40 MG tablet Take 1 tablet by mouth daily.    Marland Kitchen ULORIC 80 MG TABS Take 1 tablet by mouth every other day.    . VENTOLIN HFA 108 (90 BASE) MCG/ACT inhaler Inhale 2 puffs into the lungs 2 (two) times daily.    . vitamin B-12 (CYANOCOBALAMIN) 500 MCG tablet Take 1,000 mcg by mouth daily.    . cyclobenzaprine (FLEXERIL) 10 MG tablet Take 1 tablet (10 mg total) by mouth 3 (three) times daily. (Patient not taking: Reported on 04/04/2015)    . furosemide (LASIX) 40 MG tablet Take 1 tablet by mouth 1 day or 1 dose.    . gabapentin (NEURONTIN) 600 MG tablet  Take 1 tablet by mouth 4 (four) times daily.    . silver sulfADIAZINE (SILVADENE) 1 % cream Apply 1 application topically daily. (Patient not taking: Reported on 04/04/2015) 50 g 0  . traMADol (ULTRAM) 50 MG tablet Take 2 tablets (100 mg total) by mouth every 6 (six) hours. (Patient not taking: Reported on 04/04/2015) 100 tablet 1   No current facility-administered medications for this visit.    OBJECTIVE: BP 148/82 mmHg  Pulse 84  Temp(Src) 98 F (36.7 C) (Tympanic)  Resp 20  Ht 5\' 7"  (1.702 m)  Wt 202 lb 2.6 oz (91.7 kg)  BMI 31.66 kg/m2   Body mass index is 31.66 kg/(m^2).    ECOG FS:1 - Symptomatic but completely ambulatory  General: Well-developed, well-nourished, no acute distress. Eyes: Pink conjunctiva, anicteric sclera. HEENT: Normocephalic, moist mucous membranes, clear oropharnyx. Lungs: Clear to auscultation bilaterally. Heart: Regular rate and rhythm. No rubs, murmurs, or gallops. Abdomen: Soft, nontender, nondistended. No organomegaly noted, normoactive bowel sounds. Musculoskeletal: No edema, cyanosis, or clubbing. Neuro: Alert, answering all questions appropriately. Cranial  nerves grossly intact. Skin: No rashes or petechiae noted. Psych: Normal affect.    LAB RESULTS:  Appointment on 04/04/2015  Component Date Value Ref Range Status  . HCT 04/04/2015 52.4* 40.0 - 52.0 % Final    STUDIES: No results found.  ASSESSMENT:  Polycythemia vera Mild lymphocytosis  PLAN:   1. Polycythemia vera, secondary. Previously noted PET CT and HIV reviewed and negative, carboxyhemoglobin elevated. This previous JAK 2 mutation analysis has been performed. Hct today was 52.4. Phlebotomy of 29ml will be performed today. Patient will continue with follow-up in 6 weeks and with labs in 12 weeks as well as provider visit.  Patient expressed understanding and was in agreement with this plan. He also understands that He can call clinic at any time with any questions, concerns, or complaints.   Dr. Oliva Bustard was available for consultation and review of plan of care for this patient.   Evlyn Kanner, NP   04/04/2015 2:29 PM

## 2015-05-08 ENCOUNTER — Encounter: Payer: Self-pay | Admitting: Sports Medicine

## 2015-05-08 ENCOUNTER — Ambulatory Visit (INDEPENDENT_AMBULATORY_CARE_PROVIDER_SITE_OTHER): Payer: Medicare Other | Admitting: Sports Medicine

## 2015-05-08 ENCOUNTER — Ambulatory Visit: Payer: Medicare Other

## 2015-05-08 DIAGNOSIS — B351 Tinea unguium: Secondary | ICD-10-CM

## 2015-05-08 DIAGNOSIS — E1142 Type 2 diabetes mellitus with diabetic polyneuropathy: Secondary | ICD-10-CM

## 2015-05-08 DIAGNOSIS — M79676 Pain in unspecified toe(s): Secondary | ICD-10-CM | POA: Diagnosis not present

## 2015-05-08 DIAGNOSIS — B353 Tinea pedis: Secondary | ICD-10-CM

## 2015-05-08 MED ORDER — KETOCONAZOLE 2 % EX CREA: 1.0000 "application " | TOPICAL_CREAM | Freq: Every day | CUTANEOUS | Status: DC

## 2015-05-08 NOTE — Progress Notes (Signed)
Patient ID: Colton Carter., male   DOB: Apr 16, 1946, 69 y.o.   MRN: [id number] Subjective: Colton Carter. is a 69 y.o. male patient with history of type 2 diabetes who presents to office today complaining of long, painful nails  while ambulating in shoes; unable to trim. Patient states that the glucose reading this morning was 130 mg/dl. Patient admits that since last visit had stent placed in right leg. Patient denies any new changes in medication or new problems. Patient denies any new cramping, numbness, burning or tingling in the legs.  Patient Active Problem List   Diagnosis Date Noted  . Polycythemia, secondary 01/10/2015  . Low testosterone 04/15/2012  . Rheumatoid arthritis(714.0) 04/15/2012  . GERD (gastroesophageal reflux disease) 04/15/2012  . COPD (chronic obstructive pulmonary disease) ([place]) 04/15/2012  . Chronic renal failure 04/14/2012  . Multiple fractures of ribs of right side 04/13/2012  . Fall from ladder 04/13/2012  . Abrasion of left knee 04/13/2012  . DM (diabetes mellitus) ([place]) 04/13/2012  . Acute blood loss anemia 04/13/2012  . High cholesterol   . Chronic neck pain    Current Outpatient Prescriptions on File Prior to Visit  Medication Sig Dispense Refill  . ADVAIR DISKUS 250-50 MCG/DOSE AEPB     . ALPRAZolam (XANAX) 0.25 MG tablet Take 0.25-0.5 mg by mouth 3 (three) times daily. Takes 2 tabs in am, 1 at lunch and 1 at bedtime    . aspirin 81 MG tablet Take 81 mg by mouth daily.    Marland Kitchen atenolol (TENORMIN) 25 MG tablet Take 1 tablet by mouth daily.    . baclofen (LIORESAL) 10 MG tablet Take 10 mg by mouth 2 (two) times daily.    . Canagliflozin (INVOKANA) 300 MG TABS Take by mouth daily.    . Cholecalciferol (VITAMIN D-3) 5000 UNITS TABS Take by mouth 2 (two) times daily.    . cyclobenzaprine (FLEXERIL) 10 MG tablet Take 1 tablet (10 mg total) by mouth 3 (three) times daily. (Patient not taking: Reported on 04/04/2015)    . docusate sodium 100 MG CAPS Take 200 mg by  mouth 2 (two) times daily.    Scarlette Shorts SURECLICK 50 MG/ML injection     . furosemide (LASIX) 40 MG tablet Take 40 mg by mouth daily.     . furosemide (LASIX) 40 MG tablet Take 1 tablet by mouth 1 day or 1 dose.    . gabapentin (NEURONTIN) 600 MG tablet Take 1 tablet by mouth 4 (four) times daily.    . insulin aspart (NOVOLOG) 100 UNIT/ML injection Inject into the skin 3 (three) times daily before meals. Sliding scale three times a day at mealtime    . insulin glargine (LANTUS) 100 UNIT/ML injection Inject 60 Units into the skin at bedtime.    . insulin glargine (LANTUS) 100 UNIT/ML injection Inject 60 Units into the skin once. BEFORE BREAKFAST    . levocetirizine ([name]) 5 MG tablet Take 1 tablet by mouth daily.    . montelukast (SINGULAIR) 10 MG tablet Take 1 tablet by mouth daily.    . Naftifine HCl (NAFTIN) 2 % CREA Apply 1 application topically 1 day or 1 dose. 60 g 2  . NOVOLOG FLEXPEN 100 UNIT/ML FlexPen     . Omega-3 Fatty Acids (FISH OIL) 1000 MG CAPS Take by mouth 2 (two) times daily.    Marland Kitchen omeprazole (PRILOSEC) 20 MG capsule Take 2 capsules by mouth daily.    Marland Kitchen oxyCODONE-acetaminophen (PERCOCET) 10-325 MG per tablet  Take 1 tablet by mouth every 6 (six) hours as needed for pain.    . polyethylene glycol (MIRALAX / GLYCOLAX) packet Take 17 g by mouth daily as needed. 14 each 8  . predniSONE (DELTASONE) 5 MG tablet Take 1 tablet by mouth daily.    Marland Kitchen senna (SENOKOT) 8.6 MG tablet Take 1 tablet by mouth daily.    . silver sulfADIAZINE (SILVADENE) 1 % cream Apply 1 application topically daily. (Patient not taking: Reported on 04/04/2015) 50 g 0  . simvastatin (ZOCOR) 40 MG tablet Take 1 tablet by mouth daily.    . traMADol (ULTRAM) 50 MG tablet Take 2 tablets (100 mg total) by mouth every 6 (six) hours. (Patient not taking: Reported on 04/04/2015) 100 tablet 1  . ULORIC 80 MG TABS Take 1 tablet by mouth every other day.    . VENTOLIN HFA 108 (90 BASE) MCG/ACT inhaler Inhale 2 puffs into the  lungs 2 (two) times daily.    . vitamin B-12 (CYANOCOBALAMIN) 500 MCG tablet Take 1,000 mcg by mouth daily.     No current facility-administered medications on file prior to visit.   Allergies  Allergen Reactions  . Levaquin [Levofloxacin In D5w] Other (See Comments)    Kidney shut down  . Milk-Related Compounds Nausea And Vomiting  . Septra Ds [Sulfamethoxazole-Trimethoprim]     Labs: HEMOGLOBIN A1C- No recent lab on file  Objective: General: Patient is awake, alert, and oriented x 3 and in no acute distress.  Integument: Skin is warm, dry and supple bilateral. Nails are tender, long, thickened and  dystrophic with subungual debris, consistent with onychomycosis, 1-5 bilateral; nail spicules are noted at both hallux nails from previous nail avulsions. Mild scaly skin plantar aspects of both feet with no signs of infection. No open lesions or preulcerative lesions present bilateral. Remaining integument unremarkable.  Vasculature:  Dorsalis Pedis pulse 2/4 bilateral. Posterior Tibial pulse  2/4 bilateral.  Capillary fill time <3 sec 1-5 bilateral. No hair growth to the level of the digits. Temperature gradient within normal limits. + hyperpigmentation and varicosities present bilateral. No edema present bilateral.   Neurology: The patient has intact sensation measured with a 5.07/10g Semmes Weinstein Monofilament at all pedal sites bilateral . Vibratory sensation diminished bilateral with tuning fork. No Babinski sign present bilateral.   Musculoskeletal: Mild asymptomatic bunion and hammer  deformities noted bilateral. Muscular strength 5/5 in all lower extremity muscular groups bilateral without pain or limitation on range of motion . No tenderness with calf compression bilateral.  Assessment and Plan: Problem List Items Addressed This Visit      Endocrine   DM (diabetes mellitus) ([place]) (Chronic)    Other Visit Diagnoses    Dermatophytosis of nail    -  Primary    Relevant  Medications    ketoconazole (NIZORAL) 2 % cream    Tinea pedis of both feet        Relevant Medications    ketoconazole (NIZORAL) 2 % cream    Pain of toe, unspecified laterality           -Examined patient. -Discussed and educated patient on diabetic foot care, especially with  regards to the vascular, neurological and musculoskeletal systems.  -Stressed the importance of good glycemic control and the detriment of not  controlling glucose levels in relation to the foot. -Mechanically debrided all nails 1-5 bilateral using sterile nail nipper and filed with dremel without incident  -Refilled Ketoconazole cream for tinea  -Answered all patient questions -  Patient to return as needed or in 3 months for at risk foot care -Patient advised to call the office if any problems or questions arise in the  Meantime.  Landis Martins, DPM

## 2015-05-09 ENCOUNTER — Other Ambulatory Visit: Payer: Self-pay

## 2015-05-09 DIAGNOSIS — K5900 Constipation, unspecified: Secondary | ICD-10-CM

## 2015-05-09 MED ORDER — POLYETHYLENE GLYCOL 3350 17 G PO PACK: 17.0000 g | PACK | Freq: Every day | ORAL | Status: DC | PRN

## 2015-05-16 ENCOUNTER — Inpatient Hospital Stay: Payer: Medicare Other | Attending: Internal Medicine

## 2015-05-16 ENCOUNTER — Inpatient Hospital Stay: Payer: Medicare Other

## 2015-05-16 VITALS — BP 124/78 | HR 79 | Temp 98.0°F | Resp 18

## 2015-05-16 DIAGNOSIS — D751 Secondary polycythemia: Secondary | ICD-10-CM

## 2015-05-16 LAB — HEMOGLOBIN AND HEMATOCRIT, BLOOD
HEMATOCRIT: 51.8 % (ref 40.0–52.0)
Hemoglobin: 17.3 g/dL (ref 13.0–18.0)

## 2015-06-01 ENCOUNTER — Telehealth: Payer: Self-pay | Admitting: Gastroenterology

## 2015-06-01 NOTE — Telephone Encounter (Signed)
colonoscopy

## 2015-06-05 NOTE — Telephone Encounter (Signed)
Phoned Patient He said is being referred somewhere else per patient

## 2015-06-14 ENCOUNTER — Encounter: Payer: Self-pay | Admitting: General Surgery

## 2015-06-14 ENCOUNTER — Ambulatory Visit (INDEPENDENT_AMBULATORY_CARE_PROVIDER_SITE_OTHER): Payer: Medicare Other | Admitting: General Surgery

## 2015-06-14 VITALS — BP 124/76 | HR 76 | Resp 12 | Ht 67.0 in | Wt 206.0 lb

## 2015-06-14 DIAGNOSIS — I739 Peripheral vascular disease, unspecified: Secondary | ICD-10-CM

## 2015-06-14 DIAGNOSIS — Z1211 Encounter for screening for malignant neoplasm of colon: Secondary | ICD-10-CM | POA: Diagnosis not present

## 2015-06-14 NOTE — Progress Notes (Signed)
Patient ID: Colton Carter., male   DOB: 05-May-1946, 69 y.o.   MRN: [id number]  Chief Complaint  Patient presents with  . Colonoscopy    HPI Colton Carter. is a 69 y.o. male.  Here today to discuss having a colonoscopy. Bowels move regularly with the assistance of miralax. Last colonoscopy was 2011-no polyps found. History of diverticulosis. No gastrointestinal symptoms. No family history of colon cancer or colon polyps. I have reviewed the history of present illness with the patient.  HPI  Past Medical History  Diagnosis Date  . Coronary artery disease   . Diabetes mellitus   . Hypertension   . Arthritis   . High cholesterol   . Renal disorder   . Chronic neck pain   . Polycythemia, secondary 01/10/2015  . COPD (chronic obstructive pulmonary disease) ([place])   . Peripheral vascular disease ([place])   . Skin cancer 2014    nose  . Fatty liver   . Pancreatitis   . Diverticulosis     Past Surgical History  Procedure Laterality Date  . Cervical fusion    . Appendectomy    . Cholecystectomy    . Peripheral vascular catheterization N/A 02/06/2015    Procedure: Abdominal Aortogram w/Lower Extremity;  Surgeon: Katha Cabal, MD;  Location: [facility];  Service: Cardiovascular;  Laterality: N/A;  . Peripheral vascular catheterization  02/06/2015    Procedure: Lower Extremity Intervention;  Surgeon: Katha Cabal, MD;  Location: [facility];  Service: Cardiovascular;;  . Peripheral vascular catheterization Left 03/20/2015    Procedure: Lower Extremity Angiography;  Surgeon: Katha Cabal, MD;  Location: [facility];  Service: Cardiovascular;  Laterality: Left;  . Peripheral vascular catheterization  03/20/2015    Procedure: Lower Extremity Intervention;  Surgeon: Katha Cabal, MD;  Location: [facility];  Service: Cardiovascular;;  . Colonoscopy  2011    Dr [name]  . Skin cancer excision  2014  . Upper gi endoscopy      Family  History  Problem Relation Age of Onset  . Pancreatic cancer Father     Social History Social History  Substance Use Topics  . Smoking status: Current Every Day Smoker -- 0.25 packs/day for 50 years    Types: Cigarettes  . Smokeless tobacco: Never Used  . Alcohol Use: No    Allergies  Allergen Reactions  . Levaquin [Levofloxacin In D5w] Other (See Comments)    Kidney shut down  . Milk-Related Compounds Nausea And Vomiting  . Septra Ds [Sulfamethoxazole-Trimethoprim]     Current Outpatient Prescriptions  Medication Sig Dispense Refill  . ADVAIR DISKUS 250-50 MCG/DOSE AEPB     . ALPRAZolam (XANAX) 0.25 MG tablet Take 0.25-0.5 mg by mouth 3 (three) times daily. Takes 2 tabs in am, 1 at lunch and 1 at bedtime    . aspirin 81 MG tablet Take 81 mg by mouth daily.    Marland Kitchen atenolol (TENORMIN) 25 MG tablet Take 1 tablet by mouth daily.    . Canagliflozin (INVOKANA) 300 MG TABS Take by mouth daily.    . Cholecalciferol (VITAMIN D-3) 5000 UNITS TABS Take by mouth 2 (two) times daily.    Colton Carter SURECLICK 50 MG/ML injection     . furosemide (LASIX) 40 MG tablet Take 40 mg by mouth.    . gabapentin (NEURONTIN) 600 MG tablet Take 1 tablet by mouth 4 (four) times daily.    . insulin aspart (NOVOLOG) 100  UNIT/ML injection Inject into the skin 3 (three) times daily before meals. Sliding scale three times a day at mealtime    . insulin glargine (LANTUS) 100 UNIT/ML injection Inject 60 Units into the skin at bedtime.    . insulin glargine (LANTUS) 100 UNIT/ML injection Inject 60 Units into the skin once. BEFORE BREAKFAST    . ketoconazole (NIZORAL) 2 % cream Apply 1 application topically daily. 60 g 2  . levocetirizine ([name]) 5 MG tablet Take 1 tablet by mouth daily.    . montelukast (SINGULAIR) 10 MG tablet Take 1 tablet by mouth daily.    . Naftifine HCl (NAFTIN) 2 % CREA Apply 1 application topically 1 day or 1 dose. 60 g 2  . NOVOLOG FLEXPEN 100 UNIT/ML FlexPen     . Omega-3 Fatty Acids (FISH  OIL) 1000 MG CAPS Take by mouth 2 (two) times daily.    Marland Kitchen omeprazole (PRILOSEC) 20 MG capsule Take 2 capsules by mouth daily.    Marland Kitchen oxyCODONE-acetaminophen (PERCOCET) 10-325 MG per tablet Take 1 tablet by mouth every 6 (six) hours as needed for pain.    . polyethylene glycol (MIRALAX / GLYCOLAX) packet Take 17 g by mouth daily as needed. 24 each 6  . predniSONE (DELTASONE) 5 MG tablet Take 1 tablet by mouth daily.    Marland Kitchen senna (SENOKOT) 8.6 MG tablet Take 1 tablet by mouth daily.    . simvastatin (ZOCOR) 40 MG tablet Take 1 tablet by mouth daily.    Marland Kitchen ULORIC 80 MG TABS Take 1 tablet by mouth every other day.    . VENTOLIN HFA 108 (90 BASE) MCG/ACT inhaler Inhale 2 puffs into the lungs 2 (two) times daily.    . vitamin B-12 (CYANOCOBALAMIN) 500 MCG tablet Take 1,000 mcg by mouth daily.    . vitamin C (ASCORBIC ACID) 500 MG tablet Take 500 mg by mouth daily.     No current facility-administered medications for this visit.    Review of Systems Review of Systems  Constitutional: Negative.   Respiratory: Negative.   Cardiovascular: Negative.     Blood pressure 124/76, pulse 76, resp. rate 12, height 5\' 7"  (1.702 m), weight 206 lb (93.441 kg).  Physical Exam Physical Exam  Constitutional: He is oriented to person, place, and time. He appears well-developed and well-nourished.  Eyes: Conjunctivae are normal. No scleral icterus.  Neck: Neck supple.  Cardiovascular: Normal rate and normal heart sounds.  An irregular rhythm present.  No murmur heard. Pulmonary/Chest: Effort normal and breath sounds normal.  Abdominal: Soft. Bowel sounds are normal. He exhibits no distension. There is no tenderness. No hernia.  Neurological: He is alert and oriented to person, place, and time.  Skin: Skin is warm and dry.  Psychiatric: He has a normal mood and affect. His behavior is normal.    Data Reviewed Progress notes, previous colonoscopy reviewed  Assessment    Screening colonoscopy not indicated  at this time as he is at average risk and requires screening only every 10 years, but it is reasonable to perform FOBT. If normal he will not need colonoscopy till 2021.     Plan    Discussed plan fully with patient and he is agreeable.       PCP:  Kris Hartmann 06/14/2015, 2:30 PM

## 2015-06-14 NOTE — Patient Instructions (Signed)
Call with any questions

## 2015-06-17 LAB — FECAL OCCULT BLOOD, IMMUNOCHEMICAL: Fecal Occult Bld: POSITIVE — AB

## 2015-06-19 ENCOUNTER — Telehealth: Payer: Self-pay

## 2015-06-19 NOTE — Telephone Encounter (Signed)
-----   Message from Christene Lye, MD sent at 06/19/2015  8:09 AM EST ----- Please advice pt he has blood in stool. Needs an appt to schedule colonoscopy

## 2015-06-21 NOTE — Telephone Encounter (Signed)
Patient verbalized understanding, aware of appointment to discuss colonoscopy on 06/27/15.

## 2015-06-27 ENCOUNTER — Inpatient Hospital Stay: Payer: Medicare Other

## 2015-06-27 ENCOUNTER — Inpatient Hospital Stay (HOSPITAL_BASED_OUTPATIENT_CLINIC_OR_DEPARTMENT_OTHER): Payer: Medicare Other | Admitting: Internal Medicine

## 2015-06-27 ENCOUNTER — Inpatient Hospital Stay: Payer: Medicare Other | Attending: Internal Medicine

## 2015-06-27 VITALS — BP 118/75 | HR 87 | Ht 67.0 in | Wt 208.3 lb

## 2015-06-27 DIAGNOSIS — I10 Essential (primary) hypertension: Secondary | ICD-10-CM | POA: Diagnosis not present

## 2015-06-27 DIAGNOSIS — D45 Polycythemia vera: Secondary | ICD-10-CM

## 2015-06-27 DIAGNOSIS — Z85828 Personal history of other malignant neoplasm of skin: Secondary | ICD-10-CM | POA: Insufficient documentation

## 2015-06-27 DIAGNOSIS — Z79899 Other long term (current) drug therapy: Secondary | ICD-10-CM | POA: Insufficient documentation

## 2015-06-27 DIAGNOSIS — J449 Chronic obstructive pulmonary disease, unspecified: Secondary | ICD-10-CM

## 2015-06-27 DIAGNOSIS — Z794 Long term (current) use of insulin: Secondary | ICD-10-CM | POA: Insufficient documentation

## 2015-06-27 DIAGNOSIS — R918 Other nonspecific abnormal finding of lung field: Secondary | ICD-10-CM

## 2015-06-27 DIAGNOSIS — E119 Type 2 diabetes mellitus without complications: Secondary | ICD-10-CM | POA: Diagnosis not present

## 2015-06-27 DIAGNOSIS — F17210 Nicotine dependence, cigarettes, uncomplicated: Secondary | ICD-10-CM

## 2015-06-27 DIAGNOSIS — I739 Peripheral vascular disease, unspecified: Secondary | ICD-10-CM | POA: Diagnosis not present

## 2015-06-27 DIAGNOSIS — I2510 Atherosclerotic heart disease of native coronary artery without angina pectoris: Secondary | ICD-10-CM | POA: Insufficient documentation

## 2015-06-27 DIAGNOSIS — G8929 Other chronic pain: Secondary | ICD-10-CM | POA: Diagnosis not present

## 2015-06-27 DIAGNOSIS — E7800 Pure hypercholesterolemia, unspecified: Secondary | ICD-10-CM | POA: Insufficient documentation

## 2015-06-27 DIAGNOSIS — Z8719 Personal history of other diseases of the digestive system: Secondary | ICD-10-CM | POA: Diagnosis not present

## 2015-06-27 DIAGNOSIS — Z7982 Long term (current) use of aspirin: Secondary | ICD-10-CM | POA: Insufficient documentation

## 2015-06-27 DIAGNOSIS — M542 Cervicalgia: Secondary | ICD-10-CM | POA: Insufficient documentation

## 2015-06-27 DIAGNOSIS — D751 Secondary polycythemia: Secondary | ICD-10-CM | POA: Insufficient documentation

## 2015-06-27 DIAGNOSIS — R51 Headache: Secondary | ICD-10-CM | POA: Insufficient documentation

## 2015-06-27 DIAGNOSIS — K760 Fatty (change of) liver, not elsewhere classified: Secondary | ICD-10-CM

## 2015-06-27 DIAGNOSIS — M129 Arthropathy, unspecified: Secondary | ICD-10-CM

## 2015-06-27 DIAGNOSIS — N2889 Other specified disorders of kidney and ureter: Secondary | ICD-10-CM | POA: Diagnosis not present

## 2015-06-27 LAB — CBC WITH DIFFERENTIAL/PLATELET
BASOS ABS: 0.1 10*3/uL (ref 0–0.1)
Basophils Relative: 2 %
EOS ABS: 0.4 10*3/uL (ref 0–0.7)
EOS PCT: 5 %
HCT: 50.4 % (ref 40.0–52.0)
Hemoglobin: 17.2 g/dL (ref 13.0–18.0)
LYMPHS PCT: 31 %
Lymphs Abs: 2.8 10*3/uL (ref 1.0–3.6)
MCH: 31.2 pg (ref 26.0–34.0)
MCHC: 34.1 g/dL (ref 32.0–36.0)
MCV: 91.4 fL (ref 80.0–100.0)
Monocytes Absolute: 0.6 10*3/uL (ref 0.2–1.0)
Monocytes Relative: 7 %
NEUTROS PCT: 55 %
Neutro Abs: 4.9 10*3/uL (ref 1.4–6.5)
PLATELETS: 121 10*3/uL — AB (ref 150–440)
RBC: 5.52 MIL/uL (ref 4.40–5.90)
RDW: 14 % (ref 11.5–14.5)
WBC: 8.9 10*3/uL (ref 3.8–10.6)

## 2015-06-27 NOTE — Progress Notes (Signed)
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 I reviewed the patient's prior charts/pertinent labs/imaging in detail; findings are summarized above.    A pleasant 69 year old Caucasian male patient with a history of smoking;  Currently smoking about 5 cigarettes a day is here for follow-up for his secondary erythrocytosis.   Patient complains of worsening headaches especially  If his hemoglobin starts going up. He notes to have improvement of the headaches after phlebotomy.   REVIEW OF SYSTEMS: is no chest pain or shortness of breath. Patient's chronic arthritis.  PAST MEDICAL HISTORY :  Past Medical History  Diagnosis Date  . Coronary artery disease   . Diabetes mellitus   . Hypertension   . Arthritis   . High cholesterol   . Renal disorder   . Chronic neck pain   . Polycythemia, secondary 01/10/2015  . COPD (chronic obstructive pulmonary disease) ([place])   . Peripheral vascular disease ([place])   . Skin cancer 2014    nose  . Fatty liver   . Pancreatitis   . Diverticulosis     PAST SURGICAL HISTORY :   Past Surgical History  Procedure Laterality Date  . Cervical fusion    . Appendectomy    . Cholecystectomy    . Peripheral vascular catheterization N/A 02/06/2015    Procedure: Abdominal Aortogram w/Lower Extremity;  Surgeon: Katha Cabal, MD;  Location: [facility];  Service: Cardiovascular;  Laterality: N/A;  . Peripheral vascular catheterization  02/06/2015    Procedure: Lower Extremity  Intervention;  Surgeon: Katha Cabal, MD;  Location: [facility];  Service: Cardiovascular;;  . Peripheral vascular catheterization Left 03/20/2015    Procedure: Lower Extremity Angiography;  Surgeon: Katha Cabal, MD;  Location: [facility];  Service: Cardiovascular;  Laterality: Left;  . Peripheral vascular catheterization  03/20/2015    Procedure: Lower Extremity Intervention;  Surgeon: Katha Cabal, MD;  Location: [facility];  Service: Cardiovascular;;  . Colonoscopy  2011    Dr [name]  . Skin cancer excision  2014  . Upper gi endoscopy      FAMILY HISTORY :   Family History  Problem Relation Age of Onset  . Pancreatic cancer Father     SOCIAL HISTORY:   Social History  Substance Use Topics  . Smoking status: Current Every Day Smoker -- 0.25 packs/day for 50 years    Types: Cigarettes  . Smokeless tobacco: Never Used  . Alcohol Use: No    ALLERGIES:  is allergic to levaquin; milk-related compounds; and septra ds.  MEDICATIONS:  Current Outpatient Prescriptions  Medication Sig Dispense Refill  . ADVAIR DISKUS 250-50 MCG/DOSE AEPB     . ALPRAZolam (XANAX) 0.25 MG tablet Take 0.25-0.5 mg by mouth 3 (three) times daily. Takes 2 tabs in am, 1 at lunch and 1 at bedtime    . aspirin 81 MG tablet Take 81 mg by mouth daily.    Marland Kitchen atenolol (TENORMIN) 25 MG tablet Take 1 tablet by mouth daily.    . Cholecalciferol (  VITAMIN D-3) 5000 UNITS TABS Take by mouth 2 (two) times daily.    Scarlette Shorts SURECLICK 50 MG/ML injection     . furosemide (LASIX) 40 MG tablet Take 40 mg by mouth.    Marland Kitchen HYDROcodone-acetaminophen (NORCO) 10-325 MG tablet Take 1 tablet by mouth every 6 (six) hours as needed.    . insulin aspart (NOVOLOG) 100 UNIT/ML injection Inject into the skin 3 (three) times daily before meals. Sliding scale three times a day at mealtime    . insulin glargine (LANTUS) 100 UNIT/ML injection Inject 60 Units into the skin at bedtime.    Marland Kitchen NOVOLOG  FLEXPEN 100 UNIT/ML FlexPen     . Omega-3 Fatty Acids (FISH OIL) 1000 MG CAPS Take by mouth 2 (two) times daily.    . polyethylene glycol (MIRALAX / GLYCOLAX) packet Take 17 g by mouth daily as needed. 24 each 6  . predniSONE (DELTASONE) 5 MG tablet Take 1 tablet by mouth daily.    Marland Kitchen senna (SENOKOT) 8.6 MG tablet Take 1 tablet by mouth daily.    . simvastatin (ZOCOR) 40 MG tablet Take 1 tablet by mouth daily.    Marland Kitchen ULORIC 80 MG TABS Take 1 tablet by mouth every other day.    . VENTOLIN HFA 108 (90 BASE) MCG/ACT inhaler Inhale 2 puffs into the lungs 2 (two) times daily.    . vitamin B-12 (CYANOCOBALAMIN) 500 MCG tablet Take 1,000 mcg by mouth daily.    . vitamin C (ASCORBIC ACID) 500 MG tablet Take 500 mg by mouth daily.    . baclofen (LIORESAL) 10 MG tablet Take by mouth.    . canagliflozin (INVOKANA) 300 MG TABS tablet Take by mouth.    . gabapentin (NEURONTIN) 600 MG tablet Take by mouth.    . insulin glargine (LANTUS) 100 UNIT/ML injection Inject 60 Units into the skin once. BEFORE BREAKFAST    . ketoconazole (NIZORAL) 2 % cream Apply 1 application topically daily. 60 g 2  . levocetirizine ([name]) 5 MG tablet Take by mouth.    . montelukast (SINGULAIR) 10 MG tablet Take by mouth.    Marland Kitchen omeprazole (PRILOSEC) 20 MG capsule Take by mouth.     No current facility-administered medications for this visit.    PHYSICAL EXAMINATION:  BP 118/75 mmHg  Pulse 87  Ht 5\' 7"  (1.702 m)  Wt 208 lb 5.4 oz (94.5 kg)  BMI 32.62 kg/m2  SpO2 95%  Filed Weights   06/27/15 1444  Weight: 208 lb 5.4 oz (94.5 kg)    GENERAL: Well-nourished well-developed; Alert, no distress and comfortable.  Alone.  EYES: no pallor or icterus OROPHARYNX: no thrush or ulceration NECK: supple, no masses felt LYMPH:  no palpable lymphadenopathy in the cervical, axillary or inguinal regions LUNGS: decreased breath sounds bilaterally. No wheeze or crackles HEART/CVS: regular rate & rhythm and no murmurs; No lower extremity  edema ABDOMEN:abdomen soft, non-tender and normal bowel sounds Musculoskeletal:no cyanosis of digits and no clubbing  PSYCH: alert & oriented x 3 with fluent speech NEURO: no focal motor/sensory deficits SKIN:  no rashes or significant lesions  LABORATORY DATA:  I have reviewed the data as listed    Component Value Date/Time   NA 144 03/20/2015 0739   NA 140 03/24/2014 1308   K 3.6 03/20/2015 0739   K 3.6 03/24/2014 1308   CL 108 03/20/2015 0739   CL 105 03/24/2014 1308   CO2 29 03/20/2015 0739   CO2 28 03/24/2014 1308   GLUCOSE 109*  03/20/2015 0739   GLUCOSE 155* 03/24/2014 1308   BUN 26* 03/20/2015 0739   BUN 17 03/24/2014 1308   CREATININE 1.30* 03/20/2015 0739   CREATININE 1.33* 03/24/2014 1308   CALCIUM 8.9 03/20/2015 0739   CALCIUM 8.1* 03/24/2014 1308   PROT 6.4 03/24/2014 1308   PROT 6.3 09/02/2007 1256   ALBUMIN 3.0* 03/24/2014 1308   ALBUMIN 3.1* 04/18/2012 0435   AST 34 03/24/2014 1308   AST 34 09/02/2007 1256   ALT 34 03/24/2014 1308   ALT 35 09/02/2007 1256   ALKPHOS 104 03/24/2014 1308   ALKPHOS 36* 09/02/2007 1256   BILITOT 0.4 03/24/2014 1308   BILITOT 0.8 09/02/2007 1256   GFRNONAA 55* 03/20/2015 0739   GFRNONAA 55* 03/24/2014 1308   GFRNONAA 36* 07/15/2011 0528   GFRAA >60 03/20/2015 0739   GFRAA >60 03/24/2014 1308   GFRAA 44* 07/15/2011 0528    No results found for: SPEP, UPEP  Lab Results  Component Value Date   WBC 8.9 06/27/2015   NEUTROABS 4.9 06/27/2015   HGB 17.2 06/27/2015   HCT 50.4 06/27/2015   MCV 91.4 06/27/2015   PLT 121* 06/27/2015      Chemistry      Component Value Date/Time   NA 144 03/20/2015 0739   NA 140 03/24/2014 1308   K 3.6 03/20/2015 0739   K 3.6 03/24/2014 1308   CL 108 03/20/2015 0739   CL 105 03/24/2014 1308   CO2 29 03/20/2015 0739   CO2 28 03/24/2014 1308   BUN 26* 03/20/2015 0739   BUN 17 03/24/2014 1308   CREATININE 1.30* 03/20/2015 0739   CREATININE 1.33* 03/24/2014 1308      Component Value  Date/Time   CALCIUM 8.9 03/20/2015 0739   CALCIUM 8.1* 03/24/2014 1308   ALKPHOS 104 03/24/2014 1308   ALKPHOS 36* 09/02/2007 1256   AST 34 03/24/2014 1308   AST 34 09/02/2007 1256   ALT 34 03/24/2014 1308   ALT 35 09/02/2007 1256   BILITOT 0.4 03/24/2014 1308   BILITOT 0.8 09/02/2007 1256       RADIOGRAPHIC STUDIES: I have personally reviewed the radiological images as listed and agreed with the findings in the report. No results found.   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 Patient symptomatically feels better post phlebotomy. I recommend phlebotomy if hematocrit is greater than 50. Plan to have phlebotomy ordered every 8 weeks. Today hematocrit is a 51; second phlebotomy today.  # recommended quitting smoking.  # hemoglobin and hematocrit ;with possible phlebotomyevery 2 months;follow-up with me in 4 months/  phlebotomy.  # 15 minutes face-to-face with the patient discussing the above plan of care; more than 50% of time spent on prognosis/ natural history; counseling and coordination.      Cammie Sickle, MD 06/27/2015 2:57 PM

## 2015-06-28 ENCOUNTER — Ambulatory Visit (INDEPENDENT_AMBULATORY_CARE_PROVIDER_SITE_OTHER): Payer: Medicare Other | Admitting: General Surgery

## 2015-06-28 ENCOUNTER — Encounter: Payer: Self-pay | Admitting: General Surgery

## 2015-06-28 VITALS — BP 130/78 | HR 82 | Resp 14 | Ht 67.0 in | Wt 211.0 lb

## 2015-06-28 DIAGNOSIS — R195 Other fecal abnormalities: Secondary | ICD-10-CM | POA: Diagnosis not present

## 2015-06-28 DIAGNOSIS — I739 Peripheral vascular disease, unspecified: Secondary | ICD-10-CM | POA: Diagnosis not present

## 2015-06-28 DIAGNOSIS — R143 Flatulence: Secondary | ICD-10-CM

## 2015-06-28 MED ORDER — POLYETHYLENE GLYCOL 3350 17 GM/SCOOP PO POWD: ORAL | Status: DC

## 2015-06-28 NOTE — Progress Notes (Signed)
Patient ID: Colton Rainwater., male   DOB: [birth year], 69 y.o.   MRN: [id number]  Chief Complaint  Patient presents with  . Colonoscopy    HPI Colton Swee. is a 69 y.o. male.  Here today to discuss having a colonoscopy. Bowels move regularly with the assistance of miralax. He states he does feel bloated and often burps a lot or expels gas. He also states this has been worse of recent Last colonoscopy was 2011-no polyps found. History of diverticulosis. No gastrointestinal symptoms. No family history of colon cancer or colon polyps. He had his stress test last week. Stool for occult blood was positive on 06-14-15. I have reviewed the history of present illness with the patient. HPI  Past Medical History  Diagnosis Date  . Coronary artery disease   . Diabetes mellitus   . Hypertension   . Arthritis   . High cholesterol   . Renal disorder   . Chronic neck pain   . Polycythemia, secondary 01/10/2015  . COPD (chronic obstructive pulmonary disease) ([place])   . Peripheral vascular disease ([place])   . Skin cancer 2014    nose  . Fatty liver   . Pancreatitis   . Diverticulosis     Past Surgical History  Procedure Laterality Date  . Cervical fusion    . Appendectomy    . Cholecystectomy    . Peripheral vascular catheterization N/A 02/06/2015    Procedure: Abdominal Aortogram w/Lower Extremity;  Surgeon: Katha Cabal, MD;  Location: [facility] [facility];  Service: Cardiovascular;  Laterality: N/A;  . Peripheral vascular catheterization  02/06/2015    Procedure: Lower Extremity Intervention;  Surgeon: Katha Cabal, MD;  Location: [place] [facility];  Service: Cardiovascular;;  . Peripheral vascular catheterization Left 03/20/2015    Procedure: Lower Extremity Angiography;  Surgeon: Katha Cabal, MD;  Location: [place] [facility];  Service: Cardiovascular;  Laterality: Left;  . Peripheral vascular catheterization  03/20/2015    Procedure: Lower Extremity Intervention;  Surgeon:  Katha Cabal, MD;  Location: [place] [facility];  Service: Cardiovascular;;  . Colonoscopy  2011    Dr [name]  . Skin cancer excision  2014  . Upper gi endoscopy      Family History  Problem Relation Age of Onset  . Pancreatic cancer Father     Social History Social History  Substance Use Topics  . Smoking status: Current Every Day Smoker -- 0.25 packs/day for 50 years    Types: Cigarettes  . Smokeless tobacco: Never Used  . Alcohol Use: No    Allergies  Allergen Reactions  . Levaquin [Levofloxacin In D5w] Other (See Comments)    Kidney shut down  . Milk-Related Compounds Nausea And Vomiting  . Septra Ds [Sulfamethoxazole-Trimethoprim]     Current Outpatient Prescriptions  Medication Sig Dispense Refill  . ADVAIR DISKUS 250-50 MCG/DOSE AEPB     . ALPRAZolam (XANAX) 0.25 MG tablet Take 0.25 mg by mouth 3 (three) times daily. 2 tabs am and 1 lunch 1 bedtime    . aspirin 81 MG tablet Take 81 mg by mouth daily.    Marland Kitchen atenolol (TENORMIN) 25 MG tablet Take 1 tablet by mouth daily.    . baclofen (LIORESAL) 10 MG tablet Take by mouth.    . canagliflozin (INVOKANA) 300 MG TABS tablet Take by mouth.    . Cholecalciferol (VITAMIN D-3) 5000 UNITS TABS Take by mouth 2 (two) times daily.    Scarlette Shorts  SURECLICK 50 MG/ML injection     . furosemide (LASIX) 40 MG tablet Take 40 mg by mouth.    . gabapentin (NEURONTIN) 600 MG tablet Take by mouth.    Marland Kitchen HYDROcodone-acetaminophen (NORCO) 10-325 MG tablet Take 1 tablet by mouth every 6 (six) hours as needed.    . insulin aspart (NOVOLOG) 100 UNIT/ML injection Inject into the skin 3 (three) times daily before meals. Sliding scale three times a day at mealtime    . insulin glargine (LANTUS) 100 UNIT/ML injection Inject 65 Units into the skin at bedtime.     . insulin glargine (LANTUS) 100 UNIT/ML injection Inject 65 Units into the skin once. BEFORE BREAKFAST    . ketoconazole (NIZORAL) 2 % cream Apply 1 application topically daily. 60 g 2   . levocetirizine ([name]) 5 MG tablet Take by mouth.    . montelukast (SINGULAIR) 10 MG tablet Take by mouth.    Marland Kitchen NOVOLOG FLEXPEN 100 UNIT/ML FlexPen     . Omega-3 Fatty Acids (FISH OIL) 1000 MG CAPS Take by mouth 2 (two) times daily.    Marland Kitchen omeprazole (PRILOSEC) 20 MG capsule Take by mouth.    . polyethylene glycol (MIRALAX / GLYCOLAX) packet Take 17 g by mouth daily as needed. 24 each 6  . predniSONE (DELTASONE) 5 MG tablet Take 1 tablet by mouth daily.    Marland Kitchen senna (SENOKOT) 8.6 MG tablet Take 1 tablet by mouth daily.    . simvastatin (ZOCOR) 40 MG tablet Take 1 tablet by mouth daily.    Marland Kitchen ULORIC 80 MG TABS Take 1 tablet by mouth every other day.    . VENTOLIN HFA 108 (90 BASE) MCG/ACT inhaler Inhale 2 puffs into the lungs 2 (two) times daily.    . vitamin B-12 (CYANOCOBALAMIN) 500 MCG tablet Take 1,000 mcg by mouth daily.    . vitamin C (ASCORBIC ACID) 500 MG tablet Take 500 mg by mouth daily.     No current facility-administered medications for this visit.    Review of Systems Review of Systems  Constitutional: Negative.   Respiratory: Negative.   Cardiovascular: Negative.   Gastrointestinal: Negative for nausea, vomiting, diarrhea and constipation.    Blood pressure 130/78, pulse 82, resp. rate 14, height 5\' 7"  (1.702 m), weight 211 lb (95.709 kg).  Physical Exam Physical Exam  Constitutional: He is oriented to person, place, and time. He appears well-developed and well-nourished.  HENT:  Mouth/Throat: Oropharynx is clear and moist.  Eyes: Conjunctivae are normal. No scleral icterus.  Neck: Neck supple.  Cardiovascular: Normal rate, regular rhythm and normal heart sounds.   Pulmonary/Chest: Effort normal and breath sounds normal.  Abdominal: Soft. Normal appearance. There is no tenderness. No hernia.  Lymphadenopathy:    He has no cervical adenopathy.  Neurological: He is alert and oriented to person, place, and time.  Skin: Skin is warm and dry.  Psychiatric: His  behavior is normal.    Data Reviewed Progress notes.  Assessment    Heme positive stool. Feel upper endoscopy and colonoscopy is appropriate. Discussed fully with pt and he is agreeable.    Plan     Upper endoscopy and Colonoscopy with possible biopsy/polypectomy prn: Information regarding the procedure, including its potential risks and complications (including but not limited to perforation of the bowel, which may require emergency surgery to repair, and bleeding) was verbally given to the patient. Educational information regarding lower intestinal endoscopy was given to the patient. Written instructions for how to complete the bowel  prep using Miralax were provided. The importance of drinking ample fluids to avoid dehydration as a result of the prep emphasized.  Patient has been scheduled for an upper and lower endoscopy on 07-11-15 at [facility]. This patient has been asked to discontinue fish oil one week prior to procedure. Patient can continue Invokana day of colonoscopy prep but will hold all insulin. Patient will only take blood pressure pill and inhalers morning of colonoscopy.       This information has been scribed by Karie Fetch RNBC.   PCP:  Kris Hartmann 06/28/2015, 10:42 AM

## 2015-06-28 NOTE — Patient Instructions (Addendum)
Colonoscopy A colonoscopy is an exam to look at the entire large intestine (colon). This exam can help find problems such as tumors, polyps, inflammation, and areas of bleeding. The exam takes about 1 hour.  LET [facility] CARE PROVIDER KNOW ABOUT:   Any allergies you have.  All medicines you are taking, including vitamins, herbs, eye drops, creams, and over-the-counter medicines.  Previous problems you or members of your family have had with the use of anesthetics.  Any blood disorders you have.  Previous surgeries you have had.  Medical conditions you have. RISKS AND COMPLICATIONS  Generally, this is a safe procedure. However, as with any procedure, complications can occur. Possible complications include:  Bleeding.  Tearing or rupture of the colon wall.  Reaction to medicines given during the exam.  Infection (rare). BEFORE THE PROCEDURE   Ask your health care provider about changing or stopping your regular medicines.  You may be prescribed an oral bowel prep. This involves drinking a large amount of medicated liquid, starting the day before your procedure. The liquid will cause you to have multiple loose stools until your stool is almost clear or light green. This cleans out your colon in preparation for the procedure.  Do not eat or drink anything else once you have started the bowel prep, unless your health care provider tells you it is safe to do so.  Arrange for someone to drive you home after the procedure. PROCEDURE   You will be given medicine to help you relax (sedative).  You will lie on your side with your knees bent.  A long, flexible tube with a light and camera on the end (colonoscope) will be inserted through the rectum and into the colon. The camera sends video back to a computer screen as it moves through the colon. The colonoscope also releases carbon dioxide gas to inflate the colon. This helps your health care provider see the area better.  During  the exam, your health care provider may take a small tissue sample (biopsy) to be examined under a microscope if any abnormalities are found.  The exam is finished when the entire colon has been viewed. AFTER THE PROCEDURE   Do not drive for 24 hours after the exam.  You may have a small amount of blood in your stool.  You may pass moderate amounts of gas and have mild abdominal cramping or bloating. This is caused by the gas used to inflate your colon during the exam.  Ask when your test results will be ready and how you will get your results. Make sure you get your test results.   This information is not intended to replace advice given to you by your health care provider. Make sure you discuss any questions you have with your health care provider.   Document Released: 06/27/2000 Document Revised: 04/20/2013 Document Reviewed: 03/07/2013 Elsevier Interactive Patient Education 2016 [place]. Esophagogastroduodenoscopy Esophagogastroduodenoscopy (EGD) is a procedure that is used to examine the lining of the esophagus, stomach, and first part of the small intestine (duodenum). A long, flexible, lighted tube with a camera attached (endoscope) is inserted down the throat to view these organs. This procedure is done to detect problems or abnormalities, such as inflammation, bleeding, ulcers, or growths, in order to treat them. The procedure lasts 5-20 minutes. It is usually an outpatient procedure, but it may need to be performed in a hospital in emergency cases. LET [facility] CARE PROVIDER KNOW ABOUT:  Any allergies you have.  All medicines you are taking, including vitamins, herbs, eye drops, creams, and over-the-counter medicines.  Previous problems you or members of your family have had with the use of anesthetics.  Any blood disorders you have.  Previous surgeries you have had.  Medical conditions you have. RISKS AND COMPLICATIONS Generally, this is a safe procedure. However,  problems can occur and include:  Infection.  Bleeding.  Tearing (perforation) of the esophagus, stomach, or duodenum.  Difficulty breathing or not being able to breathe.  Excessive sweating.  Spasms of the larynx.  Slowed heartbeat.  Low blood pressure. BEFORE THE PROCEDURE  Do not eat or drink anything after midnight on the night before the procedure or as directed by your health care provider.  Do not take your regular medicines before the procedure if your health care provider asks you not to. Ask your health care provider about changing or stopping those medicines.  If you wear dentures, be prepared to remove them before the procedure.  Arrange for someone to drive you home after the procedure. PROCEDURE  A numbing medicine (local anesthetic) may be sprayed in your throat for comfort and to stop you from gagging or coughing.  You will have an IV tube inserted in a vein in your hand or arm. You will receive medicines and fluids through this tube.  You will be given a medicine to relax you (sedative).  A pain reliever will be given through the IV tube.  A mouth guard may be placed in your mouth to protect your teeth and to keep you from biting on the endoscope.  You will be asked to lie on your left side.  The endoscope will be inserted down your throat and into your esophagus, stomach, and duodenum.  Air will be put through the endoscope to allow your health care provider to clearly view the lining of your esophagus.  The lining of your esophagus, stomach, and duodenum will be examined. During the exam, your health care provider may:  Remove tissue to be examined under a microscope (biopsy) for inflammation, infection, or other medical problems.  Remove growths.  Remove objects (foreign bodies) that are stuck.  Treat any bleeding with medicines or other devices that stop tissues from bleeding (hot cautery, clipping devices).  Widen (dilate) or stretch narrowed  areas of your esophagus and stomach.  The endoscope will be withdrawn. AFTER THE PROCEDURE  You will be taken to a recovery area for observation. Your blood pressure, heart rate, breathing rate, and blood oxygen level will be monitored often until the medicines you were given have worn off.  Do not eat or drink anything until the numbing medicine has worn off and your gag reflex has returned. You may choke.  Your health care provider should be able to discuss his or her findings with you. It will take longer to discuss the test results if any biopsies were taken.   This information is not intended to replace advice given to you by your health care provider. Make sure you discuss any questions you have with your health care provider.   Document Released: 10/31/2004 Document Revised: 07/21/2014 Document Reviewed: 06/02/2012 Elsevier Interactive Patient Education [employer].  Patient has been scheduled for an upper and lower endoscopy on 07-11-15 at [facility]. This patient has been asked to discontinue fish oil one week prior to procedure. Patient can continue Invokana day of colonoscopy prep but will hold all insulin. Patient will only take blood pressure pill and inhalers morning of  colonoscopy.

## 2015-07-11 ENCOUNTER — Ambulatory Visit: Payer: Medicare Other | Admitting: Anesthesiology

## 2015-07-11 ENCOUNTER — Ambulatory Visit
Admission: RE | Admit: 2015-07-11 | Discharge: 2015-07-11 | Disposition: A | Discharge status: Home / Self Care | Payer: Medicare Other | Source: Ambulatory Visit | Attending: General Surgery | Admitting: General Surgery

## 2015-07-11 ENCOUNTER — Encounter
Admission: RE | Disposition: A | Discharge status: Home / Self Care | Payer: Self-pay | Source: Ambulatory Visit | Attending: General Surgery

## 2015-07-11 ENCOUNTER — Encounter: Payer: Self-pay | Admitting: Anesthesiology

## 2015-07-11 DIAGNOSIS — D124 Benign neoplasm of descending colon: Secondary | ICD-10-CM

## 2015-07-11 DIAGNOSIS — K210 Gastro-esophageal reflux disease with esophagitis: Secondary | ICD-10-CM | POA: Insufficient documentation

## 2015-07-11 DIAGNOSIS — Z91011 Allergy to milk products: Secondary | ICD-10-CM | POA: Insufficient documentation

## 2015-07-11 DIAGNOSIS — E7800 Pure hypercholesterolemia, unspecified: Secondary | ICD-10-CM | POA: Diagnosis not present

## 2015-07-11 DIAGNOSIS — Z881 Allergy status to other antibiotic agents status: Secondary | ICD-10-CM | POA: Diagnosis not present

## 2015-07-11 DIAGNOSIS — I2510 Atherosclerotic heart disease of native coronary artery without angina pectoris: Secondary | ICD-10-CM | POA: Insufficient documentation

## 2015-07-11 DIAGNOSIS — F17210 Nicotine dependence, cigarettes, uncomplicated: Secondary | ICD-10-CM | POA: Diagnosis not present

## 2015-07-11 DIAGNOSIS — Z794 Long term (current) use of insulin: Secondary | ICD-10-CM | POA: Diagnosis not present

## 2015-07-11 DIAGNOSIS — Z85828 Personal history of other malignant neoplasm of skin: Secondary | ICD-10-CM | POA: Insufficient documentation

## 2015-07-11 DIAGNOSIS — Z7982 Long term (current) use of aspirin: Secondary | ICD-10-CM | POA: Diagnosis not present

## 2015-07-11 DIAGNOSIS — R1013 Epigastric pain: Secondary | ICD-10-CM | POA: Diagnosis not present

## 2015-07-11 DIAGNOSIS — D125 Benign neoplasm of sigmoid colon: Secondary | ICD-10-CM | POA: Diagnosis not present

## 2015-07-11 DIAGNOSIS — K3189 Other diseases of stomach and duodenum: Secondary | ICD-10-CM | POA: Diagnosis not present

## 2015-07-11 DIAGNOSIS — K319 Disease of stomach and duodenum, unspecified: Secondary | ICD-10-CM | POA: Insufficient documentation

## 2015-07-11 DIAGNOSIS — E119 Type 2 diabetes mellitus without complications: Secondary | ICD-10-CM | POA: Insufficient documentation

## 2015-07-11 DIAGNOSIS — K5730 Diverticulosis of large intestine without perforation or abscess without bleeding: Secondary | ICD-10-CM | POA: Insufficient documentation

## 2015-07-11 DIAGNOSIS — K30 Functional dyspepsia: Secondary | ICD-10-CM | POA: Diagnosis not present

## 2015-07-11 DIAGNOSIS — I739 Peripheral vascular disease, unspecified: Secondary | ICD-10-CM | POA: Diagnosis not present

## 2015-07-11 DIAGNOSIS — J449 Chronic obstructive pulmonary disease, unspecified: Secondary | ICD-10-CM | POA: Insufficient documentation

## 2015-07-11 DIAGNOSIS — M1990 Unspecified osteoarthritis, unspecified site: Secondary | ICD-10-CM | POA: Diagnosis not present

## 2015-07-11 DIAGNOSIS — I252 Old myocardial infarction: Secondary | ICD-10-CM | POA: Diagnosis not present

## 2015-07-11 DIAGNOSIS — Z79899 Other long term (current) drug therapy: Secondary | ICD-10-CM | POA: Diagnosis not present

## 2015-07-11 DIAGNOSIS — D123 Benign neoplasm of transverse colon: Secondary | ICD-10-CM | POA: Diagnosis not present

## 2015-07-11 DIAGNOSIS — R195 Other fecal abnormalities: Secondary | ICD-10-CM | POA: Diagnosis not present

## 2015-07-11 DIAGNOSIS — I10 Essential (primary) hypertension: Secondary | ICD-10-CM | POA: Diagnosis not present

## 2015-07-11 DIAGNOSIS — K209 Esophagitis, unspecified: Secondary | ICD-10-CM | POA: Diagnosis not present

## 2015-07-11 HISTORY — PX: COLONOSCOPY WITH PROPOFOL: SHX5780

## 2015-07-11 HISTORY — PX: ESOPHAGOGASTRODUODENOSCOPY (EGD) WITH PROPOFOL: SHX5813

## 2015-07-11 LAB — GLUCOSE, CAPILLARY: Glucose-Capillary: 88 mg/dL (ref 65–99)

## 2015-07-11 SURGERY — COLONOSCOPY WITH PROPOFOL
Anesthesia: General

## 2015-07-11 MED ORDER — PROPOFOL 500 MG/50ML IV EMUL
INTRAVENOUS | Status: DC | PRN
  Administered 2015-07-11: 120 ug/kg/min via INTRAVENOUS

## 2015-07-11 MED ORDER — SODIUM CHLORIDE 0.9 % IV SOLN
INTRAVENOUS | Status: DC
  Administered 2015-07-11: 1000 mL via INTRAVENOUS

## 2015-07-11 MED ORDER — ATENOLOL 25 MG PO TABS
ORAL_TABLET | ORAL | Status: AC
  Administered 2015-07-11: 25 mg via ORAL
  Filled 2015-07-11: qty 1

## 2015-07-11 MED ORDER — IPRATROPIUM-ALBUTEROL 0.5-2.5 (3) MG/3ML IN SOLN
RESPIRATORY_TRACT | Status: AC
  Administered 2015-07-11: 3 mL via RESPIRATORY_TRACT
  Filled 2015-07-11: qty 3

## 2015-07-11 MED ORDER — FENTANYL CITRATE (PF) 100 MCG/2ML IJ SOLN
INTRAMUSCULAR | Status: DC | PRN
  Administered 2015-07-11: 50 ug via INTRAVENOUS

## 2015-07-11 MED ORDER — MIDAZOLAM HCL 2 MG/2ML IJ SOLN
INTRAMUSCULAR | Status: DC | PRN
  Administered 2015-07-11: 1 mg via INTRAVENOUS

## 2015-07-11 MED ORDER — LIDOCAINE HCL (CARDIAC) 20 MG/ML IV SOLN
INTRAVENOUS | Status: DC | PRN
  Administered 2015-07-11: 30 mg via INTRAVENOUS

## 2015-07-11 NOTE — H&P (View-Only) (Signed)
Patient ID: Colton Carter., male   DOB: [birth year], 69 y.o.   MRN: [id number]  Chief Complaint  Patient presents with  . Colonoscopy    HPI Colton Carter. is a 69 y.o. male.  Here today to discuss having a colonoscopy. Bowels move regularly with the assistance of miralax. He states he does feel bloated and often burps a lot or expels gas. He also states this has been worse of recent Last colonoscopy was 2011-no polyps found. History of diverticulosis. No gastrointestinal symptoms. No family history of colon cancer or colon polyps. He had his stress test last week. Stool for occult blood was positive on 06-14-15. I have reviewed the history of present illness with the patient. HPI  Past Medical History  Diagnosis Date  . Coronary artery disease   . Diabetes mellitus   . Hypertension   . Arthritis   . High cholesterol   . Renal disorder   . Chronic neck pain   . Polycythemia, secondary 01/10/2015  . COPD (chronic obstructive pulmonary disease) ([place])   . Peripheral vascular disease ([place])   . Skin cancer 2014    nose  . Fatty liver   . Pancreatitis   . Diverticulosis     Past Surgical History  Procedure Laterality Date  . Cervical fusion    . Appendectomy    . Cholecystectomy    . Peripheral vascular catheterization N/A 02/06/2015    Procedure: Abdominal Aortogram w/Lower Extremity;  Surgeon: Katha Cabal, MD;  Location: [place] [facility];  Service: Cardiovascular;  Laterality: N/A;  . Peripheral vascular catheterization  02/06/2015    Procedure: Lower Extremity Intervention;  Surgeon: Katha Cabal, MD;  Location: [place] [facility];  Service: Cardiovascular;;  . Peripheral vascular catheterization Left 03/20/2015    Procedure: Lower Extremity Angiography;  Surgeon: Katha Cabal, MD;  Location: [place] [facility];  Service: Cardiovascular;  Laterality: Left;  . Peripheral vascular catheterization  03/20/2015    Procedure: Lower Extremity Intervention;  Surgeon:  Katha Cabal, MD;  Location: [place] [facility];  Service: Cardiovascular;;  . Colonoscopy  2011    Dr [name]  . Skin cancer excision  2014  . Upper gi endoscopy      Family History  Problem Relation Age of Onset  . Pancreatic cancer Father     Social History Social History  Substance Use Topics  . Smoking status: Current Every Day Smoker -- 0.25 packs/day for 50 years    Types: Cigarettes  . Smokeless tobacco: Never Used  . Alcohol Use: No    Allergies  Allergen Reactions  . Levaquin [Levofloxacin In D5w] Other (See Comments)    Kidney shut down  . Milk-Related Compounds Nausea And Vomiting  . Septra Ds [Sulfamethoxazole-Trimethoprim]     Current Outpatient Prescriptions  Medication Sig Dispense Refill  . ADVAIR DISKUS 250-50 MCG/DOSE AEPB     . ALPRAZolam (XANAX) 0.25 MG tablet Take 0.25 mg by mouth 3 (three) times daily. 2 tabs am and 1 lunch 1 bedtime    . aspirin 81 MG tablet Take 81 mg by mouth daily.    Marland Kitchen atenolol (TENORMIN) 25 MG tablet Take 1 tablet by mouth daily.    . baclofen (LIORESAL) 10 MG tablet Take by mouth.    . canagliflozin (INVOKANA) 300 MG TABS tablet Take by mouth.    . Cholecalciferol (VITAMIN D-3) 5000 UNITS TABS Take by mouth 2 (two) times daily.    Scarlette Shorts  SURECLICK 50 MG/ML injection     . furosemide (LASIX) 40 MG tablet Take 40 mg by mouth.    . gabapentin (NEURONTIN) 600 MG tablet Take by mouth.    Marland Kitchen HYDROcodone-acetaminophen (NORCO) 10-325 MG tablet Take 1 tablet by mouth every 6 (six) hours as needed.    . insulin aspart (NOVOLOG) 100 UNIT/ML injection Inject into the skin 3 (three) times daily before meals. Sliding scale three times a day at mealtime    . insulin glargine (LANTUS) 100 UNIT/ML injection Inject 65 Units into the skin at bedtime.     . insulin glargine (LANTUS) 100 UNIT/ML injection Inject 65 Units into the skin once. BEFORE BREAKFAST    . ketoconazole (NIZORAL) 2 % cream Apply 1 application topically daily. 60 g 2   . levocetirizine ([name]) 5 MG tablet Take by mouth.    . montelukast (SINGULAIR) 10 MG tablet Take by mouth.    Marland Kitchen NOVOLOG FLEXPEN 100 UNIT/ML FlexPen     . Omega-3 Fatty Acids (FISH OIL) 1000 MG CAPS Take by mouth 2 (two) times daily.    Marland Kitchen omeprazole (PRILOSEC) 20 MG capsule Take by mouth.    . polyethylene glycol (MIRALAX / GLYCOLAX) packet Take 17 g by mouth daily as needed. 24 each 6  . predniSONE (DELTASONE) 5 MG tablet Take 1 tablet by mouth daily.    Marland Kitchen senna (SENOKOT) 8.6 MG tablet Take 1 tablet by mouth daily.    . simvastatin (ZOCOR) 40 MG tablet Take 1 tablet by mouth daily.    Marland Kitchen ULORIC 80 MG TABS Take 1 tablet by mouth every other day.    . VENTOLIN HFA 108 (90 BASE) MCG/ACT inhaler Inhale 2 puffs into the lungs 2 (two) times daily.    . vitamin B-12 (CYANOCOBALAMIN) 500 MCG tablet Take 1,000 mcg by mouth daily.    . vitamin C (ASCORBIC ACID) 500 MG tablet Take 500 mg by mouth daily.     No current facility-administered medications for this visit.    Review of Systems Review of Systems  Constitutional: Negative.   Respiratory: Negative.   Cardiovascular: Negative.   Gastrointestinal: Negative for nausea, vomiting, diarrhea and constipation.    Blood pressure 130/78, pulse 82, resp. rate 14, height 5\' 7"  (1.702 m), weight 211 lb (95.709 kg).  Physical Exam Physical Exam  Constitutional: He is oriented to person, place, and time. He appears well-developed and well-nourished.  HENT:  Mouth/Throat: Oropharynx is clear and moist.  Eyes: Conjunctivae are normal. No scleral icterus.  Neck: Neck supple.  Cardiovascular: Normal rate, regular rhythm and normal heart sounds.   Pulmonary/Chest: Effort normal and breath sounds normal.  Abdominal: Soft. Normal appearance. There is no tenderness. No hernia.  Lymphadenopathy:    He has no cervical adenopathy.  Neurological: He is alert and oriented to person, place, and time.  Skin: Skin is warm and dry.  Psychiatric: His  behavior is normal.    Data Reviewed Progress notes.  Assessment    Heme positive stool. Feel upper endoscopy and colonoscopy is appropriate. Discussed fully with pt and he is agreeable.    Plan     Upper endoscopy and Colonoscopy with possible biopsy/polypectomy prn: Information regarding the procedure, including its potential risks and complications (including but not limited to perforation of the bowel, which may require emergency surgery to repair, and bleeding) was verbally given to the patient. Educational information regarding lower intestinal endoscopy was given to the patient. Written instructions for how to complete the bowel  prep using Miralax were provided. The importance of drinking ample fluids to avoid dehydration as a result of the prep emphasized.  Patient has been scheduled for an upper and lower endoscopy on 07-11-15 at [facility]. This patient has been asked to discontinue fish oil one week prior to procedure. Patient can continue Invokana day of colonoscopy prep but will hold all insulin. Patient will only take blood pressure pill and inhalers morning of colonoscopy.       This information has been scribed by Karie Fetch RNBC.   PCP:  Kris Hartmann 06/28/2015, 10:42 AM

## 2015-07-11 NOTE — Op Note (Signed)
[facility] Patient Name: Colton Carter Procedure Date: 07/11/2015 9:26 AM MRN: [id number] Account #: [id number] Date of Birth: July 31, 1945 Admit Type: Outpatient Age: 69 Room: [facility] ENDO ROOM 1 Gender: Male Note Status: Finalized Procedure:         Colonoscopy Indications:       Heme positive stool Providers:         Seeplaputhur G. Jamal Collin, MD Referring MD:      Casilda Carls, MD (Referring MD) Medicines:         General Anesthesia Complications:     No immediate complications. Procedure:         Pre-Anesthesia Assessment:                    - General anesthesia under the supervision of an                     anesthesiologist was determined to be medically necessary                     for this procedure based on review of the patient's                     medical history, medications, and prior anesthesia history.                    After obtaining informed consent, the colonoscope was                     passed under direct vision. Throughout the procedure, the                     patient's blood pressure, pulse, and oxygen saturations                     were monitored continuously. The Olympus CF-H180AL                     colonoscope ( S#: [id number] ) was introduced through the                     anus and advanced to the the cecum, identified by the                     ileocecal valve. The colonoscopy was performed without                     difficulty. The patient tolerated the procedure well. The                     quality of the bowel preparation was adequate to identify                     polyps. Findings:      The perianal and digital rectal examinations were normal.      A few small and large-mouthed diverticula were found in the sigmoid       colon.      A 3 mm, non-bleeding polyp was found in the transverse colon. The polyp       was sessile. The polyp was removed with a hot snare. Resection and       retrieval were complete.      A  5 mm polyp was found in the descending colon. The polyp was sessile.  The polyp was removed with a hot snare. Resection and retrieval were       complete.      A 3 mm polyp was found in the sigmoid colon. The polyp was sessile. The       polyp was removed with a hot snare. Resection and retrieval were       complete.      A 3 mm polyp was found in the descending colon. The polyp was sessile.       Biopsies were taken with a cold forceps for histology.      The exam was otherwise without abnormality. Impression:        - Diverticulosis in the sigmoid colon.                    - One 3 mm, non-bleeding polyp in the transverse colon.                     Resected and retrieved.                    - One 5 mm polyp in the descending colon. Resected and                     retrieved.                    - One 3 mm polyp in the sigmoid colon. Resected and                     retrieved.                    - One 3 mm polyp in the descending colon. Biopsied.                    - The examination was otherwise normal. Recommendation:    - Await pathology results. Procedure Code(s): --- Professional ---                    [phone], Colonoscopy, flexible; with removal of tumor(s),                     polyp(s), or other lesion(s) by snare technique                    45380, 88, Colonoscopy, flexible; with biopsy, single or                     multiple Diagnosis Code(s): --- Professional ---                    D12.3, Benign neoplasm of transverse colon                    D12.5, Benign neoplasm of sigmoid colon                    D12.4, Benign neoplasm of descending colon                    R19.5, Other fecal abnormalities                    K57.30, Diverticulosis of large intestine without                     perforation or abscess without bleeding CPT copyright 2014  American Medical Association. All rights reserved. The codes documented in this report are preliminary and upon coder review may  be  revised to meet current compliance requirements. Christene Lye, MD 07/11/2015 10:54:42 AM This report has been signed electronically. Number of Addenda: 0 Note Initiated On: 07/11/2015 9:26 AM Scope Withdrawal Time: 0 hours 25 minutes 0 seconds  Total Procedure Duration: 0 hours 43 minutes 30 seconds       [facility]

## 2015-07-11 NOTE — Anesthesia Preprocedure Evaluation (Signed)
Anesthesia Evaluation  Patient identified by MRN, date of birth, ID band Patient awake    Reviewed: Allergy & Precautions, H&P , NPO status , Patient's Chart, lab work & pertinent test results, reviewed documented beta blocker date and time   History of Anesthesia Complications Negative for: history of anesthetic complications  Airway Mallampati: III  TM Distance: >3 FB Neck ROM: full    Dental no notable dental hx. (+) Edentulous Upper, Edentulous Lower   Pulmonary neg shortness of breath, sleep apnea (likely based on history) , COPD,  COPD inhaler, Recent URI , Residual Cough, Current Smoker,    Pulmonary exam normal breath sounds clear to auscultation       Cardiovascular Exercise Tolerance: Good hypertension, (-) angina+ CAD (blockages not bad enough to put a stent in yet), + Past MI (in 2001) and + Peripheral Vascular Disease  (-) Cardiac Stents and (-) CABG negative cardio ROS Normal cardiovascular exam(-) dysrhythmias + Valvular Problems/Murmurs  Rhythm:regular Rate:Normal     Neuro/Psych neg Seizures  Neuromuscular disease (peripheral neuropathy) negative psych ROS   GI/Hepatic GERD  ,Fatty liver disease   Endo/Other  negative endocrine ROSdiabetes, Well Controlled, Insulin Dependent  Renal/GU CRFRenal disease  negative genitourinary   Musculoskeletal   Abdominal   Peds  Hematology  (+) Blood dyscrasia, anemia ,   Anesthesia Other Findings Past Medical History:   Coronary artery disease                                      Diabetes mellitus                                            Hypertension                                                 Arthritis                                                    High cholesterol                                             Renal disorder                                               Chronic neck pain                                            Polycythemia, secondary                          01/10/2015    COPD (chronic obstructive pulmonary disease) Marland Kitchen  Peripheral vascular disease ([place])                            Skin cancer                                     2014           Comment:nose   Fatty liver                                                  Pancreatitis                                                 Diverticulosis                                               Reproductive/Obstetrics negative OB ROS                             Anesthesia Physical Anesthesia Plan  ASA: III  Anesthesia Plan: General   Post-op Pain Management:    Induction:   Airway Management Planned:   Additional Equipment:   Intra-op Plan:   Post-operative Plan:   Informed Consent: I have reviewed the patients History and Physical, chart, labs and discussed the procedure including the risks, benefits and alternatives for the proposed anesthesia with the patient or authorized representative who has indicated his/her understanding and acceptance.   Dental Advisory Given  Plan Discussed with: Anesthesiologist, CRNA and Surgeon  Anesthesia Plan Comments:         Anesthesia Quick Evaluation

## 2015-07-11 NOTE — Anesthesia Procedure Notes (Addendum)
Performed by: Vaughan Sine Pre-anesthesia Checklist: Patient identified, Emergency Drugs available, Suction available, Patient being monitored and Timeout performed Patient Re-evaluated:Patient Re-evaluated prior to inductionOxygen Delivery Method: Nasal cannula Preoxygenation: Pre-oxygenation with 100% oxygen Intubation Type: IV induction Airway Equipment and Method: Bite block Placement Confirmation: CO2 detector and positive ETCO2   Performed by: Vaughan Sine Oxygen Delivery Method: Simple face mask

## 2015-07-11 NOTE — Transfer of Care (Signed)
Immediate Anesthesia Transfer of Care Note  Patient: Colton Carter.  Procedure(s) Performed: Procedure(s): COLONOSCOPY WITH PROPOFOL (N/A) ESOPHAGOGASTRODUODENOSCOPY (EGD) WITH PROPOFOL (N/A)  Patient Location: PACU  Anesthesia Type:General  Level of Consciousness: awake and sedated  Airway & Oxygen Therapy: Patient Spontanous Breathing and Patient connected to face mask oxygen  Post-op Assessment: Report given to RN and Post -op Vital signs reviewed and stable  Post vital signs: Reviewed  Last Vitals:  Filed Vitals:   07/11/15 0900  BP: 133/113  Pulse: 82  Temp: 36.7 C  Resp: 16    Complications: No apparent anesthesia complications

## 2015-07-11 NOTE — Interval H&P Note (Signed)
History and Physical Interval Note:  07/11/2015 9:29 AM  Colton Carter.  has presented today for surgery, with the diagnosis of HEME POSITIVE  The various methods of treatment have been discussed with the patient and family. After consideration of risks, benefits and other options for treatment, the patient has consented to  Procedure(s): COLONOSCOPY WITH PROPOFOL (N/A) ESOPHAGOGASTRODUODENOSCOPY (EGD) WITH PROPOFOL (N/A) as a surgical intervention .  The patient's history has been reviewed, patient examined, no change in status, stable for surgery.  I have reviewed the patient's chart and labs.  Questions were answered to the patient's satisfaction.     SANKAR,SEEPLAPUTHUR G

## 2015-07-11 NOTE — Op Note (Signed)
[facility] Patient Name: Colton Carter Procedure Date: 07/11/2015 9:35 AM MRN: [id number] Account #: [id number] Date of Birth: 14-Oct-1945 Admit Type: Outpatient Age: 69 Room: [facility] ENDO ROOM 1 Gender: Male Note Status: Finalized Procedure:         Upper GI endoscopy Indications:       Dyspepsia, Heme positive stool Providers:         Khyrin Trevathan G. Jamal Collin, MD Referring MD:      Casilda Carls, MD (Referring MD) Medicines:         General Anesthesia Complications:     No immediate complications. Procedure:         Pre-Anesthesia Assessment:                    - General anesthesia under the supervision of an                     anesthesiologist was determined to be medically necessary                     for this procedure based on review of the patient's                     medical history, medications, and prior anesthesia history.                    After obtaining informed consent, the endoscope was passed                     under direct vision. Throughout the procedure, the                     patient's blood pressure, pulse, and oxygen saturations                     were monitored continuously. The Endoscope was introduced                     through the mouth, and advanced to the second part of                     duodenum. The upper GI endoscopy was accomplished without                     difficulty. The patient tolerated the procedure well. Findings:      The examined duodenum was normal.      Patchy moderately erythematous mucosa was found in the prepyloric region       of the stomach. Biopsies were taken with a cold forceps for histology.      Non-severe esophagitis was found 38 cm from the incisors. Biopsies were       taken with a cold forceps for histology.      The exam was otherwise without abnormality. Impression:        - Normal examined duodenum.                    - Erythematous mucosa in the prepyloric region of the             stomach. Biopsied.                    - Non-severe esophagitis. Biopsied.                    -  The examination was otherwise normal. Recommendation:    - Perform a colonoscopy today. Procedure Code(s): --- Professional ---                    [phone], Esophagogastroduodenoscopy, flexible, transoral;                     with biopsy, single or multiple Diagnosis Code(s): --- Professional ---                    K31.89, Other diseases of stomach and duodenum                    K20.9, Esophagitis, unspecified                    K30, Functional dyspepsia                    R19.5, Other fecal abnormalities CPT copyright 2014 American Medical Association. All rights reserved. The codes documented in this report are preliminary and upon coder review may  be revised to meet current compliance requirements. Christene Lye, MD 07/11/2015 9:57:03 AM This report has been signed electronically. Number of Addenda: 0 Note Initiated On: 07/11/2015 9:35 AM      [facility]

## 2015-07-12 LAB — SURGICAL PATHOLOGY

## 2015-07-12 NOTE — Anesthesia Postprocedure Evaluation (Signed)
Anesthesia Post Note  Patient: Colton Carter.  Procedure(s) Performed: Procedure(s) (LRB): COLONOSCOPY WITH PROPOFOL (N/A) ESOPHAGOGASTRODUODENOSCOPY (EGD) WITH PROPOFOL (N/A)  Patient location during evaluation: Endoscopy Anesthesia Type: General Level of consciousness: awake and alert Pain management: pain level controlled Vital Signs Assessment: post-procedure vital signs reviewed and stable Respiratory status: spontaneous breathing, nonlabored ventilation, respiratory function stable and patient connected to nasal cannula oxygen Cardiovascular status: blood pressure returned to baseline and stable Postop Assessment: no signs of nausea or vomiting Anesthetic complications: no    Last Vitals:  Filed Vitals:   07/11/15 1120 07/11/15 1130  BP: 112/86 108/78  Pulse: 82 70  Temp:    Resp: 19 25    Last Pain:  Filed Vitals:   07/12/15 0813  PainSc: 0-No pain                 Martha Clan

## 2015-07-25 ENCOUNTER — Ambulatory Visit: Payer: Medicare Other | Admitting: Anesthesiology

## 2015-08-06 ENCOUNTER — Encounter: Payer: Self-pay | Admitting: Family Medicine

## 2015-08-06 ENCOUNTER — Ambulatory Visit (INDEPENDENT_AMBULATORY_CARE_PROVIDER_SITE_OTHER): Payer: Medicare Other | Admitting: Family Medicine

## 2015-08-06 VITALS — BP 158/91 | HR 98 | Temp 98.2°F | Resp 16 | Ht 67.0 in | Wt 209.6 lb

## 2015-08-06 DIAGNOSIS — D751 Secondary polycythemia: Secondary | ICD-10-CM

## 2015-08-06 DIAGNOSIS — K219 Gastro-esophageal reflux disease without esophagitis: Secondary | ICD-10-CM

## 2015-08-06 DIAGNOSIS — M0579 Rheumatoid arthritis with rheumatoid factor of multiple sites without organ or systems involvement: Secondary | ICD-10-CM

## 2015-08-06 DIAGNOSIS — E1142 Type 2 diabetes mellitus with diabetic polyneuropathy: Secondary | ICD-10-CM

## 2015-08-06 DIAGNOSIS — N183 Chronic kidney disease, stage 3 unspecified: Secondary | ICD-10-CM

## 2015-08-06 DIAGNOSIS — F418 Other specified anxiety disorders: Secondary | ICD-10-CM

## 2015-08-06 DIAGNOSIS — Z794 Long term (current) use of insulin: Secondary | ICD-10-CM

## 2015-08-06 DIAGNOSIS — F32A Depression, unspecified: Secondary | ICD-10-CM

## 2015-08-06 DIAGNOSIS — J432 Centrilobular emphysema: Secondary | ICD-10-CM

## 2015-08-06 DIAGNOSIS — F329 Major depressive disorder, single episode, unspecified: Secondary | ICD-10-CM | POA: Insufficient documentation

## 2015-08-06 DIAGNOSIS — F419 Anxiety disorder, unspecified: Principal | ICD-10-CM | POA: Insufficient documentation

## 2015-08-06 DIAGNOSIS — E7800 Pure hypercholesterolemia, unspecified: Secondary | ICD-10-CM

## 2015-08-06 MED ORDER — ALPRAZOLAM 0.25 MG PO TABS: 0.2500 mg | ORAL_TABLET | Freq: Three times a day (TID) | ORAL | Status: DC

## 2015-08-06 MED ORDER — SERTRALINE HCL 50 MG PO TABS: 50.0000 mg | ORAL_TABLET | Freq: Every day | ORAL | Status: DC

## 2015-08-06 NOTE — Assessment & Plan Note (Signed)
Records requested from previous provider. Continue current regimen.

## 2015-08-06 NOTE — Assessment & Plan Note (Signed)
Managed by hematology. Continue close follow-up with [facility].

## 2015-08-06 NOTE — Assessment & Plan Note (Signed)
Awaiting previous records to determine A1c. Follow up in 2 mos. Consider adjusting sliding scale insulin for better glycemic control. Pt will schedule an eye exam this month. Urine micro next visit.  RTC 2 mos.

## 2015-08-06 NOTE — Assessment & Plan Note (Signed)
Continue inhalers. Spirometry will be needed this year. Will discuss scheduling next visit.

## 2015-08-06 NOTE — Assessment & Plan Note (Signed)
Managed by Rheumatology.
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
## 2015-08-06 NOTE — Assessment & Plan Note (Signed)
Managed by nephrology. 

## 2015-08-06 NOTE — Assessment & Plan Note (Signed)
Continue current regimen

## 2015-08-06 NOTE — Patient Instructions (Signed)
Let's try Zoloft for your depression once daily.  Decrease your Xanax to 3 times daily.  We will check back in about 2 months to see how your diabetes and depression are doing.

## 2015-08-06 NOTE — Assessment & Plan Note (Signed)
Restart Zoloft with goal of weaning Xanax to twice daily. Recheck 2 mos.

## 2015-08-10 ENCOUNTER — Ambulatory Visit: Payer: Medicare Other | Admitting: Sports Medicine

## 2015-08-14 ENCOUNTER — Ambulatory Visit (INDEPENDENT_AMBULATORY_CARE_PROVIDER_SITE_OTHER): Payer: Medicare Other | Admitting: Sports Medicine

## 2015-08-14 ENCOUNTER — Encounter: Payer: Self-pay | Admitting: Sports Medicine

## 2015-08-14 DIAGNOSIS — B351 Tinea unguium: Secondary | ICD-10-CM

## 2015-08-14 DIAGNOSIS — E1142 Type 2 diabetes mellitus with diabetic polyneuropathy: Secondary | ICD-10-CM | POA: Diagnosis not present

## 2015-08-14 DIAGNOSIS — B353 Tinea pedis: Secondary | ICD-10-CM

## 2015-08-14 DIAGNOSIS — M79676 Pain in unspecified toe(s): Secondary | ICD-10-CM

## 2015-08-14 MED ORDER — KETOCONAZOLE 2 % EX CREA: 1.0000 "application " | TOPICAL_CREAM | Freq: Every day | CUTANEOUS | Status: DC

## 2015-08-14 NOTE — Progress Notes (Signed)
Patient ID: Colton Carter., male   DOB: 20-Jan-1946, 70 y.o.   MRN: [id number]  Subjective: Colton Carter. is a 70 y.o. male patient with history of type 2 diabetes who returns to office today complaining of long, painful nails  while ambulating in shoes; unable to trim. Patient states that the glucose reading this morning was 100 mg/dl. Patient denies any new changes in medication or new problems. Patient denies any new cramping, numbness, burning or tingling in the legs.  Patient Active Problem List   Diagnosis Date Noted  . Type 2 diabetes mellitus ([place]) 08/06/2015  . Anxiety and depression 08/06/2015  . Polycythemia, secondary 01/10/2015  . Low testosterone 04/15/2012  . Rheumatoid arthritis ([place]) 04/15/2012  . GERD (gastroesophageal reflux disease) 04/15/2012  . COPD (chronic obstructive pulmonary disease) ([place]) 04/15/2012  . Chronic renal failure 04/14/2012  . Multiple fractures of ribs of right side 04/13/2012  . Fall from ladder 04/13/2012  . Abrasion of left knee 04/13/2012  . DM (diabetes mellitus) ([place]) 04/13/2012  . Acute blood loss anemia 04/13/2012  . High cholesterol   . Chronic neck pain    Current Outpatient Prescriptions on File Prior to Visit  Medication Sig Dispense Refill  . ADVAIR DISKUS 250-50 MCG/DOSE AEPB     . ALPRAZolam (XANAX) 0.25 MG tablet Take 1 tablet (0.25 mg total) by mouth 3 (three) times daily. 90 tablet 1  . aspirin 81 MG tablet Take 81 mg by mouth daily.    Marland Kitchen atenolol (TENORMIN) 25 MG tablet Take 1 tablet by mouth daily.    . baclofen (LIORESAL) 10 MG tablet Take by mouth.    . canagliflozin (INVOKANA) 300 MG TABS tablet Take by mouth.    . Cholecalciferol (VITAMIN D-3) 5000 UNITS TABS Take by mouth 2 (two) times daily.    Scarlette Shorts SURECLICK 50 MG/ML injection     . furosemide (LASIX) 40 MG tablet Take 40 mg by mouth.    . gabapentin (NEURONTIN) 600 MG tablet Take by mouth.    Marland Kitchen HYDROcodone-acetaminophen (NORCO) 10-325 MG tablet Take 1 tablet by  mouth every 6 (six) hours as needed.    . insulin aspart (NOVOLOG) 100 UNIT/ML injection Inject into the skin 3 (three) times daily before meals. Sliding scale three times a day at mealtime    . insulin glargine (LANTUS) 100 UNIT/ML injection Inject 65 Units into the skin at bedtime.     . insulin glargine (LANTUS) 100 UNIT/ML injection Inject 65 Units into the skin once. BEFORE BREAKFAST    . levocetirizine ([name]) 5 MG tablet Take by mouth.    . montelukast (SINGULAIR) 10 MG tablet Take by mouth.    . niacin 500 MG tablet Take 500 mg by mouth at bedtime.    Marland Kitchen NOVOLOG FLEXPEN 100 UNIT/ML FlexPen     . Omega-3 Fatty Acids (FISH OIL) 1000 MG CAPS Take by mouth 2 (two) times daily.    Marland Kitchen omeprazole (PRILOSEC) 20 MG capsule Take by mouth.    . polyethylene glycol (MIRALAX / GLYCOLAX) packet Take 17 g by mouth daily as needed. 24 each 6  . polyethylene glycol powder (GLYCOLAX/MIRALAX) powder 255 grams one bottle for colonoscopy prep 255 g 0  . predniSONE (DELTASONE) 5 MG tablet Take 1 tablet by mouth daily.    Marland Kitchen senna (SENOKOT) 8.6 MG tablet Take 1 tablet by mouth daily.    . sertraline (ZOLOFT) 50 MG tablet Take 1 tablet (50 mg total) by mouth daily. [place]  tablet 3  . simvastatin (ZOCOR) 40 MG tablet Take 1 tablet by mouth daily.    Marland Kitchen ULORIC 80 MG TABS Take 1 tablet by mouth every other day.    . VENTOLIN HFA 108 (90 BASE) MCG/ACT inhaler Inhale 2 puffs into the lungs 2 (two) times daily.    . vitamin B-12 (CYANOCOBALAMIN) 500 MCG tablet Take 1,000 mcg by mouth daily.    . vitamin C (ASCORBIC ACID) 500 MG tablet Take 500 mg by mouth daily.     No current facility-administered medications on file prior to visit.   Allergies  Allergen Reactions  . Levaquin [Levofloxacin In D5w] Other (See Comments)    Kidney shut down  . Milk-Related Compounds Nausea And Vomiting  . Septra Ds [Sulfamethoxazole-Trimethoprim]      Objective: General: Patient is awake, alert, and oriented x 3 and in no acute  distress.  Integument: Skin is warm, dry and supple bilateral. Nails are tender, long, thickened and  dystrophic with subungual debris, consistent with onychomycosis, 1-5 bilateral; nail spicules are noted at both hallux nails from previous nail avulsions. Mild scaly skin plantar aspects of both feet with no signs of infection, improving in nature. No open lesions or preulcerative lesions present bilateral. Remaining integument unremarkable.  Vasculature:  Dorsalis Pedis pulse 2/4 bilateral. Posterior Tibial pulse  2/4 bilateral.  Capillary fill time <3 sec 1-5 bilateral. No hair growth to the level of the digits. Temperature gradient within normal limits. + hyperpigmentation and varicosities present bilateral. No edema present bilateral.   Neurology: The patient has intact sensation measured with a 5.07/10g Semmes Weinstein Monofilament at all pedal sites bilateral . Vibratory sensation diminished bilateral with tuning fork. No Babinski sign present bilateral.   Musculoskeletal: Mild asymptomatic bunion and hammer  deformities noted bilateral. Muscular strength 5/5 in all lower extremity muscular groups bilateral without pain or limitation on range of motion . No tenderness with calf compression bilateral.  Assessment and Plan: Problem List Items Addressed This Visit      Endocrine   DM (diabetes mellitus) ([place]) (Chronic)    Other Visit Diagnoses    Dermatophytosis of nail    -  Primary    Relevant Medications    ketoconazole (NIZORAL) 2 % cream    Tinea pedis of both feet        Relevant Medications    ketoconazole (NIZORAL) 2 % cream    Pain of toe, unspecified laterality           -Examined patient. -Discussed and educated patient on diabetic foot care, especially with  regards to the vascular, neurological and musculoskeletal systems.  -Stressed the importance of good glycemic control and the detriment of not  controlling glucose levels in relation to the foot. -Mechanically  debrided all nails 1-5 bilateral using sterile nail nipper and filed with dremel without incident  -Refilled Ketoconazole cream for tinea  -Gave patient silicone toe cap to protect 5th toes bilateral; recommend good supportive shoes daily -Answered all patient questions -Patient to return in 3 months for at risk foot care -Patient advised to call the office if any problems or questions arise in the meantime.  Landis Martins, DPM

## 2015-08-23 ENCOUNTER — Ambulatory Visit: Payer: Medicare Other | Admitting: Anesthesiology

## 2015-08-28 ENCOUNTER — Other Ambulatory Visit: Payer: Medicare Other

## 2015-08-28 ENCOUNTER — Inpatient Hospital Stay: Payer: Medicare Other | Attending: Internal Medicine

## 2015-08-28 ENCOUNTER — Inpatient Hospital Stay: Payer: Medicare Other

## 2015-08-28 VITALS — BP 108/67 | HR 72 | Temp 97.4°F

## 2015-08-28 DIAGNOSIS — D751 Secondary polycythemia: Secondary | ICD-10-CM | POA: Diagnosis not present

## 2015-08-28 DIAGNOSIS — D45 Polycythemia vera: Secondary | ICD-10-CM

## 2015-08-28 LAB — HEMOGLOBIN: Hemoglobin: 18.3 g/dL — ABNORMAL HIGH (ref 13.0–18.0)

## 2015-08-28 LAB — HEMATOCRIT: HEMATOCRIT: 54.4 % — AB (ref 40.0–52.0)

## 2015-08-31 ENCOUNTER — Other Ambulatory Visit: Payer: Self-pay | Admitting: Family Medicine

## 2015-09-28 ENCOUNTER — Other Ambulatory Visit: Payer: Self-pay | Admitting: Family Medicine

## 2015-09-28 LAB — HM DIABETES EYE EXAM

## 2015-09-28 NOTE — Telephone Encounter (Signed)
Refuse Must call office

## 2015-10-01 ENCOUNTER — Telehealth: Payer: Self-pay | Admitting: *Deleted

## 2015-10-01 DIAGNOSIS — B353 Tinea pedis: Secondary | ICD-10-CM

## 2015-10-01 MED ORDER — LEVOCETIRIZINE DIHYDROCHLORIDE 5 MG PO TABS: 5.0000 mg | ORAL_TABLET | Freq: Every evening | ORAL | Status: DC

## 2015-10-01 NOTE — Telephone Encounter (Signed)
He needs appointment to get Xanax.  OK to fill his Xyzol #30/6 refills._-jh

## 2015-10-01 NOTE — Telephone Encounter (Signed)
Patient requesting refill of Xanax and [name]. He is going out of town on 10/03/15.

## 2015-10-01 NOTE — Telephone Encounter (Signed)
[name] sent please call patient and let him know appt needed.Colton Carter

## 2015-10-02 NOTE — Telephone Encounter (Signed)
Called pt he doesn't have appointment until 04/24 and can't wait that long for xanax please suggest ?

## 2015-10-02 NOTE — Telephone Encounter (Signed)
LMTCB

## 2015-10-02 NOTE — Telephone Encounter (Signed)
Pt will be here tomorrow to pick up Rx and will schedule appointment while he will here.

## 2015-10-02 NOTE — Telephone Encounter (Signed)
Per my note, he was supposed to follow-up in 2 mos (around now).  Can he come in sooner for appt? I can write a few Xanax to cover while he is gone, but he will need to come to the office to pick up the prescription with ID. It would be to cover until he could return from his trip. Thanks! AK

## 2015-10-03 ENCOUNTER — Other Ambulatory Visit: Payer: Self-pay | Admitting: Family Medicine

## 2015-10-03 DIAGNOSIS — F329 Major depressive disorder, single episode, unspecified: Secondary | ICD-10-CM

## 2015-10-03 DIAGNOSIS — F419 Anxiety disorder, unspecified: Principal | ICD-10-CM

## 2015-10-03 MED ORDER — ALPRAZOLAM 0.25 MG PO TABS: 0.2500 mg | ORAL_TABLET | Freq: Three times a day (TID) | ORAL | Status: DC

## 2015-10-04 ENCOUNTER — Ambulatory Visit: Payer: Medicare Other | Admitting: Family Medicine

## 2015-10-11 ENCOUNTER — Encounter: Payer: Self-pay | Admitting: Family Medicine

## 2015-10-11 ENCOUNTER — Ambulatory Visit (INDEPENDENT_AMBULATORY_CARE_PROVIDER_SITE_OTHER): Payer: Medicare Other | Admitting: Family Medicine

## 2015-10-11 DIAGNOSIS — F418 Other specified anxiety disorders: Secondary | ICD-10-CM | POA: Diagnosis not present

## 2015-10-11 DIAGNOSIS — F419 Anxiety disorder, unspecified: Secondary | ICD-10-CM

## 2015-10-11 DIAGNOSIS — Z720 Tobacco use: Secondary | ICD-10-CM | POA: Diagnosis not present

## 2015-10-11 DIAGNOSIS — M542 Cervicalgia: Secondary | ICD-10-CM

## 2015-10-11 DIAGNOSIS — E1142 Type 2 diabetes mellitus with diabetic polyneuropathy: Secondary | ICD-10-CM | POA: Diagnosis not present

## 2015-10-11 DIAGNOSIS — G8929 Other chronic pain: Secondary | ICD-10-CM | POA: Diagnosis not present

## 2015-10-11 DIAGNOSIS — Z794 Long term (current) use of insulin: Secondary | ICD-10-CM | POA: Diagnosis not present

## 2015-10-11 DIAGNOSIS — F329 Major depressive disorder, single episode, unspecified: Secondary | ICD-10-CM

## 2015-10-11 LAB — POCT GLYCOSYLATED HEMOGLOBIN (HGB A1C): HEMOGLOBIN A1C: 7.5

## 2015-10-11 LAB — POCT UA - MICROALBUMIN
ALBUMIN/CREATININE RATIO, URINE, POC: 0
CREATININE, POC: 0 mg/dL
Microalbumin Ur, POC: 0 mg/L

## 2015-10-11 MED ORDER — INSULIN GLARGINE 100 UNIT/ML SOLOSTAR PEN: 65.0000 [IU] | PEN_INJECTOR | Freq: Every day | SUBCUTANEOUS | Status: DC

## 2015-10-11 MED ORDER — OMEPRAZOLE 20 MG PO CPDR: 20.0000 mg | DELAYED_RELEASE_CAPSULE | Freq: Two times a day (BID) | ORAL | Status: AC

## 2015-10-11 MED ORDER — DICLOFENAC SODIUM 1 % TD GEL: 2.0000 g | Freq: Four times a day (QID) | TRANSDERMAL | Status: DC

## 2015-10-11 MED ORDER — INSULIN PEN NEEDLE 31G X 8 MM MISC: 1.0000 | Freq: Three times a day (TID) | Status: DC

## 2015-10-11 MED ORDER — SERTRALINE HCL 100 MG PO TABS: 100.0000 mg | ORAL_TABLET | Freq: Every day | ORAL | Status: DC

## 2015-10-11 MED ORDER — ALPRAZOLAM 0.25 MG PO TABS: 0.2500 mg | ORAL_TABLET | Freq: Three times a day (TID) | ORAL | Status: DC

## 2015-10-11 NOTE — Patient Instructions (Addendum)
Diabetes: Let's increase your sliding scale insulin to: 80-150 6 units each meal 151-200: 10 units 201-300: 12 units 301-350: 14 units 351-400: 16 units >400: 20 units   Please call me if you have sugars greater than 350.  Please don't forget to schedule your eye exam.   Let's go up to 100mg  daily on your zoloft to help with your depressive symptoms.

## 2015-10-11 NOTE — Assessment & Plan Note (Signed)
Encouraged smoking cessation 

## 2015-10-11 NOTE — Assessment & Plan Note (Signed)
Increase zoloft to 100mg . Renew Xanax for panic symptoms. Goal is to use occasionally and not daily.  Recheck depression 1 month.

## 2015-10-11 NOTE — Progress Notes (Signed)
Subjective:    Patient ID: Colton Rainwater., male    DOB: 11-07-1945, 70 y.o.   MRN: [id number]  HPI: Colton Shiner. is a 70 y.o. male presenting on 10/11/2015 for Anxiety   HPI  Pt presents for diabetes follow-up. Last A1c was 8.0%. Down to 7.5%- cut out night snacking, exercising more. Avg AM- range from 61-158.  Pt reports AM sugars are high when he snacks at night. Avg PP under 180. Avg about 6 units with each meal. Never about 10 of novolog. Taking 65 of lantus. Has gabapentin QID for foot pain and tingling.  Eye exam: Has not scheduled. Sees [facility]. Needs to schedule.   Arthritis pain in neck- requesting voltaren gels- uses 3 times per week to help with ankle and neck pain   Anxiety and depression: Decreased Xanax to TID for anxiety- working well. Have restarted zoloft. No major panic attacks. Still anxious as times. Feels zoloft is helping with symptoms but not quite feeling like he should. He would like to try to decrease Xanax use to as needed only. GERD symptoms doing well. Requesting refill on omeprazole today.    Past Medical History  Diagnosis Date  . Coronary artery disease   . Diabetes mellitus   . Hypertension   . Arthritis   . High cholesterol   . Renal disorder   . Chronic neck pain   . Polycythemia, secondary 01/10/2015  . COPD (chronic obstructive pulmonary disease) ([place])   . Peripheral vascular disease ([place])   . Skin cancer 2014    nose  . Fatty liver   . Pancreatitis   . Diverticulosis   . Allergy   . Emphysema of lung ([place])   . Headache   . GERD (gastroesophageal reflux disease)   . Stomach ulcer     in past  . Obesity     Current Outpatient Prescriptions on File Prior to Visit  Medication Sig  . ADVAIR DISKUS 250-50 MCG/DOSE AEPB INHALE ONE PUFF EVERY 12 HOURS  . aspirin 81 MG tablet Take 81 mg by mouth daily.  Marland Kitchen atenolol (TENORMIN) 25 MG tablet Take 1 tablet by mouth daily.  . baclofen (LIORESAL) 10 MG tablet TAKE ONE TABLET BY MOUTH EVERY  EIGHT HOURS  . Cholecalciferol (VITAMIN D-3) 5000 UNITS TABS Take by mouth 2 (two) times daily.  Scarlette Shorts SURECLICK 50 MG/ML injection   . furosemide (LASIX) 40 MG tablet Take 40 mg by mouth.  . gabapentin (NEURONTIN) 600 MG tablet Take by mouth.  Marland Kitchen HYDROcodone-acetaminophen (NORCO) 10-325 MG tablet Take 1 tablet by mouth every 6 (six) hours as needed.  . insulin aspart (NOVOLOG) 100 UNIT/ML injection Inject into the skin 3 (three) times daily before meals. Sliding scale three times a day at mealtime  . INVOKANA 300 MG TABS tablet TAKE ONE (1) TABLET EACH DAY  . ketoconazole (NIZORAL) 2 % cream Apply 1 application topically daily.  Marland Kitchen levocetirizine ([name]) 5 MG tablet Take 1 tablet (5 mg total) by mouth every evening.  . montelukast (SINGULAIR) 10 MG tablet Take by mouth.  . niacin 500 MG tablet Take 500 mg by mouth at bedtime.  Marland Kitchen NOVOLOG FLEXPEN 100 UNIT/ML FlexPen USE AS DIRECTED PER SLIDING SCALE 100-150-5 UNITS, 151-200-7 UNITS, 201- 250-9 UNITS, 251-300-11 UNITS, 301-350- 13 UNITS, 351-400-15 UNITS  . Omega-3 Fatty Acids (FISH OIL) 1000 MG CAPS Take by mouth 2 (two) times daily.  . polyethylene glycol (MIRALAX / GLYCOLAX) packet Take 17 g by mouth daily  as needed.  . polyethylene glycol powder (GLYCOLAX/MIRALAX) powder 255 grams one bottle for colonoscopy prep  . predniSONE (DELTASONE) 5 MG tablet Take 1 tablet by mouth daily.  Marland Kitchen senna (SENOKOT) 8.6 MG tablet Take 1 tablet by mouth daily.  . simvastatin (ZOCOR) 40 MG tablet TAKE ONE (1) TABLET BY MOUTH EVERY DAY  . ULORIC 80 MG TABS Take 1 tablet by mouth every other day.  . VENTOLIN HFA 108 (90 Base) MCG/ACT inhaler INHALE TWO PUFFS EVERY 4-6 HOURS  . vitamin B-12 (CYANOCOBALAMIN) 500 MCG tablet Take 1,000 mcg by mouth daily.  . vitamin C (ASCORBIC ACID) 500 MG tablet Take 500 mg by mouth daily.   No current facility-administered medications on file prior to visit.    Review of Systems  Constitutional: Negative for fever and chills.   HENT: Negative.   Eyes: Negative for visual disturbance.  Respiratory: Negative for chest tightness, shortness of breath and wheezing.   Cardiovascular: Negative for chest pain, palpitations and leg swelling.  Gastrointestinal: Negative for nausea, vomiting and abdominal pain.  Endocrine: Negative.  Negative for polydipsia, polyphagia and polyuria.  Genitourinary: Negative for dysuria, urgency, discharge, penile pain and testicular pain.  Musculoskeletal: Positive for back pain (chronic) and neck pain (chronic). Negative for joint swelling and arthralgias.  Skin: Negative.   Neurological: Negative for dizziness, weakness, numbness and headaches.  Psychiatric/Behavioral: Positive for dysphoric mood. Negative for sleep disturbance. The patient is nervous/anxious.    Per HPI unless specifically indicated above     Objective:    BP 132/74 mmHg  Pulse 67  Temp(Src) 98 F (36.7 C) (Oral)  Resp 16  Ht 5\' 7"  (1.702 m)  Wt 203 lb 12.8 oz (92.443 kg)  BMI 31.91 kg/m2  Wt Readings from Last 3 Encounters:  10/11/15 203 lb 12.8 oz (92.443 kg)  08/06/15 209 lb 9.6 oz (95.074 kg)  07/11/15 204 lb (92.534 kg)    Depression screen [facility] 2/9 10/11/2015 08/06/2015 08/06/2015  Decreased Interest 2 2 0  Down, Depressed, Hopeless 1 1 0  PHQ - 2 Score 3 3 0  Altered sleeping 1 2 -  Tired, decreased energy 1 1 -  Change in appetite 0 0 -  Feeling bad or failure about yourself  - 1 -  Trouble concentrating 1 0 -  Moving slowly or fidgety/restless 0 1 -  Suicidal thoughts 0 0 -  PHQ-9 Score 6 8 -  Difficult doing work/chores - Somewhat difficult -     Physical Exam  Constitutional: He is oriented to person, place, and time. He appears well-developed and well-nourished. No distress.  HENT:  Head: Normocephalic and atraumatic.  Neck: Neck supple. No thyromegaly present.  Cardiovascular: Normal rate, regular rhythm and normal heart sounds.  Exam reveals no gallop and no friction rub.   No murmur  heard. Pulmonary/Chest: Effort normal and breath sounds normal. He has no wheezes.  Abdominal: Soft. Bowel sounds are normal. He exhibits no distension. There is no tenderness. There is no rebound.  Musculoskeletal: Normal range of motion. He exhibits no edema or tenderness.  Neurological: He is alert and oriented to person, place, and time. He has normal reflexes.  Skin: Skin is warm and dry. No rash noted. No erythema.  Psychiatric: He has a normal mood and affect. His speech is normal and behavior is normal. Judgment and thought content normal. Cognition and memory are normal.   Results for orders placed or performed in visit on 10/11/15  POCT HgB A1C  Result  Value Ref Range   Hemoglobin A1C 7.5   POCT UA - Microalbumin  Result Value Ref Range   Microalbumin Ur, POC 0 mg/L   Creatinine, POC 0 mg/dL   Albumin/Creatinine Ratio, Urine, POC 0       Assessment & Plan:   Problem List Items Addressed This Visit      Endocrine   Type 2 diabetes mellitus (HCC)    Increase sliding scale mealtime insulin to make improvement in A1c. 6 units up to 150, 10 units up to 200. Pt will hold for sugars less than 80. Call with elevated sugars.  Encouraged continued diet and lifestyle.  Unclear why not on an ACE. Consider adding next visit.  Pt to schedule eye exam. Foot exam done.  Recheck 3 mos.       Relevant Medications   Insulin Glargine (LANTUS) 100 UNIT/ML Solostar Pen   Other Relevant Orders   POCT HgB A1C (Completed)   POCT UA - Microalbumin (Completed)     Other   Chronic neck pain    Managed by [facility] but renewed voltaren gel to help with breakthrough symptoms.       Relevant Medications   diclofenac sodium (VOLTAREN) 1 % GEL   sertraline (ZOLOFT) 100 MG tablet   Anxiety and depression    Increase zoloft to 100mg . Renew Xanax for panic symptoms. Goal is to use occasionally and not daily.  Recheck depression 1 month.       Relevant Medications   sertraline (ZOLOFT) 100 MG  tablet   ALPRAZolam (XANAX) 0.25 MG tablet   Current tobacco use    Encouraged smoking cessation.          Meds ordered this encounter  Medications  . diclofenac sodium (VOLTAREN) 1 % GEL    Sig: Apply 2 g topically 4 (four) times daily.    Dispense:  100 g    Refill:  5    Order Specific Question:  Supervising Provider    Answer:  Arlis Porta [id number]  . sertraline (ZOLOFT) 100 MG tablet    Sig: Take 1 tablet (100 mg total) by mouth daily.    Dispense:  90 tablet    Refill:  3    Order Specific Question:  Supervising Provider    Answer:  Arlis Porta [phone]  . ALPRAZolam (XANAX) 0.25 MG tablet    Sig: Take 1 tablet (0.25 mg total) by mouth 3 (three) times daily.    Dispense:  90 tablet    Refill:  2    Order Specific Question:  Supervising Provider    Answer:  Arlis Porta [phone]  . Insulin Glargine (LANTUS) 100 UNIT/ML Solostar Pen    Sig: Inject 65 Units into the skin daily at 10 pm.    Dispense:  45 mL    Refill:  3    Order Specific Question:  Supervising Provider    Answer:  Arlis Porta [phone]  . Insulin Pen Needle 31G X 8 MM MISC    Sig: 1 each by Does not apply route 3 (three) times daily.    Dispense:  100 each    Refill:  11    Order Specific Question:  Supervising Provider    Answer:  Arlis Porta [phone]  . omeprazole (PRILOSEC) 20 MG capsule    Sig: Take 1 capsule (20 mg total) by mouth 2 (two) times daily before a meal.    Dispense:  60 capsule  Refill:  11    Order Specific Question:  Supervising Provider    Answer:  Arlis Porta [id number]      Follow up plan: Return in about 4 weeks (around 11/08/2015) for depression. Marland Kitchen

## 2015-10-11 NOTE — Assessment & Plan Note (Signed)
Increase sliding scale mealtime insulin to make improvement in A1c. 6 units up to 150, 10 units up to 200. Pt will hold for sugars less than 80. Call with elevated sugars.  Encouraged continued diet and lifestyle.  Unclear why not on an ACE. Consider adding next visit.  Pt to schedule eye exam. Foot exam done.  Recheck 3 mos.

## 2015-10-11 NOTE — Assessment & Plan Note (Signed)
Managed by [facility] but renewed voltaren gel to help with breakthrough symptoms.

## 2015-10-26 ENCOUNTER — Inpatient Hospital Stay (HOSPITAL_BASED_OUTPATIENT_CLINIC_OR_DEPARTMENT_OTHER): Payer: Medicare Other | Admitting: Internal Medicine

## 2015-10-26 ENCOUNTER — Inpatient Hospital Stay: Payer: Medicare Other | Attending: Internal Medicine

## 2015-10-26 ENCOUNTER — Inpatient Hospital Stay: Payer: Medicare Other

## 2015-10-26 VITALS — BP 129/80 | HR 54

## 2015-10-26 VITALS — BP 135/68 | HR 62 | Temp 97.8°F | Resp 18 | Wt 205.5 lb

## 2015-10-26 DIAGNOSIS — D751 Secondary polycythemia: Secondary | ICD-10-CM | POA: Insufficient documentation

## 2015-10-26 DIAGNOSIS — R918 Other nonspecific abnormal finding of lung field: Secondary | ICD-10-CM | POA: Diagnosis not present

## 2015-10-26 DIAGNOSIS — E669 Obesity, unspecified: Secondary | ICD-10-CM | POA: Insufficient documentation

## 2015-10-26 DIAGNOSIS — E119 Type 2 diabetes mellitus without complications: Secondary | ICD-10-CM | POA: Diagnosis not present

## 2015-10-26 DIAGNOSIS — Z800 Family history of malignant neoplasm of digestive organs: Secondary | ICD-10-CM | POA: Insufficient documentation

## 2015-10-26 DIAGNOSIS — F17210 Nicotine dependence, cigarettes, uncomplicated: Secondary | ICD-10-CM | POA: Insufficient documentation

## 2015-10-26 DIAGNOSIS — K219 Gastro-esophageal reflux disease without esophagitis: Secondary | ICD-10-CM | POA: Insufficient documentation

## 2015-10-26 DIAGNOSIS — Z7982 Long term (current) use of aspirin: Secondary | ICD-10-CM | POA: Diagnosis not present

## 2015-10-26 DIAGNOSIS — Z8719 Personal history of other diseases of the digestive system: Secondary | ICD-10-CM | POA: Diagnosis not present

## 2015-10-26 DIAGNOSIS — N289 Disorder of kidney and ureter, unspecified: Secondary | ICD-10-CM

## 2015-10-26 DIAGNOSIS — M129 Arthropathy, unspecified: Secondary | ICD-10-CM | POA: Diagnosis not present

## 2015-10-26 DIAGNOSIS — I10 Essential (primary) hypertension: Secondary | ICD-10-CM | POA: Diagnosis not present

## 2015-10-26 DIAGNOSIS — Z79899 Other long term (current) drug therapy: Secondary | ICD-10-CM | POA: Diagnosis not present

## 2015-10-26 DIAGNOSIS — I2510 Atherosclerotic heart disease of native coronary artery without angina pectoris: Secondary | ICD-10-CM

## 2015-10-26 DIAGNOSIS — Z794 Long term (current) use of insulin: Secondary | ICD-10-CM | POA: Insufficient documentation

## 2015-10-26 DIAGNOSIS — I739 Peripheral vascular disease, unspecified: Secondary | ICD-10-CM | POA: Diagnosis not present

## 2015-10-26 DIAGNOSIS — D45 Polycythemia vera: Secondary | ICD-10-CM

## 2015-10-26 DIAGNOSIS — M542 Cervicalgia: Secondary | ICD-10-CM | POA: Insufficient documentation

## 2015-10-26 DIAGNOSIS — E7800 Pure hypercholesterolemia, unspecified: Secondary | ICD-10-CM

## 2015-10-26 DIAGNOSIS — Z8711 Personal history of peptic ulcer disease: Secondary | ICD-10-CM | POA: Insufficient documentation

## 2015-10-26 DIAGNOSIS — Z7901 Long term (current) use of anticoagulants: Secondary | ICD-10-CM | POA: Diagnosis not present

## 2015-10-26 DIAGNOSIS — J449 Chronic obstructive pulmonary disease, unspecified: Secondary | ICD-10-CM | POA: Insufficient documentation

## 2015-10-26 DIAGNOSIS — Z85828 Personal history of other malignant neoplasm of skin: Secondary | ICD-10-CM

## 2015-10-26 LAB — HEMOGLOBIN: Hemoglobin: 16.6 g/dL (ref 13.0–18.0)

## 2015-10-26 LAB — HEMATOCRIT: HCT: 48.8 % (ref 40.0–52.0)

## 2015-10-26 NOTE — Patient Instructions (Signed)
Smoking Cessation, Tips for Success If you are ready to quit smoking, congratulations! You have chosen to help yourself be healthier. Cigarettes bring nicotine, tar, carbon monoxide, and other irritants into your body. Your lungs, heart, and blood vessels will be able to work better without these poisons. There are many different ways to quit smoking. Nicotine gum, nicotine patches, a nicotine inhaler, or nicotine nasal spray can help with physical craving. Hypnosis, support groups, and medicines help break the habit of smoking. WHAT THINGS CAN I DO TO MAKE QUITTING EASIER?  Here are some tips to help you quit for good:  Pick a date when you will quit smoking completely. Tell all of your friends and family about your plan to quit on that date.  Do not try to slowly cut down on the number of cigarettes you are smoking. Pick a quit date and quit smoking completely starting on that day.  Throw away all cigarettes.   Clean and remove all ashtrays from your home, work, and car.  On a card, write down your reasons for quitting. Carry the card with you and read it when you get the urge to smoke.  Cleanse your body of nicotine. Drink enough water and fluids to keep your urine clear or pale yellow. Do this after quitting to flush the nicotine from your body.  Learn to predict your moods. Do not let a bad situation be your excuse to have a cigarette. Some situations in your life might tempt you into wanting a cigarette.  Never have "just one" cigarette. It leads to wanting another and another. Remind yourself of your decision to quit.  Change habits associated with smoking. If you smoked while driving or when feeling stressed, try other activities to replace smoking. Stand up when drinking your coffee. Brush your teeth after eating. Sit in a different chair when you read the paper. Avoid alcohol while trying to quit, and try to drink fewer caffeinated beverages. Alcohol and caffeine may urge you to  smoke.  Avoid foods and drinks that can trigger a desire to smoke, such as sugary or spicy foods and alcohol.  Ask people who smoke not to smoke around you.  Have something planned to do right after eating or having a cup of coffee. For example, plan to take a walk or exercise.  Try a relaxation exercise to calm you down and decrease your stress. Remember, you may be tense and nervous for the first 2 weeks after you quit, but this will pass.  Find new activities to keep your hands busy. Play with a pen, coin, or rubber band. Doodle or draw things on paper.  Brush your teeth right after eating. This will help cut down on the craving for the taste of tobacco after meals. You can also try mouthwash.   Use oral substitutes in place of cigarettes. Try using lemon drops, carrots, cinnamon sticks, or chewing gum. Keep them handy so they are available when you have the urge to smoke.  When you have the urge to smoke, try deep breathing.  Designate your home as a nonsmoking area.  If you are a heavy smoker, ask your health care provider about a prescription for nicotine chewing gum. It can ease your withdrawal from nicotine.  Reward yourself. Set aside the cigarette money you save and buy yourself something nice.  Look for support from others. Join a support group or smoking cessation program. Ask someone at home or at work to help you with your plan   to quit smoking.  Always ask yourself, "Do I need this cigarette or is this just a reflex?" Tell yourself, "Today, I choose not to smoke," or "I do not want to smoke." You are reminding yourself of your decision to quit.  Do not replace cigarette smoking with electronic cigarettes (commonly called e-cigarettes). The safety of e-cigarettes is unknown, and some may contain harmful chemicals.  If you relapse, do not give up! Plan ahead and think about what you will do the next time you get the urge to smoke. HOW WILL I FEEL WHEN I QUIT SMOKING? You  may have symptoms of withdrawal because your body is used to nicotine (the addictive substance in cigarettes). You may crave cigarettes, be irritable, feel very hungry, cough often, get headaches, or have difficulty concentrating. The withdrawal symptoms are only temporary. They are strongest when you first quit but will go away within 10-14 days. When withdrawal symptoms occur, stay in control. Think about your reasons for quitting. Remind yourself that these are signs that your body is healing and getting used to being without cigarettes. Remember that withdrawal symptoms are easier to treat than the major diseases that smoking can cause.  Even after the withdrawal is over, expect periodic urges to smoke. However, these cravings are generally short lived and will go away whether you smoke or not. Do not smoke! WHAT RESOURCES ARE AVAILABLE TO HELP ME QUIT SMOKING? Your health care provider can direct you to community resources or hospitals for support, which may include:  Group support.  Education.  Hypnosis.  Therapy.   This information is not intended to replace advice given to you by your health care provider. Make sure you discuss any questions you have with your health care provider.   Document Released: 03/28/2004 Document Revised: 07/21/2014 Document Reviewed: 12/16/2012 Elsevier Interactive Patient Education 2016 [employer]  

## 2015-10-26 NOTE — Progress Notes (Signed)
Patient ambulates without assistance.  Patient brought to exam room 5, vitals documented, medication record updated, information provided by patient.

## 2015-10-26 NOTE — Progress Notes (Signed)
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   # April 2016- LUNG NODULES [Lung screening program]  INTERVAL HISTORY:   A pleasant 70 year old Caucasian male patient with a history of smoking for his history of erythrocytosis. Patient notes improvement of fatigue and headaches- post phlebotomy. However many weeks later he starts to have the same symptoms of headache and fatigue. He feels this is related to his elevated hemoglobin.    REVIEW OF SYSTEMS: is no chest pain or shortness of breath. Patient's chronic arthritis. No blood clots. No strokes.  PAST MEDICAL HISTORY :  Past Medical History  Diagnosis Date  . Coronary artery disease   . Diabetes mellitus   . Hypertension   . Arthritis   . High cholesterol   . Renal disorder   . Chronic neck pain   . Polycythemia, secondary 01/10/2015  . COPD (chronic obstructive pulmonary disease) ([place])   . Peripheral vascular disease ([place])   . Skin cancer 2014    nose  . Fatty liver   . Pancreatitis   . Diverticulosis   . Allergy   . Emphysema of lung ([place])   . Headache   . GERD (gastroesophageal reflux disease)   . Stomach ulcer     in past  . Obesity     PAST SURGICAL HISTORY :   Past Surgical History  Procedure Laterality Date  . Cervical fusion    . Peripheral vascular catheterization N/A 02/06/2015    Procedure: Abdominal Aortogram w/Lower Extremity;  Surgeon: Katha Cabal, MD;  Location: [facility];  Service: Cardiovascular;  Laterality: N/A;  . Peripheral vascular catheterization  02/06/2015    Procedure: Lower Extremity Intervention;  Surgeon: Katha Cabal, MD;  Location: [facility];   Service: Cardiovascular;;  . Peripheral vascular catheterization Left 03/20/2015    Procedure: Lower Extremity Angiography;  Surgeon: Katha Cabal, MD;  Location: [facility];  Service: Cardiovascular;  Laterality: Left;  . Peripheral vascular catheterization  03/20/2015    Procedure: Lower Extremity Intervention;  Surgeon: Katha Cabal, MD;  Location: [facility];  Service: Cardiovascular;;  . Colonoscopy  2011    Dr [name]  . Skin cancer excision  2014  . Upper gi endoscopy    . Colonoscopy with propofol N/A 07/11/2015    Procedure: COLONOSCOPY WITH PROPOFOL;  Surgeon: Christene Lye, MD;  Location: [facility];  Service: Endoscopy;  Laterality: N/A;  . Esophagogastroduodenoscopy (egd) with propofol N/A 07/11/2015    Procedure: ESOPHAGOGASTRODUODENOSCOPY (EGD) WITH PROPOFOL;  Surgeon: Christene Lye, MD;  Location: [facility];  Service: Endoscopy;  Laterality: N/A;  . Appendectomy  1980  . Cholecystectomy  2006    FAMILY HISTORY :   Family History  Problem Relation Age of Onset  . Pancreatic cancer Father   . Cancer Father   . Stroke Father   . Heart disease Mother   . Diabetes Mother   . Depression Mother     SOCIAL HISTORY:   Social History  Substance Use Topics  . Smoking status: Current Every Day Smoker -- 0.25 packs/day for 50 years    Types: Cigarettes  . Smokeless tobacco: Never Used  Comment: 5 cigarettes a day  . Alcohol Use: No    ALLERGIES:  is allergic to levaquin; milk-related compounds; and septra ds.  MEDICATIONS:  Current Outpatient Prescriptions  Medication Sig Dispense Refill  . ADVAIR DISKUS 250-50 MCG/DOSE AEPB INHALE ONE PUFF EVERY 12 HOURS 60 each 11  . ALPRAZolam (XANAX) 0.25 MG tablet Take 1 tablet (0.25 mg total) by mouth 3 (three) times daily. 90 tablet 2  . aspirin 81 MG tablet Take 81 mg by mouth daily.    Marland Kitchen atenolol (TENORMIN) 25 MG tablet Take 1 tablet by mouth daily.    . baclofen  (LIORESAL) 10 MG tablet TAKE ONE TABLET BY MOUTH EVERY EIGHT HOURS 90 each 3  . Cholecalciferol (VITAMIN D-3) 5000 UNITS TABS Take by mouth 2 (two) times daily.    . diclofenac sodium (VOLTAREN) 1 % GEL Apply 2 g topically 4 (four) times daily. 100 g 5  . ENBREL SURECLICK 50 MG/ML injection     . furosemide (LASIX) 40 MG tablet Take 40 mg by mouth.    . gabapentin (NEURONTIN) 600 MG tablet Take by mouth.    Marland Kitchen HYDROcodone-acetaminophen (NORCO) 10-325 MG tablet Take 1 tablet by mouth every 6 (six) hours as needed.    . insulin aspart (NOVOLOG) 100 UNIT/ML injection Inject into the skin 3 (three) times daily before meals. Sliding scale three times a day at mealtime    . Insulin Glargine (LANTUS) 100 UNIT/ML Solostar Pen Inject 65 Units into the skin daily at 10 pm. 45 mL 3  . Insulin Pen Needle 31G X 8 MM MISC 1 each by Does not apply route 3 (three) times daily. 100 each 11  . INVOKANA 300 MG TABS tablet TAKE ONE (1) TABLET EACH DAY 90 tablet 3  . ketoconazole (NIZORAL) 2 % cream Apply 1 application topically daily. 60 g 2  . levocetirizine ([name]) 5 MG tablet Take 1 tablet (5 mg total) by mouth every evening. 30 tablet 6  . montelukast (SINGULAIR) 10 MG tablet Take by mouth.    . niacin 500 MG tablet Take 500 mg by mouth at bedtime.    Marland Kitchen NOVOLOG FLEXPEN 100 UNIT/ML FlexPen USE AS DIRECTED PER SLIDING SCALE 100-150-5 UNITS, 151-200-7 UNITS, 201- 250-9 UNITS, 251-300-11 UNITS, 301-350- 13 UNITS, 351-400-15 UNITS 15 mL 11  . omeprazole (PRILOSEC) 20 MG capsule Take 1 capsule (20 mg total) by mouth 2 (two) times daily before a meal. 60 capsule 11  . polyethylene glycol powder (GLYCOLAX/MIRALAX) powder 255 grams one bottle for colonoscopy prep 255 g 0  . predniSONE (DELTASONE) 5 MG tablet Take 1 tablet by mouth daily.    Marland Kitchen senna (SENOKOT) 8.6 MG tablet Take 1 tablet by mouth daily.    . sertraline (ZOLOFT) 100 MG tablet Take 1 tablet (100 mg total) by mouth daily. 90 tablet 3  . simvastatin (ZOCOR) 40  MG tablet TAKE ONE (1) TABLET BY MOUTH EVERY DAY 90 tablet 3  . ULORIC 80 MG TABS Take 1 tablet by mouth every other day.    . VENTOLIN HFA 108 (90 Base) MCG/ACT inhaler INHALE TWO PUFFS EVERY 4-6 HOURS 18 g 3  . vitamin B-12 (CYANOCOBALAMIN) 500 MCG tablet Take 1,000 mcg by mouth daily.    . vitamin C (ASCORBIC ACID) 500 MG tablet Take 500 mg by mouth daily.    . Omega-3 Fatty Acids (FISH OIL) 1000 MG CAPS Take by mouth 2 (two) times daily. Reported on 10/26/2015    . polyethylene glycol (MIRALAX /  GLYCOLAX) packet Take 17 g by mouth daily as needed. (Patient not taking: Reported on 10/26/2015) 24 each 6  . sertraline (ZOLOFT) 50 MG tablet 50 mg.     No current facility-administered medications for this visit.    PHYSICAL EXAMINATION:  BP 135/68 mmHg  Pulse 62  Temp(Src) 97.8 F (36.6 C) (Tympanic)  Wt 205 lb 7.5 oz (93.2 kg)  SpO2 100%  Filed Weights   10/26/15 0949  Weight: 205 lb 7.5 oz (93.2 kg)    GENERAL: Well-nourished well-developed; Alert, no distress and comfortable.  Alone.  EYES: no pallor or icterus OROPHARYNX: no thrush or ulceration NECK: supple, no masses felt LYMPH:  no palpable lymphadenopathy in the cervical, axillary or inguinal regions LUNGS: decreased breath sounds bilaterally. No wheeze or crackles HEART/CVS: regular rate & rhythm and no murmurs; No lower extremity edema ABDOMEN:abdomen soft, non-tender and normal bowel sounds Musculoskeletal:no cyanosis of digits and no clubbing  PSYCH: alert & oriented x 3 with fluent speech NEURO: no focal motor/sensory deficits SKIN:  no rashes or significant lesions  LABORATORY DATA:  I have reviewed the data as listed    Component Value Date/Time   NA 144 03/20/2015 0739   NA 140 03/24/2014 1308   K 3.6 03/20/2015 0739   K 3.6 03/24/2014 1308   CL 108 03/20/2015 0739   CL 105 03/24/2014 1308   CO2 29 03/20/2015 0739   CO2 28 03/24/2014 1308   GLUCOSE 109* 03/20/2015 0739   GLUCOSE 155* 03/24/2014 1308    BUN 26* 03/20/2015 0739   BUN 17 03/24/2014 1308   CREATININE 1.30* 03/20/2015 0739   CREATININE 1.33* 03/24/2014 1308   CALCIUM 8.9 03/20/2015 0739   CALCIUM 8.1* 03/24/2014 1308   PROT 6.4 03/24/2014 1308   PROT 6.3 09/02/2007 1256   ALBUMIN 3.0* 03/24/2014 1308   ALBUMIN 3.1* 04/18/2012 0435   AST 34 03/24/2014 1308   AST 34 09/02/2007 1256   ALT 34 03/24/2014 1308   ALT 35 09/02/2007 1256   ALKPHOS 104 03/24/2014 1308   ALKPHOS 36* 09/02/2007 1256   BILITOT 0.4 03/24/2014 1308   BILITOT 0.8 09/02/2007 1256   GFRNONAA 55* 03/20/2015 0739   GFRNONAA 55* 03/24/2014 1308   GFRNONAA 36* 07/15/2011 0528   GFRAA >60 03/20/2015 0739   GFRAA >60 03/24/2014 1308   GFRAA 44* 07/15/2011 0528    No results found for: SPEP, UPEP  Lab Results  Component Value Date   WBC 8.9 06/27/2015   NEUTROABS 4.9 06/27/2015   HGB 16.6 10/26/2015   HCT 48.8 10/26/2015   MCV 91.4 06/27/2015   PLT 121* 06/27/2015      Chemistry      Component Value Date/Time   NA 144 03/20/2015 0739   NA 140 03/24/2014 1308   K 3.6 03/20/2015 0739   K 3.6 03/24/2014 1308   CL 108 03/20/2015 0739   CL 105 03/24/2014 1308   CO2 29 03/20/2015 0739   CO2 28 03/24/2014 1308   BUN 26* 03/20/2015 0739   BUN 17 03/24/2014 1308   CREATININE 1.30* 03/20/2015 0739   CREATININE 1.33* 03/24/2014 1308      Component Value Date/Time   CALCIUM 8.9 03/20/2015 0739   CALCIUM 8.1* 03/24/2014 1308   ALKPHOS 104 03/24/2014 1308   ALKPHOS 36* 09/02/2007 1256   AST 34 03/24/2014 1308   AST 34 09/02/2007 1256   ALT 34 03/24/2014 1308   ALT 35 09/02/2007 1256   BILITOT 0.4 03/24/2014 1308  BILITOT 0.8 09/02/2007 1256        ASSESSMENT & PLAN:   # 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 Patient symptomatically feels better post phlebotomy. I recommend phlebotomy if hematocrit is greater than 46.  # Labs H&H every 2 months possible phlebotomy; follow-up with me in 6 months.     Cammie Sickle, MD 10/26/2015 10:08 AM

## 2015-10-31 ENCOUNTER — Other Ambulatory Visit: Payer: Self-pay | Admitting: Family Medicine

## 2015-11-02 ENCOUNTER — Telehealth: Payer: Self-pay | Admitting: Family Medicine

## 2015-11-02 NOTE — Telephone Encounter (Signed)
Please let him know there are a variety of reasons the Xanax did not show up in his system.  The provider who performed the screen may not ordered a sensitive test for that drug. If he is not taking it on a regular basis it may not show up in urine depending on his last dose. I would like to have a copy of the drug screen. Which office ordered the test? Thanks! AK

## 2015-11-02 NOTE — Telephone Encounter (Signed)
Pain states [facility] performs test themselves. Patient does not have a copy.

## 2015-11-02 NOTE — Telephone Encounter (Signed)
Pt . Called states that he had a urine test and xanax 0.25 mg did not show up wanted to know why (this have happen before). Pt have requested that you call him  [phone]

## 2015-11-02 NOTE — Telephone Encounter (Signed)
Attempted to call patient and he was not home. Told family member to relay message we were returning his call. Reminder patient has appointment on Monday.

## 2015-11-05 ENCOUNTER — Encounter: Payer: Self-pay | Admitting: Family Medicine

## 2015-11-05 ENCOUNTER — Ambulatory Visit (INDEPENDENT_AMBULATORY_CARE_PROVIDER_SITE_OTHER): Payer: Medicare Other | Admitting: Family Medicine

## 2015-11-05 ENCOUNTER — Ambulatory Visit
Admission: RE | Admit: 2015-11-05 | Discharge: 2015-11-05 | Disposition: A | Discharge status: Home / Self Care | Payer: Medicare Other | Source: Ambulatory Visit | Attending: Family Medicine | Admitting: Family Medicine

## 2015-11-05 VITALS — BP 127/67 | HR 86 | Temp 98.3°F | Resp 16 | Ht 67.0 in | Wt 204.0 lb

## 2015-11-05 DIAGNOSIS — R0789 Other chest pain: Secondary | ICD-10-CM | POA: Diagnosis not present

## 2015-11-05 DIAGNOSIS — J441 Chronic obstructive pulmonary disease with (acute) exacerbation: Secondary | ICD-10-CM

## 2015-11-05 DIAGNOSIS — R509 Fever, unspecified: Secondary | ICD-10-CM | POA: Insufficient documentation

## 2015-11-05 DIAGNOSIS — J432 Centrilobular emphysema: Secondary | ICD-10-CM

## 2015-11-05 DIAGNOSIS — R0602 Shortness of breath: Secondary | ICD-10-CM | POA: Diagnosis present

## 2015-11-05 LAB — POCT INFLUENZA A/B
INFLUENZA A, POC: NEGATIVE
INFLUENZA B, POC: NEGATIVE

## 2015-11-05 MED ORDER — AZITHROMYCIN 250 MG PO TABS: ORAL_TABLET | ORAL | Status: DC

## 2015-11-05 MED ORDER — PREDNISONE 20 MG PO TABS: 40.0000 mg | ORAL_TABLET | Freq: Every day | ORAL | Status: DC

## 2015-11-05 MED ORDER — ALBUTEROL SULFATE (2.5 MG/3ML) 0.083% IN NEBU: 2.5000 mg | INHALATION_SOLUTION | Freq: Four times a day (QID) | RESPIRATORY_TRACT | Status: DC | PRN

## 2015-11-05 MED ORDER — IPRATROPIUM-ALBUTEROL 0.5-2.5 (3) MG/3ML IN SOLN: 3.0000 mL | Freq: Four times a day (QID) | RESPIRATORY_TRACT | Status: AC

## 2015-11-05 NOTE — Progress Notes (Signed)
Subjective:    Patient ID: Colton Rainwater., male    DOB: 22-Jul-1945, 70 y.o.   MRN: [id number]  HPI: Colton Carter. is a 70 y.o. male presenting on 11/05/2015 for Shortness of Breath   HPI  Pt presents for depression follow-up but is reporting shortness of breath. Feels like he has a rock on chest when he takes a deep breath- cannot get a deep breath in. Is trying to cough up sputums but is difficulty. Cough began on Friday night. Started on Friday. Got worse on Saturday night. Cough is productive of sputum.  Awake coughing and nose runny on Saturday night. Also reporting diarrhea. No vomiting. Has reported wheezing at home. No fevers. Has felt hot. Is using ventolin 2-3 times per day.  Home treatment is robitussin and cold/flu. No relief.   Past Medical History  Diagnosis Date  . Coronary artery disease   . Diabetes mellitus   . Hypertension   . Arthritis   . High cholesterol   . Renal disorder   . Chronic neck pain   . Polycythemia, secondary 01/10/2015  . COPD (chronic obstructive pulmonary disease) ([place])   . Peripheral vascular disease ([place])   . Skin cancer 2014    nose  . Fatty liver   . Pancreatitis   . Diverticulosis   . Allergy   . Emphysema of lung ([place])   . Headache   . GERD (gastroesophageal reflux disease)   . Stomach ulcer     in past  . Obesity     Current Outpatient Prescriptions on File Prior to Visit  Medication Sig  . ADVAIR DISKUS 250-50 MCG/DOSE AEPB INHALE ONE PUFF EVERY 12 HOURS  . ALPRAZolam (XANAX) 0.25 MG tablet Take 1 tablet (0.25 mg total) by mouth 3 (three) times daily.  Marland Kitchen aspirin 81 MG tablet Take 81 mg by mouth daily.  Marland Kitchen atenolol (TENORMIN) 25 MG tablet Take 1 tablet by mouth daily.  . baclofen (LIORESAL) 10 MG tablet TAKE ONE TABLET BY MOUTH EVERY EIGHT HOURS  . Cholecalciferol (VITAMIN D-3) 5000 UNITS TABS Take by mouth 2 (two) times daily.  . diclofenac sodium (VOLTAREN) 1 % GEL Apply 2 g topically 4 (four) times daily.  Scarlette Shorts  SURECLICK 50 MG/ML injection   . furosemide (LASIX) 40 MG tablet TAKE ONE (1) TABLET BY MOUTH EVERY DAY  . gabapentin (NEURONTIN) 600 MG tablet TAKE ONE (1) TABLET FOUR (4) TIMES PER DAY  . HYDROcodone-acetaminophen (NORCO) 10-325 MG tablet Take 1 tablet by mouth every 6 (six) hours as needed.  . insulin aspart (NOVOLOG) 100 UNIT/ML injection Inject into the skin 3 (three) times daily before meals. Sliding scale three times a day at mealtime  . Insulin Glargine (LANTUS) 100 UNIT/ML Solostar Pen Inject 65 Units into the skin daily at 10 pm.  . Insulin Pen Needle 31G X 8 MM MISC 1 each by Does not apply route 3 (three) times daily.  . INVOKANA 300 MG TABS tablet TAKE ONE (1) TABLET EACH DAY  . ketoconazole (NIZORAL) 2 % cream Apply 1 application topically daily.  Marland Kitchen levocetirizine ([name]) 5 MG tablet Take 1 tablet (5 mg total) by mouth every evening.  . montelukast (SINGULAIR) 10 MG tablet Take by mouth.  . niacin 500 MG tablet Take 500 mg by mouth at bedtime.  Marland Kitchen NOVOLOG FLEXPEN 100 UNIT/ML FlexPen USE AS DIRECTED PER SLIDING SCALE 100-150-5 UNITS, 151-200-7 UNITS, 201- 250-9 UNITS, 251-300-11 UNITS, 301-350- 13 UNITS, 351-400-15 UNITS  .  Omega-3 Fatty Acids (FISH OIL) 1000 MG CAPS Take by mouth 2 (two) times daily. Reported on 10/26/2015  . omeprazole (PRILOSEC) 20 MG capsule Take 1 capsule (20 mg total) by mouth 2 (two) times daily before a meal.  . polyethylene glycol (MIRALAX / GLYCOLAX) packet Take 17 g by mouth daily as needed.  . polyethylene glycol powder (GLYCOLAX/MIRALAX) powder 255 grams one bottle for colonoscopy prep  . predniSONE (DELTASONE) 5 MG tablet Take 1 tablet by mouth daily.  Marland Kitchen senna (SENOKOT) 8.6 MG tablet Take 1 tablet by mouth daily.  . sertraline (ZOLOFT) 100 MG tablet Take 1 tablet (100 mg total) by mouth daily.  . sertraline (ZOLOFT) 50 MG tablet 50 mg.  . simvastatin (ZOCOR) 40 MG tablet TAKE ONE (1) TABLET BY MOUTH EVERY DAY  . ULORIC 80 MG TABS Take 1 tablet by mouth  every other day.  . VENTOLIN HFA 108 (90 Base) MCG/ACT inhaler INHALE TWO PUFFS EVERY 4-6 HOURS  . vitamin B-12 (CYANOCOBALAMIN) 500 MCG tablet Take 1,000 mcg by mouth daily.  . vitamin C (ASCORBIC ACID) 500 MG tablet Take 500 mg by mouth daily.   No current facility-administered medications on file prior to visit.    Review of Systems  Constitutional: Positive for chills and fatigue. Negative for fever.  HENT: Positive for congestion, postnasal drip and rhinorrhea. Negative for ear discharge, ear pain, sinus pressure, sore throat and trouble swallowing.   Respiratory: Positive for cough, chest tightness and shortness of breath. Negative for choking.   Cardiovascular: Negative for chest pain and palpitations.  Gastrointestinal: Positive for nausea and diarrhea. Negative for vomiting and abdominal pain.  Musculoskeletal: Negative for myalgias, neck pain and neck stiffness.  Skin: Negative for rash.  Neurological: Negative for dizziness, syncope, weakness and headaches.   Per HPI unless specifically indicated above     Objective:    BP 127/67 mmHg  Pulse 86  Temp(Src) 98.3 F (36.8 C) (Oral)  Resp 16  Ht 5\' 7"  (1.702 m)  Wt 204 lb (92.534 kg)  BMI 31.94 kg/m2  SpO2 92%  Wt Readings from Last 3 Encounters:  11/05/15 204 lb (92.534 kg)  10/26/15 205 lb 7.5 oz (93.2 kg)  10/11/15 203 lb 12.8 oz (92.443 kg)    Physical Exam  HENT:  Head: Normocephalic and atraumatic.  Right Ear: Hearing and tympanic membrane normal.  Left Ear: Hearing normal. Tympanic membrane is retracted.  Nose: Mucosal edema and rhinorrhea present. Right sinus exhibits no maxillary sinus tenderness and no frontal sinus tenderness. Left sinus exhibits no maxillary sinus tenderness and no frontal sinus tenderness.  Mouth/Throat: Uvula is midline and mucous membranes are normal. Posterior oropharyngeal erythema present.  Cardiovascular: Normal rate, regular rhythm and normal pulses.  Exam reveals no gallop and  no friction rub.   No murmur heard. Pulmonary/Chest: No respiratory distress. He has no decreased breath sounds. He has wheezes in the right upper field, the right middle field, the right lower field, the left upper field, the left middle field and the left lower field. He has no rhonchi. He has no rales. Chest wall is not dull to percussion.  Skin: Skin is warm and dry.   Results for orders placed or performed in visit on 11/05/15  POCT Influenza A/B  Result Value Ref Range   Influenza A, POC Negative Negative   Influenza B, POC Negative Negative      Assessment & Plan:   Problem List Items Addressed This Visit  Respiratory   COPD (chronic obstructive pulmonary disease) (HCC) (Chronic)    ECG WNL. CXR WNL. Treat for COPD exacerbation. Add Zpak due to productive cough, dyspnea. Prednisone x 5 days. Nebulizers ordered for home. Pt instructed to go to ER if breathing doesn't improve, elevated fever, or blood tinged sputum      Relevant Medications   ipratropium-albuterol (DUONEB) 0.5-2.5 (3) MG/3ML nebulizer solution 3 mL   azithromycin (ZITHROMAX) 250 MG tablet   predniSONE (DELTASONE) 20 MG tablet   albuterol (PROVENTIL) (2.5 MG/3ML) 0.083% nebulizer solution    Other Visit Diagnoses    Fever and chills    -  Primary    Flu negative. Likley 2/2 viral syndrome.     Relevant Orders    POCT Influenza A/B (Completed)    DG Chest 2 View (Completed)    Chest tightness or pressure        ECG shows SR with PVCs.  No acute changes. Likely pulmonary relate chest pressure.     Relevant Orders    EKG 12-Lead       Meds ordered this encounter  Medications  . ipratropium-albuterol (DUONEB) 0.5-2.5 (3) MG/3ML nebulizer solution 3 mL    Sig:   . azithromycin (ZITHROMAX) 250 MG tablet    Sig: Take 2 tabs today and 1 tablet daily until bottle is empty.    Dispense:  6 tablet    Refill:  0    Order Specific Question:  Supervising Provider    Answer:  Arlis Porta [id number]  .  predniSONE (DELTASONE) 20 MG tablet    Sig: Take 2 tablets (40 mg total) by mouth daily with breakfast.    Dispense:  10 tablet    Refill:  0    Order Specific Question:  Supervising Provider    Answer:  Arlis Porta [phone]  . albuterol (PROVENTIL) (2.5 MG/3ML) 0.083% nebulizer solution    Sig: Take 3 mLs (2.5 mg total) by nebulization every 6 (six) hours as needed for wheezing or shortness of breath.    Dispense:  150 mL    Refill:  1    Order Specific Question:  Supervising Provider    Answer:  Arlis Porta [id number]      Follow up plan: Return in about 2 weeks (around 11/19/2015), or if symptoms worsen or fail to improve, for depression. Marland Kitchen

## 2015-11-05 NOTE — Patient Instructions (Signed)
We are treating you for a COPD exacerbation today. Take prednisone 40mg  daily for 5 days.  Start using nebulizer machine as needed for shortness of breath.  Take Zpak- 2 pills today and 1 pill daily until bottle is empty.   Please seek immediate medical attention if you develop shortness of breath not relieve by inhaler, chest pain/tightness, fever > 103 F or other concerning symptoms.

## 2015-11-05 NOTE — Assessment & Plan Note (Signed)
ECG WNL. CXR WNL. Treat for COPD exacerbation. Add Zpak due to productive cough, dyspnea. Prednisone x 5 days. Nebulizers ordered for home. Pt instructed to go to ER if breathing doesn't improve, elevated fever, or blood tinged sputum

## 2015-11-12 ENCOUNTER — Telehealth: Payer: Self-pay | Admitting: *Deleted

## 2015-11-12 NOTE — Telephone Encounter (Signed)
Notified patient that his annual  follow up lung cancer screening low dose CT scan is due. Confirmed that patient is within age range of 55-77, asymptomatic of lung cancer, and no other serious disease processes that would make treatment of lung cancer not possible. The patient is a  current/former smoker with a 30.5 pack year history. He has reduced his cigarette smoking recently to about 5 a day. The shared decision making visit was completed 10/04/2014. The patient is agreeable for CT scan to be scheduled in June 2017 if insurance approves it.

## 2015-11-13 ENCOUNTER — Encounter: Payer: Self-pay | Admitting: Family Medicine

## 2015-11-13 ENCOUNTER — Ambulatory Visit (INDEPENDENT_AMBULATORY_CARE_PROVIDER_SITE_OTHER): Payer: Medicare Other | Admitting: Sports Medicine

## 2015-11-13 ENCOUNTER — Other Ambulatory Visit: Payer: Self-pay | Admitting: Family Medicine

## 2015-11-13 ENCOUNTER — Ambulatory Visit (INDEPENDENT_AMBULATORY_CARE_PROVIDER_SITE_OTHER): Payer: Medicare Other | Admitting: Family Medicine

## 2015-11-13 ENCOUNTER — Encounter: Payer: Self-pay | Admitting: Sports Medicine

## 2015-11-13 VITALS — BP 143/78 | HR 76 | Temp 98.3°F | Resp 16 | Ht 67.0 in | Wt 198.0 lb

## 2015-11-13 DIAGNOSIS — E1122 Type 2 diabetes mellitus with diabetic chronic kidney disease: Secondary | ICD-10-CM | POA: Diagnosis not present

## 2015-11-13 DIAGNOSIS — R1030 Lower abdominal pain, unspecified: Secondary | ICD-10-CM

## 2015-11-13 DIAGNOSIS — N400 Enlarged prostate without lower urinary tract symptoms: Secondary | ICD-10-CM

## 2015-11-13 DIAGNOSIS — B351 Tinea unguium: Secondary | ICD-10-CM

## 2015-11-13 DIAGNOSIS — E1142 Type 2 diabetes mellitus with diabetic polyneuropathy: Secondary | ICD-10-CM

## 2015-11-13 DIAGNOSIS — J441 Chronic obstructive pulmonary disease with (acute) exacerbation: Secondary | ICD-10-CM

## 2015-11-13 DIAGNOSIS — J449 Chronic obstructive pulmonary disease, unspecified: Secondary | ICD-10-CM | POA: Insufficient documentation

## 2015-11-13 DIAGNOSIS — R7989 Other specified abnormal findings of blood chemistry: Secondary | ICD-10-CM

## 2015-11-13 DIAGNOSIS — M79676 Pain in unspecified toe(s): Secondary | ICD-10-CM

## 2015-11-13 DIAGNOSIS — J432 Centrilobular emphysema: Secondary | ICD-10-CM

## 2015-11-13 DIAGNOSIS — E785 Hyperlipidemia, unspecified: Secondary | ICD-10-CM | POA: Insufficient documentation

## 2015-11-13 DIAGNOSIS — E291 Testicular hypofunction: Secondary | ICD-10-CM

## 2015-11-13 DIAGNOSIS — N183 Chronic kidney disease, stage 3 (moderate): Secondary | ICD-10-CM

## 2015-11-13 DIAGNOSIS — F1320 Sedative, hypnotic or anxiolytic dependence, uncomplicated: Secondary | ICD-10-CM | POA: Diagnosis not present

## 2015-11-13 DIAGNOSIS — Z794 Long term (current) use of insulin: Secondary | ICD-10-CM

## 2015-11-13 DIAGNOSIS — R1032 Left lower quadrant pain: Secondary | ICD-10-CM

## 2015-11-13 DIAGNOSIS — Z79899 Other long term (current) drug therapy: Secondary | ICD-10-CM | POA: Insufficient documentation

## 2015-11-13 LAB — POCT URINALYSIS DIPSTICK
Bilirubin, UA: NEGATIVE
GLUCOSE UA: 1000
Ketones, UA: NEGATIVE
LEUKOCYTES UA: NEGATIVE
Nitrite, UA: NEGATIVE
RBC UA: NEGATIVE
SPEC GRAV UA: 1.015
UROBILINOGEN UA: 4
pH, UA: 5

## 2015-11-13 MED ORDER — ALBUTEROL SULFATE (2.5 MG/3ML) 0.083% IN NEBU: 2.5000 mg | INHALATION_SOLUTION | Freq: Four times a day (QID) | RESPIRATORY_TRACT | Status: AC | PRN

## 2015-11-13 NOTE — Progress Notes (Signed)
Subjective:    Patient ID: Colton Carter., male    DOB: [birth year], 70 y.o.   MRN: [id number]  HPI: Colton Carter. is a 70 y.o. male presenting on 11/13/2015 for Hernia   HPI  Pt presents for L groin pain. Side hurts when he sits and bends. Symptoms began 6 weeks ago. Bulge in groin. Has been getting bigger. Risk factors: Obesity and previous abdominal surgery. No heavy lifting. Pulls when lifting or straining. Hurts when it walks. No dysuria. No fever. No numbness or tingling in the extremity. Pt also reported his benzodiazepines did not show up on routine drug screening at [facility]. Reports he is taking Xanax daily. He is concerned as to why these did now show up.  Pt would also like referral to new urologist. His previous retired. Had been seen for low testosterone and BPH in the past. He reported he had a prostate biospy in the past- unsure if PSA's were elevated. Have not been checked in several years. Pt reports breathing is doing much better after COPD exacerbation last week.    Past Medical History  Diagnosis Date  . Coronary artery disease   . Diabetes mellitus   . Hypertension   . Arthritis   . High cholesterol   . Renal disorder   . Chronic neck pain   . Polycythemia, secondary 01/10/2015  . COPD (chronic obstructive pulmonary disease) ([place])   . Peripheral vascular disease ([place])   . Skin cancer 2014    nose  . Fatty liver   . Pancreatitis   . Diverticulosis   . Allergy   . Emphysema of lung ([place])   . Headache   . GERD (gastroesophageal reflux disease)   . Stomach ulcer     in past  . Obesity     Current Outpatient Prescriptions on File Prior to Visit  Medication Sig  . ADVAIR DISKUS 250-50 MCG/DOSE AEPB INHALE ONE PUFF EVERY 12 HOURS  . albuterol (PROVENTIL) (2.5 MG/3ML) 0.083% nebulizer solution Take 3 mLs (2.5 mg total) by nebulization every 6 (six) hours as needed for wheezing or shortness of breath.  . ALPRAZolam (XANAX) 0.25 MG tablet Take 1 tablet (0.25 mg  total) by mouth 3 (three) times daily.  Marland Kitchen aspirin 81 MG tablet Take 81 mg by mouth daily.  Marland Kitchen atenolol (TENORMIN) 25 MG tablet Take 1 tablet by mouth daily.  . baclofen (LIORESAL) 10 MG tablet TAKE ONE TABLET BY MOUTH EVERY EIGHT HOURS  . Cholecalciferol (VITAMIN D-3) 5000 UNITS TABS Take by mouth 2 (two) times daily.  . diclofenac sodium (VOLTAREN) 1 % GEL Apply 2 g topically 4 (four) times daily.  Scarlette Shorts SURECLICK 50 MG/ML injection   . furosemide (LASIX) 40 MG tablet TAKE ONE (1) TABLET BY MOUTH EVERY DAY  . gabapentin (NEURONTIN) 600 MG tablet TAKE ONE (1) TABLET FOUR (4) TIMES PER DAY  . HYDROcodone-acetaminophen (NORCO) 10-325 MG tablet Take 1 tablet by mouth every 6 (six) hours as needed.  . insulin aspart (NOVOLOG) 100 UNIT/ML injection Inject into the skin 3 (three) times daily before meals. Sliding scale three times a day at mealtime  . Insulin Glargine (LANTUS) 100 UNIT/ML Solostar Pen Inject 65 Units into the skin daily at 10 pm.  . Insulin Pen Needle 31G X 8 MM MISC 1 each by Does not apply route 3 (three) times daily.  . INVOKANA 300 MG TABS tablet TAKE ONE (1) TABLET EACH DAY  . ketoconazole (NIZORAL) 2 % cream  Apply 1 application topically daily.  Marland Kitchen levocetirizine ([name]) 5 MG tablet Take 1 tablet (5 mg total) by mouth every evening.  . montelukast (SINGULAIR) 10 MG tablet Take by mouth.  . niacin 500 MG tablet Take 500 mg by mouth at bedtime.  Marland Kitchen NOVOLOG FLEXPEN 100 UNIT/ML FlexPen USE AS DIRECTED PER SLIDING SCALE 100-150-5 UNITS, 151-200-7 UNITS, 201- 250-9 UNITS, 251-300-11 UNITS, 301-350- 13 UNITS, 351-400-15 UNITS  . Omega-3 Fatty Acids (FISH OIL) 1000 MG CAPS Take by mouth 2 (two) times daily. Reported on 10/26/2015  . omeprazole (PRILOSEC) 20 MG capsule Take 1 capsule (20 mg total) by mouth 2 (two) times daily before a meal.  . polyethylene glycol (MIRALAX / GLYCOLAX) packet Take 17 g by mouth daily as needed.  . polyethylene glycol powder (GLYCOLAX/MIRALAX) powder 255  grams one bottle for colonoscopy prep  . senna (SENOKOT) 8.6 MG tablet Take 1 tablet by mouth daily.  . sertraline (ZOLOFT) 100 MG tablet Take 1 tablet (100 mg total) by mouth daily.  . sertraline (ZOLOFT) 50 MG tablet 50 mg.  . simvastatin (ZOCOR) 40 MG tablet TAKE ONE (1) TABLET BY MOUTH EVERY DAY  . ULORIC 80 MG TABS Take 1 tablet by mouth every other day.  . VENTOLIN HFA 108 (90 Base) MCG/ACT inhaler INHALE TWO PUFFS EVERY 4-6 HOURS  . vitamin B-12 (CYANOCOBALAMIN) 500 MCG tablet Take 1,000 mcg by mouth daily.  . vitamin C (ASCORBIC ACID) 500 MG tablet Take 500 mg by mouth daily.   Current Facility-Administered Medications on File Prior to Visit  Medication  . ipratropium-albuterol (DUONEB) 0.5-2.5 (3) MG/3ML nebulizer solution 3 mL    Review of Systems  Constitutional: Negative for fever and chills.  HENT: Negative.   Respiratory: Negative for chest tightness, shortness of breath and wheezing.   Cardiovascular: Negative for chest pain, palpitations and leg swelling.  Gastrointestinal: Negative for nausea, vomiting and abdominal pain.       Bulge in groin   Endocrine: Negative.   Genitourinary: Negative for dysuria, urgency, discharge, penile pain and testicular pain.  Musculoskeletal: Negative for back pain, joint swelling and arthralgias.  Skin: Negative.   Neurological: Negative for dizziness, weakness, numbness and headaches.  Psychiatric/Behavioral: Negative for sleep disturbance and dysphoric mood.   Per HPI unless specifically indicated above     Objective:    BP 143/78 mmHg  Pulse 76  Temp(Src) 98.3 F (36.8 C) (Oral)  Resp 16  Ht 5\' 7"  (1.702 m)  Wt 198 lb (89.812 kg)  BMI 31.00 kg/m2  SpO2 94%  Wt Readings from Last 3 Encounters:  11/13/15 198 lb (89.812 kg)  11/05/15 204 lb (92.534 kg)  10/26/15 205 lb 7.5 oz (93.2 kg)    Physical Exam  Constitutional: He is oriented to person, place, and time. He appears well-developed and well-nourished. No distress.   HENT:  Head: Normocephalic and atraumatic.  Neck: Neck supple. No thyromegaly present.  Cardiovascular: Normal rate, regular rhythm and normal heart sounds.  Exam reveals no gallop and no friction rub.   No murmur heard. Pulmonary/Chest: Effort normal and breath sounds normal. He has no wheezes.  Abdominal: Soft. Bowel sounds are normal. He exhibits no distension. There is no tenderness. There is no rebound. Hernia confirmed negative in the ventral area, confirmed negative in the right inguinal area and confirmed negative in the left inguinal area.  Genitourinary: Testes normal and penis normal.    Circumcised. No penile erythema or penile tenderness.  Musculoskeletal: Normal range of motion. He  exhibits no edema or tenderness.  Lymphadenopathy:       Right: No inguinal adenopathy present.       Left: No inguinal adenopathy present.  Neurological: He is alert and oriented to person, place, and time. He has normal reflexes.  Skin: Skin is warm and dry. No rash noted. No erythema.  Psychiatric: He has a normal mood and affect. His behavior is normal. Thought content normal.   Results for orders placed or performed in visit on 11/13/15  POCT urinalysis dipstick  Result Value Ref Range   Color, UA Amber    Clarity, UA clear    Glucose, UA 1000    Bilirubin, UA negative    Ketones, UA negative    Spec Grav, UA 1.015    Blood, UA negative    pH, UA 5.0    Protein, UA trace    Urobilinogen, UA 4.0    Nitrite, UA negative    Leukocytes, UA Negative Negative      Assessment & Plan:   Problem List Items Addressed This Visit      Respiratory   COPD (chronic obstructive pulmonary disease) (HCC) (Chronic)    Exacerbation appears resolved.         Endocrine   DM (diabetes mellitus) (HCC) (Chronic)    Check lipids and CMET today.       Relevant Orders   Comprehensive metabolic panel   Lipid Profile     Genitourinary   BPH (benign prostatic hyperplasia)    Check PSA. If  elevated- consider referral to urology.       Relevant Orders   PSA     Other   Low testosterone (Chronic)    Treated by urology in the past. He would like to see urology again. Will check testosterone levels today.       Relevant Orders   Testosterone    Other Visit Diagnoses    Left groin pain    -  Primary    R/o hernia with [place]. Possible groin strain. Heat, massage. Avoid heavy lifting.     Relevant Orders    POCT urinalysis dipstick (Completed)    [place] Extrem Low Left Ltd    Benzodiazepine dependence ([place])        Recheck urine drug screen due to benzo use. If not present in this screen, pt will not get benzodiazipines from this office.     Relevant Orders    ToxASSURE Select 13 (MW), Urine       No orders of the defined types were placed in this encounter.      Follow up plan: Return if symptoms worsen or fail to improve, for keep as scheduled. Marland Kitchen

## 2015-11-13 NOTE — Assessment & Plan Note (Signed)
Exacerbation appears resolved.

## 2015-11-13 NOTE — Progress Notes (Signed)
Patient ID: Colton Carter., male   DOB: 1946-01-31, 70 y.o.   MRN: [id number]  Subjective: Colton Porro. is a 70 y.o. male patient with history of type 2 diabetes who returns to office today complaining of long, painful nails  while ambulating in shoes; unable to trim. Patient states that the glucose reading this morning was 102 mg/dl. Patient denies any new changes in medication or new problems. Patient denies any new cramping, numbness, burning or tingling in the legs.  Patient Active Problem List   Diagnosis Date Noted  . Chronic obstructive pulmonary disease ([place]) 11/13/2015  . Controlled type 2 diabetes mellitus without complication ([place]) 123456  . HLD (hyperlipidemia) 11/13/2015  . 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 BPH (benign prostatic hyperplasia) 11/13/2015  . Current tobacco use 10/11/2015  . Type 2 diabetes mellitus ([place]) 08/06/2015  . Anxiety and depression 08/06/2015  . Polycythemia, secondary 01/10/2015  . Low testosterone 04/15/2012  . Rheumatoid arthritis ([place]) 04/15/2012  . GERD (gastroesophageal reflux disease) 04/15/2012  . COPD (chronic obstructive pulmonary disease) ([place]) 04/15/2012  . Chronic renal failure 04/14/2012  . Multiple fractures of ribs of right side 04/13/2012  . Fall from ladder 04/13/2012  . Abrasion of left knee 04/13/2012  . DM (diabetes mellitus) ([place]) 04/13/2012  . Acute blood loss anemia 04/13/2012  . High cholesterol   . Chronic neck pain    Current Outpatient Prescriptions on File Prior to Visit  Medication Sig Dispense Refill  . ADVAIR DISKUS 250-50 MCG/DOSE AEPB INHALE ONE PUFF EVERY 12 HOURS 60 each 11  . ALPRAZolam (XANAX) 0.25 MG tablet Take 1 tablet (0.25 mg total) by mouth 3 (three) times daily. 90 tablet 2  . aspirin 81 MG tablet Take 81 mg by mouth daily.    Marland Kitchen atenolol (TENORMIN) 25 MG tablet Take 1 tablet by mouth daily.    . baclofen (LIORESAL) 10 MG tablet TAKE ONE TABLET BY MOUTH EVERY EIGHT HOURS 90 each  3  . Cholecalciferol (VITAMIN D-3) 5000 UNITS TABS Take by mouth 2 (two) times daily.    . diclofenac sodium (VOLTAREN) 1 % GEL Apply 2 g topically 4 (four) times daily. 100 g 5  . ENBREL SURECLICK 50 MG/ML injection     . furosemide (LASIX) 40 MG tablet TAKE ONE (1) TABLET BY MOUTH EVERY DAY 30 tablet 11  . gabapentin (NEURONTIN) 600 MG tablet TAKE ONE (1) TABLET FOUR (4) TIMES PER DAY 120 tablet 11  . HYDROcodone-acetaminophen (NORCO) 10-325 MG tablet Take 1 tablet by mouth every 6 (six) hours as needed.    . insulin aspart (NOVOLOG) 100 UNIT/ML injection Inject into the skin 3 (three) times daily before meals. Sliding scale three times a day at mealtime    . Insulin Glargine (LANTUS) 100 UNIT/ML Solostar Pen Inject 65 Units into the skin daily at 10 pm. 45 mL 3  . Insulin Pen Needle 31G X 8 MM MISC 1 each by Does not apply route 3 (three) times daily. 100 each 11  . INVOKANA 300 MG TABS tablet TAKE ONE (1) TABLET EACH DAY 90 tablet 3  . ketoconazole (NIZORAL) 2 % cream Apply 1 application topically daily. 60 g 2  . levocetirizine ([name]) 5 MG tablet Take 1 tablet (5 mg total) by mouth every evening. 30 tablet 6  . montelukast (SINGULAIR) 10 MG tablet Take by mouth.    . niacin 500 MG tablet Take 500 mg by mouth at bedtime.    Marland Kitchen NOVOLOG FLEXPEN  100 UNIT/ML FlexPen USE AS DIRECTED PER SLIDING SCALE 100-150-5 UNITS, 151-200-7 UNITS, 201- 250-9 UNITS, 251-300-11 UNITS, 301-350- 13 UNITS, 351-400-15 UNITS 15 mL 11  . Omega-3 Fatty Acids (FISH OIL) 1000 MG CAPS Take by mouth 2 (two) times daily. Reported on 10/26/2015    . omeprazole (PRILOSEC) 20 MG capsule Take 1 capsule (20 mg total) by mouth 2 (two) times daily before a meal. 60 capsule 11  . polyethylene glycol (MIRALAX / GLYCOLAX) packet Take 17 g by mouth daily as needed. 24 each 6  . polyethylene glycol powder (GLYCOLAX/MIRALAX) powder 255 grams one bottle for colonoscopy prep 255 g 0  . senna (SENOKOT) 8.6 MG tablet Take 1 tablet by mouth  daily.    . sertraline (ZOLOFT) 100 MG tablet Take 1 tablet (100 mg total) by mouth daily. 90 tablet 3  . sertraline (ZOLOFT) 50 MG tablet 50 mg.    . simvastatin (ZOCOR) 40 MG tablet TAKE ONE (1) TABLET BY MOUTH EVERY DAY 90 tablet 3  . ULORIC 80 MG TABS Take 1 tablet by mouth every other day.    . VENTOLIN HFA 108 (90 Base) MCG/ACT inhaler INHALE TWO PUFFS EVERY 4-6 HOURS 18 g 3  . vitamin B-12 (CYANOCOBALAMIN) 500 MCG tablet Take 1,000 mcg by mouth daily.    . vitamin C (ASCORBIC ACID) 500 MG tablet Take 500 mg by mouth daily.     Current Facility-Administered Medications on File Prior to Visit  Medication Dose Route Frequency Provider Last Rate Last Dose  . ipratropium-albuterol (DUONEB) 0.5-2.5 (3) MG/3ML nebulizer solution 3 mL  3 mL Nebulization Q6H Amy Lauren Krebs, NP       Allergies  Allergen Reactions  . Levaquin [Levofloxacin In D5w] Other (See Comments)    Kidney shut down  . Milk-Related Compounds Nausea And Vomiting  . Septra Ds [Sulfamethoxazole-Trimethoprim]      Objective: General: Patient is awake, alert, and oriented x 3 and in no acute distress.  Integument: Skin is warm, dry and supple bilateral. Nails are tender, long, thickened and  dystrophic with subungual debris, consistent with onychomycosis, 1-5 bilateral; nail spicules are noted at both hallux nails from previous nail avulsions. No scaly skin plantar surfaces- tinea resolved. No open lesions or preulcerative lesions present bilateral. Remaining integument unremarkable.  Vasculature:  Dorsalis Pedis pulse 2/4 bilateral. Posterior Tibial pulse  2/4 bilateral.  Capillary fill time <3 sec 1-5 bilateral. No hair growth to the level of the digits. Temperature gradient within normal limits. + hyperpigmentation and varicosities present bilateral. No edema present bilateral.   Neurology: The patient has intact sensation measured with a 5.07/10g Semmes Weinstein Monofilament at all pedal sites bilateral . Vibratory  sensation diminished bilateral with tuning fork. No Babinski sign present bilateral.   Musculoskeletal: Mild asymptomatic bunion and hammer  deformities noted bilateral. Muscular strength 5/5 in all lower extremity muscular groups bilateral without pain or limitation on range of motion . No tenderness with calf compression bilateral.  Assessment and Plan: Problem List Items Addressed This Visit    None    Visit Diagnoses    Dermatophytosis of nail    -  Primary    Pain of toe, unspecified laterality        Type 2 diabetes mellitus with diabetic polyneuropathy, unspecified long term insulin use status ([place])           -Examined patient. -Discussed and educated patient on diabetic foot care, especially with  regards to the vascular, neurological and musculoskeletal systems.  -  Stressed the importance of good glycemic control and the detriment of not  controlling glucose levels in relation to the foot. -Mechanically debrided all nails 1-5 bilateral using sterile nail nipper and filed with dremel without incident  -Gave patient silicone toe cap to protect 5th toes bilateral; recommend good supportive shoes daily -Answered all patient questions -Patient to return in 3 months for at risk foot care -Patient advised to call the office if any problems or questions arise in the meantime.  Landis Martins, DPM

## 2015-11-13 NOTE — Assessment & Plan Note (Signed)
Treated by urology in the past. He would like to see urology again. Will check testosterone levels today.

## 2015-11-13 NOTE — Patient Instructions (Signed)
We will get an ultrasound of your groin to ensure you do not have a hernia on the L side. If you develop severe abdominal pain, lump or bulge is growing very large, fever, or severe nausea/vomiting- go to the ER.

## 2015-11-13 NOTE — Assessment & Plan Note (Signed)
Check PSA. If elevated- consider referral to urology.

## 2015-11-13 NOTE — Assessment & Plan Note (Signed)
Check lipids and CMET today 

## 2015-11-14 ENCOUNTER — Telehealth: Payer: Self-pay | Admitting: *Deleted

## 2015-11-14 NOTE — Telephone Encounter (Signed)
Patient would like to get nebs from [facility] order will be faxed today. Patient aware of u/s scheduled for 11/22/15 arrival 2:45 for 3 pm at [facility].

## 2015-11-14 NOTE — Telephone Encounter (Signed)
Called patient to see if he would like [facility] to dispense Albuterol (neb solutions)?

## 2015-11-16 ENCOUNTER — Other Ambulatory Visit: Payer: Self-pay | Admitting: Family Medicine

## 2015-11-16 ENCOUNTER — Encounter: Payer: Self-pay | Admitting: Family Medicine

## 2015-11-16 DIAGNOSIS — Z87891 Personal history of nicotine dependence: Secondary | ICD-10-CM

## 2015-11-16 HISTORY — DX: Personal history of nicotine dependence: Z87.891

## 2015-11-18 LAB — TOXASSURE SELECT 13 (MW), URINE

## 2015-11-19 ENCOUNTER — Telehealth: Payer: Self-pay | Admitting: Family Medicine

## 2015-11-19 NOTE — Telephone Encounter (Signed)
Called [facility] to discuss urine drug screen. Xanax is sometimes under the dosing threshold but is usually positive on the screen for benzodiazepines. If patient is dosed as prescribed the Xanax should be present in the screen and dosing threshold per [facility] clinical consultant.

## 2015-11-20 ENCOUNTER — Other Ambulatory Visit: Payer: Self-pay | Admitting: Family Medicine

## 2015-11-20 DIAGNOSIS — R7989 Other specified abnormal findings of blood chemistry: Secondary | ICD-10-CM

## 2015-11-20 LAB — PSA: Prostate Specific Ag, Serum: 0.2 ng/mL (ref 0.0–4.0)

## 2015-11-20 LAB — LIPID PANEL
Chol/HDL Ratio: 3.3 ratio units (ref 0.0–5.0)
Cholesterol, Total: 124 mg/dL (ref 100–199)
HDL: 38 mg/dL — AB (ref >39)
LDL CALC: 51 mg/dL (ref 0–99)
Triglycerides: 176 mg/dL — ABNORMAL HIGH (ref 0–149)
VLDL CHOLESTEROL CAL: 35 mg/dL (ref 5–40)

## 2015-11-20 LAB — COMPREHENSIVE METABOLIC PANEL
ALBUMIN: 4.3 g/dL (ref 3.6–4.8)
ALT: 14 IU/L (ref 0–44)
AST: 15 IU/L (ref 0–40)
Albumin/Globulin Ratio: 1.7 (ref 1.2–2.2)
Alkaline Phosphatase: 78 IU/L (ref 39–117)
BILIRUBIN TOTAL: 0.3 mg/dL (ref 0.0–1.2)
BUN / CREAT RATIO: 17 (ref 10–24)
BUN: 22 mg/dL (ref 8–27)
CALCIUM: 9.3 mg/dL (ref 8.6–10.2)
CO2: 26 mmol/L (ref 18–29)
CREATININE: 1.3 mg/dL — AB (ref 0.76–1.27)
Chloride: 102 mmol/L (ref 96–106)
GFR, EST AFRICAN AMERICAN: 64 mL/min/{1.73_m2} (ref >59)
GFR, EST NON AFRICAN AMERICAN: 56 mL/min/{1.73_m2} — AB (ref >59)
GLUCOSE: 157 mg/dL — AB (ref 65–99)
Globulin, Total: 2.5 g/dL (ref 1.5–4.5)
Potassium: 4.5 mmol/L (ref 3.5–5.2)
Sodium: 146 mmol/L — ABNORMAL HIGH (ref 134–144)
TOTAL PROTEIN: 6.8 g/dL (ref 6.0–8.5)

## 2015-11-20 LAB — TESTOSTERONE: Testosterone: 318 ng/dL — ABNORMAL LOW (ref 348–1197)

## 2015-11-22 ENCOUNTER — Ambulatory Visit
Admission: RE | Admit: 2015-11-22 | Discharge: 2015-11-22 | Disposition: A | Discharge status: Home / Self Care | Payer: Medicare Other | Source: Ambulatory Visit | Attending: Family Medicine | Admitting: Family Medicine

## 2015-11-22 DIAGNOSIS — R1030 Lower abdominal pain, unspecified: Secondary | ICD-10-CM | POA: Diagnosis not present

## 2015-11-22 DIAGNOSIS — K4090 Unilateral inguinal hernia, without obstruction or gangrene, not specified as recurrent: Secondary | ICD-10-CM | POA: Insufficient documentation

## 2015-11-22 DIAGNOSIS — R1032 Left lower quadrant pain: Secondary | ICD-10-CM

## 2015-11-23 ENCOUNTER — Other Ambulatory Visit: Payer: Self-pay | Admitting: Family Medicine

## 2015-11-23 DIAGNOSIS — K4090 Unilateral inguinal hernia, without obstruction or gangrene, not specified as recurrent: Secondary | ICD-10-CM

## 2015-12-04 ENCOUNTER — Telehealth: Payer: Self-pay | Admitting: Family Medicine

## 2015-12-04 ENCOUNTER — Ambulatory Visit (INDEPENDENT_AMBULATORY_CARE_PROVIDER_SITE_OTHER): Payer: Medicare Other | Admitting: General Surgery

## 2015-12-04 ENCOUNTER — Encounter: Payer: Self-pay | Admitting: General Surgery

## 2015-12-04 VITALS — BP 142/76 | HR 78 | Resp 12 | Ht 67.0 in | Wt 201.0 lb

## 2015-12-04 DIAGNOSIS — Z794 Long term (current) use of insulin: Principal | ICD-10-CM

## 2015-12-04 DIAGNOSIS — R1030 Lower abdominal pain, unspecified: Secondary | ICD-10-CM | POA: Diagnosis not present

## 2015-12-04 DIAGNOSIS — R1032 Left lower quadrant pain: Secondary | ICD-10-CM

## 2015-12-04 DIAGNOSIS — E1142 Type 2 diabetes mellitus with diabetic polyneuropathy: Secondary | ICD-10-CM

## 2015-12-04 MED ORDER — INSULIN ASPART 100 UNIT/ML ~~LOC~~ SOLN: SUBCUTANEOUS | Status: DC

## 2015-12-04 MED ORDER — INSULIN GLARGINE 100 UNIT/ML SOLOSTAR PEN: 65.0000 [IU] | PEN_INJECTOR | Freq: Two times a day (BID) | SUBCUTANEOUS | Status: DC

## 2015-12-04 NOTE — Progress Notes (Signed)
Patient ID: Colton Carter., male   DOB: 25-Sep-1945, 70 y.o.   MRN: [id number]  Chief Complaint  Patient presents with  . Other    left inguinal hernia    HPI Colton Carter. is a 70 y.o. male here today for a evaluation of a left inguinal hernia. Patient states he noticed this area 7 weeks ago. Hurts when he sits and bends. Bulge in groin. Has been getting bigger. Patient had an ultrasound on 11/22/15. I have reviewed the history of present illness with the patient.  HPI  Past Medical History  Diagnosis Date  . Coronary artery disease   . Diabetes mellitus   . Hypertension   . Arthritis   . High cholesterol   . Renal disorder   . Chronic neck pain   . Polycythemia, secondary 01/10/2015  . COPD (chronic obstructive pulmonary disease) ([place])   . Peripheral vascular disease ([place])   . Skin cancer 2014    nose  . Fatty liver   . Pancreatitis   . Diverticulosis   . Allergy   . Emphysema of lung ([place])   . Headache   . GERD (gastroesophageal reflux disease)   . Stomach ulcer     in past  . Obesity   . Personal history of tobacco use, presenting hazards to health 11/16/2015    Past Surgical History  Procedure Laterality Date  . Cervical fusion    . Peripheral vascular catheterization N/A 02/06/2015    Procedure: Abdominal Aortogram w/Lower Extremity;  Surgeon: Katha Cabal, MD;  Location: [facility];  Service: Cardiovascular;  Laterality: N/A;  . Peripheral vascular catheterization  02/06/2015    Procedure: Lower Extremity Intervention;  Surgeon: Katha Cabal, MD;  Location: [facility];  Service: Cardiovascular;;  . Peripheral vascular catheterization Left 03/20/2015    Procedure: Lower Extremity Angiography;  Surgeon: Katha Cabal, MD;  Location: [facility];  Service: Cardiovascular;  Laterality: Left;  . Peripheral vascular catheterization  03/20/2015    Procedure: Lower Extremity Intervention;  Surgeon: Katha Cabal, MD;  Location:  [facility];  Service: Cardiovascular;;  . Colonoscopy  2011    Dr [name]  . Skin cancer excision  2014  . Upper gi endoscopy    . Colonoscopy with propofol N/A 07/11/2015    Procedure: COLONOSCOPY WITH PROPOFOL;  Surgeon: Christene Lye, MD;  Location: [facility];  Service: Endoscopy;  Laterality: N/A;  . Esophagogastroduodenoscopy (egd) with propofol N/A 07/11/2015    Procedure: ESOPHAGOGASTRODUODENOSCOPY (EGD) WITH PROPOFOL;  Surgeon: Christene Lye, MD;  Location: [facility];  Service: Endoscopy;  Laterality: N/A;  . Appendectomy  1980  . Cholecystectomy  2006    Family History  Problem Relation Age of Onset  . Pancreatic cancer Father   . Cancer Father   . Stroke Father   . Heart disease Mother   . Diabetes Mother   . Depression Mother     Social History Social History  Substance Use Topics  . Smoking status: Current Every Day Smoker -- 0.25 packs/day for 50 years    Types: Cigarettes  . Smokeless tobacco: Never Used     Comment: 5 cigarettes a day  . Alcohol Use: No    Allergies  Allergen Reactions  . Levaquin [Levofloxacin In D5w] Other (See Comments)    Kidney shut down  . Milk-Related Compounds Nausea And Vomiting  . Septra Ds [Sulfamethoxazole-Trimethoprim]     Current Outpatient Prescriptions  Medication Sig Dispense Refill  . ADVAIR DISKUS 250-50 MCG/DOSE AEPB INHALE ONE PUFF EVERY 12 HOURS 60 each 11  . albuterol (PROVENTIL) (2.5 MG/3ML) 0.083% nebulizer solution Take 3 mLs (2.5 mg total) by nebulization every 6 (six) hours as needed for wheezing or shortness of breath. 150 mL 1  . aspirin 81 MG tablet Take 81 mg by mouth daily.    Marland Kitchen atenolol (TENORMIN) 25 MG tablet Take 1 tablet by mouth daily.    . baclofen (LIORESAL) 10 MG tablet TAKE ONE TABLET BY MOUTH EVERY EIGHT HOURS 90 each 3  . Cholecalciferol (VITAMIN D-3) 5000 UNITS TABS Take by mouth 2 (two) times daily.    . diclofenac sodium (VOLTAREN) 1 % GEL Apply 2 g  topically 4 (four) times daily. 100 g 5  . ENBREL SURECLICK 50 MG/ML injection     . furosemide (LASIX) 40 MG tablet TAKE ONE (1) TABLET BY MOUTH EVERY DAY 30 tablet 11  . gabapentin (NEURONTIN) 600 MG tablet TAKE ONE (1) TABLET FOUR (4) TIMES PER DAY 120 tablet 11  . HYDROcodone-acetaminophen (NORCO) 10-325 MG tablet Take 1 tablet by mouth every 6 (six) hours as needed.    . insulin aspart (NOVOLOG) 100 UNIT/ML injection Inject into the skin 3 (three) times daily before meals. Sliding scale three times a day at mealtime    . Insulin Glargine (LANTUS) 100 UNIT/ML Solostar Pen Inject 65 Units into the skin daily at 10 pm. 45 mL 3  . Insulin Pen Needle 31G X 8 MM MISC 1 each by Does not apply route 3 (three) times daily. 100 each 11  . INVOKANA 300 MG TABS tablet TAKE ONE (1) TABLET EACH DAY 90 tablet 3  . ketoconazole (NIZORAL) 2 % cream Apply 1 application topically daily. 60 g 2  . levocetirizine ([name]) 5 MG tablet Take 1 tablet (5 mg total) by mouth every evening. 30 tablet 6  . montelukast (SINGULAIR) 10 MG tablet Take by mouth.    . niacin 500 MG tablet Take 500 mg by mouth at bedtime.    Marland Kitchen NOVOLOG FLEXPEN 100 UNIT/ML FlexPen USE AS DIRECTED PER SLIDING SCALE 100-150-5 UNITS, 151-200-7 UNITS, 201- 250-9 UNITS, 251-300-11 UNITS, 301-350- 13 UNITS, 351-400-15 UNITS 15 mL 11  . Omega-3 Fatty Acids (FISH OIL) 1000 MG CAPS Take by mouth 2 (two) times daily. Reported on 10/26/2015    . omeprazole (PRILOSEC) 20 MG capsule Take 1 capsule (20 mg total) by mouth 2 (two) times daily before a meal. 60 capsule 11  . polyethylene glycol powder (GLYCOLAX/MIRALAX) powder 255 grams one bottle for colonoscopy prep 255 g 0  . senna (SENOKOT) 8.6 MG tablet Take 1 tablet by mouth daily.    . sertraline (ZOLOFT) 100 MG tablet Take 1 tablet (100 mg total) by mouth daily. 90 tablet 3  . sertraline (ZOLOFT) 50 MG tablet 50 mg.    . simvastatin (ZOCOR) 40 MG tablet TAKE ONE (1) TABLET BY MOUTH EVERY DAY 90 tablet 3   . ULORIC 80 MG TABS Take 1 tablet by mouth every other day.    . VENTOLIN HFA 108 (90 Base) MCG/ACT inhaler INHALE TWO PUFFS EVERY 4-6 HOURS 18 g 3  . vitamin B-12 (CYANOCOBALAMIN) 500 MCG tablet Take 1,000 mcg by mouth daily.    . vitamin C (ASCORBIC ACID) 500 MG tablet Take 500 mg by mouth daily.     Current Facility-Administered Medications  Medication Dose Route Frequency Provider Last Rate Last Dose  . ipratropium-albuterol (DUONEB) 0.5-2.5 (  3) MG/3ML nebulizer solution 3 mL  3 mL Nebulization Q6H Amy Overton Mam, NP        Review of Systems Review of Systems  Constitutional: Negative.   Respiratory: Negative.   Cardiovascular: Negative.     Blood pressure 142/76, pulse 78, resp. rate 12, height 5\' 7"  (1.702 m), weight 201 lb (91.173 kg).  Physical Exam Physical Exam  Constitutional: He is oriented to person, place, and time. He appears well-developed and well-nourished.  Eyes: Conjunctivae are normal. No scleral icterus.  Neck: Neck supple.  Cardiovascular: Normal rate, regular rhythm and normal heart sounds.   Pulmonary/Chest: Effort normal and breath sounds normal.  Abdominal: Soft. Normal appearance and bowel sounds are normal. There is no tenderness. No hernia.  Lymphadenopathy:    He has no cervical adenopathy.  Neurological: He is alert and oriented to person, place, and time.  Skin: Skin is warm and dry.  On careful exam of of the inguinal regions with pt lying supine and standing no hernia is identified.  Data Reviewed Notes and ultrasound reviewed. [place] -questionable fat containing hernia  Assessment      Left groin pain-no detected hernia on exam. Discussed fully with pt. Plan to recheck in 1 mo. If there is still doubt will get a CT done    Plan      Patient to return in one month.   PCP:  [facility] This information has been scribed by Gaspar Cola CMA.  SANKAR,SEEPLAPUTHUR G 12/04/2015, 2:32 PM

## 2015-12-04 NOTE — Telephone Encounter (Signed)
Called to get clarification regarding dosing from pharmacy refills. He is taking lantus BID. And novolog TID. Will refill current dosing.  Also discussed urine drug screen- negative for Xanax- we cannot give it to him anymore. Pt understood. He will start weaning from Xanax and will discuss Buspar at his next visit.

## 2015-12-04 NOTE — Patient Instructions (Signed)
Patient to return in one month. 

## 2015-12-08 ENCOUNTER — Other Ambulatory Visit: Payer: Self-pay | Admitting: Family Medicine

## 2015-12-12 ENCOUNTER — Other Ambulatory Visit: Payer: Self-pay | Admitting: Family Medicine

## 2015-12-12 MED ORDER — INSULIN PEN NEEDLE 31G X 8 MM MISC: Status: DC

## 2015-12-17 ENCOUNTER — Ambulatory Visit (INDEPENDENT_AMBULATORY_CARE_PROVIDER_SITE_OTHER): Payer: Medicare Other | Admitting: Urology

## 2015-12-17 ENCOUNTER — Encounter: Payer: Self-pay | Admitting: Urology

## 2015-12-17 VITALS — BP 119/73 | HR 79 | Ht 67.0 in | Wt 194.0 lb

## 2015-12-17 DIAGNOSIS — L989 Disorder of the skin and subcutaneous tissue, unspecified: Secondary | ICD-10-CM

## 2015-12-17 DIAGNOSIS — N528 Other male erectile dysfunction: Secondary | ICD-10-CM | POA: Diagnosis not present

## 2015-12-17 DIAGNOSIS — E291 Testicular hypofunction: Secondary | ICD-10-CM | POA: Diagnosis not present

## 2015-12-17 DIAGNOSIS — N529 Male erectile dysfunction, unspecified: Secondary | ICD-10-CM

## 2015-12-17 DIAGNOSIS — N400 Enlarged prostate without lower urinary tract symptoms: Secondary | ICD-10-CM

## 2015-12-17 LAB — BLADDER SCAN AMB NON-IMAGING: Scan Result: 141

## 2015-12-17 MED ORDER — TAMSULOSIN HCL 0.4 MG PO CAPS: 0.4000 mg | ORAL_CAPSULE | Freq: Every day | ORAL | Status: AC

## 2015-12-17 NOTE — Progress Notes (Signed)
12/17/2015 10:51 AM   Colton Carter. 07-17-1945 [id number]  Referring provider: [name], NP [place], [place] [id number]  Chief Complaint  Patient presents with  . Hypogonadism    follow up    HPI: Patient is a 70 year old Caucasian male with history of hypogonadism, erectile dysfunction and BPH with LUTS who is referred by, Amy Overton Mam, NP, for further evaluation and management.  Hypogonadism Patient is experiencing a decrease in libido, a lack of energy, a decrease in strength, a loss in height, a decreased enjoyment in life, sadness and/or grumpiness, erections being less strong, a recent deterioration in an ability to play sports, falling asleep after dinner and a recent deterioration in their work performance.  This is indicated by his responses to the ADAM questionnaire.  A current morning serum testosterone level was found to be 318 ng/dL on 11/19/2015.  He was on testosterone cypionate in the past with Dr. Bernardo Heater, but it was discontinued by his nephrologist due to peripheral edema.  His [place], LH and prolactin were found to be normal in 2010.          Androgen Deficiency in the Aging Male      12/17/15 1000       Androgen Deficiency in the Aging Male   Do you have a decrease in libido (sex drive) Yes     Do you have lack of energy Yes     Do you have a decrease in strength and/or endurance Yes     Have you lost height Yes     Have you noticed a decreased "enjoyment of life" Yes     Are you sad and/or grumpy Yes     Are your erections less strong Yes     Have you noticed a recent deterioration in your ability to play sports Yes     Are you falling asleep after dinner Yes     Has there been a recent deterioration in your work performance Yes       Erectile dysfunction His SHIM score is 8, which is moderate erectile dysfunction.   He has been having difficulty with erections for over three years.   His major complaint is a decrease in the size of  his penis and lack of firmness with his erections.  His libido is diminished.   His risk factors for ED are hypogonadism, BPH, smoking, CAD, COPD, DM, HLD, CKD, chronic pain and polycythemia.  He denies any painful erections or curvatures with his erections.   He has tried PDE5-inhibitors in the past with limited success.       SHIM      12/17/15 1013       SHIM: Over the last 6 months:   How do you rate your confidence that you could get and keep an erection? Very Low     When you had erections with sexual stimulation, how often were your erections hard enough for penetration (entering your partner)? Almost Never or Never     During sexual intercourse, how often were you able to maintain your erection after you had penetrated (entered) your partner? Very Difficult     During sexual intercourse, how difficult was it to maintain your erection to completion of intercourse? Very Difficult     When you attempted sexual intercourse, how often was it satisfactory for you? Very Difficult     SHIM Total Score   SHIM 8  Score: 1-7 Severe ED 8-11 Moderate ED 12-16 Mild-Moderate ED 17-21 Mild ED 22-25 No ED  BPH WITH LUTS His IPSS score today is 29, which is severe lower urinary tract symptomatology. He is unhappy with his quality life due to his urinary symptoms. His PVR is 141 mL.   His major complaint today frequency, urgency, nocturia x 3, incontinence, intermittency, hesitancy and weak urinary stream.  He has had these symptoms for 8 or 9 months.  He denies any dysuria, hematuria or suprapubic pain.   He has had a biopsy of his prostate in 2007 due to a abnormal DRE.  The results were benign.  He also denies any recent fevers, chills, nausea or vomiting.  He does not have a family history of PCa.      IPSS      12/17/15 1000       International Prostate Symptom Score   How often have you had the sensation of not emptying your bladder? More than half the time     How often have you  had to urinate less than every two hours? Almost always     How often have you found you stopped and started again several times when you urinated? More than half the time     How often have you found it difficult to postpone urination? Almost always     How often have you had a weak urinary stream? Almost always     How often have you had to strain to start urination? About half the time     How many times did you typically get up at night to urinate? 3 Times     Total IPSS Score 29     Quality of Life due to urinary symptoms   If you were to spend the rest of your life with your urinary condition just the way it is now how would you feel about that? Unhappy        Score:  1-7 Mild 8-19 Moderate 20-35 Severe        PMH: Past Medical History  Diagnosis Date  . Coronary artery disease   . Diabetes mellitus   . Hypertension   . Arthritis   . High cholesterol   . Renal disorder   . Chronic neck pain   . Polycythemia, secondary 01/10/2015  . COPD (chronic obstructive pulmonary disease) ([place])   . Peripheral vascular disease ([place])   . Skin cancer 2014    nose  . Fatty liver   . Pancreatitis   . Diverticulosis   . Allergy   . Emphysema of lung ([place])   . Headache   . GERD (gastroesophageal reflux disease)   . Stomach ulcer     in past  . Obesity   . Personal history of tobacco use, presenting hazards to health 11/16/2015  . Hypogonadism in male   . ED (erectile dysfunction)   . Phimosis   . Chronic prostatitis   . CKD (chronic kidney disease)   . Urinary urgency   . BPH (benign prostatic hyperplasia)   . Urinary frequency     Surgical History: Past Surgical History  Procedure Laterality Date  . Cervical fusion    . Peripheral vascular catheterization N/A 02/06/2015    Procedure: Abdominal Aortogram w/Lower Extremity;  Surgeon: Katha Cabal, MD;  Location: [facility];  Service: Cardiovascular;  Laterality: N/A;  . Peripheral vascular catheterization   02/06/2015    Procedure: Lower Extremity Intervention;  Surgeon: Belenda Cruise  Eloise Levels, MD;  Location: [facility];  Service: Cardiovascular;;  . Peripheral vascular catheterization Left 03/20/2015    Procedure: Lower Extremity Angiography;  Surgeon: Katha Cabal, MD;  Location: [facility];  Service: Cardiovascular;  Laterality: Left;  . Peripheral vascular catheterization  03/20/2015    Procedure: Lower Extremity Intervention;  Surgeon: Katha Cabal, MD;  Location: [facility];  Service: Cardiovascular;;  . Colonoscopy  2011    Dr [name]  . Skin cancer excision  2014  . Upper gi endoscopy    . Colonoscopy with propofol N/A 07/11/2015    Procedure: COLONOSCOPY WITH PROPOFOL;  Surgeon: Christene Lye, MD;  Location: [facility];  Service: Endoscopy;  Laterality: N/A;  . Esophagogastroduodenoscopy (egd) with propofol N/A 07/11/2015    Procedure: ESOPHAGOGASTRODUODENOSCOPY (EGD) WITH PROPOFOL;  Surgeon: Christene Lye, MD;  Location: [facility];  Service: Endoscopy;  Laterality: N/A;  . Appendectomy  1980  . Cholecystectomy  2006    Home Medications:    Medication List       This list is accurate as of: 12/17/15 10:51 AM.  Always use your most recent med list.               ADVAIR DISKUS 250-50 MCG/DOSE Aepb  Generic drug:  Fluticasone-Salmeterol  INHALE ONE PUFF EVERY 12 HOURS     atenolol 25 MG tablet  Commonly known as:  TENORMIN  Take 1 tablet by mouth daily.     baclofen 10 MG tablet  Commonly known as:  LIORESAL  TAKE ONE TABLET BY MOUTH EVERY EIGHT HOURS     diclofenac sodium 1 % Gel  Commonly known as:  VOLTAREN  Apply 2 g topically 4 (four) times daily.     ENBREL SURECLICK 50 MG/ML injection  Generic drug:  etanercept     Fish Oil 1000 MG Caps  Take by mouth 2 (two) times daily. Reported on 10/26/2015     furosemide 40 MG tablet  Commonly known as:  LASIX  TAKE ONE (1) TABLET BY MOUTH EVERY DAY      gabapentin 600 MG tablet  Commonly known as:  NEURONTIN  TAKE ONE (1) TABLET FOUR (4) TIMES PER DAY     HYDROcodone-acetaminophen 10-325 MG tablet  Commonly known as:  NORCO  Take 1 tablet by mouth every 6 (six) hours as needed.     Insulin Glargine 100 UNIT/ML Solostar Pen  Commonly known as:  LANTUS  Inject 65 Units into the skin 2 (two) times daily.     Insulin Pen Needle 31G X 8 MM Misc  Commonly known as:  B-D ULTRAFINE III SHORT PEN  USE ONE EACH 5 TIMES PER DAY WITH INSULIN.     INVOKANA 300 MG Tabs tablet  Generic drug:  canagliflozin  TAKE ONE (1) TABLET EACH DAY     ketoconazole 2 % cream  Commonly known as:  NIZORAL  Apply 1 application topically daily.     levocetirizine 5 MG tablet  Commonly known as:  [name]  Take 1 tablet (5 mg total) by mouth every evening.     montelukast 10 MG tablet  Commonly known as:  SINGULAIR  Take by mouth.     niacin 500 MG tablet  Take 500 mg by mouth at bedtime.     NOVOLOG FLEXPEN 100 UNIT/ML FlexPen  Generic drug:  insulin aspart  USE AS DIRECTED PER SLIDING SCALE 100-150-5 UNITS, 151-200-7 UNITS, 201- 250-9 UNITS, 251-300-11 UNITS, 301-350-  13 UNITS, 351-400-15 UNITS     insulin aspart 100 UNIT/ML injection  Commonly known as:  novoLOG  Inject into skin per Sliding scale three times a day at mealtimes     omeprazole 20 MG capsule  Commonly known as:  PRILOSEC  Take 1 capsule (20 mg total) by mouth 2 (two) times daily before a meal.     polyethylene glycol powder powder  Commonly known as:  GLYCOLAX/MIRALAX  255 grams one bottle for colonoscopy prep     senna 8.6 MG tablet  Commonly known as:  SENOKOT  Take 1 tablet by mouth daily.     sertraline 100 MG tablet  Commonly known as:  ZOLOFT  Take 1 tablet (100 mg total) by mouth daily.     simvastatin 40 MG tablet  Commonly known as:  ZOCOR  TAKE ONE (1) TABLET BY MOUTH EVERY DAY     tamsulosin 0.4 MG Caps capsule  Commonly known as:  FLOMAX  Take 1 capsule  (0.4 mg total) by mouth daily.     ULORIC 80 MG Tabs  Generic drug:  Febuxostat  Take 1 tablet by mouth every other day.     VENTOLIN HFA 108 (90 Base) MCG/ACT inhaler  Generic drug:  albuterol  INHALE TWO PUFFS EVERY 4-6 HOURS     albuterol (2.5 MG/3ML) 0.083% nebulizer solution  Commonly known as:  PROVENTIL  Take 3 mLs (2.5 mg total) by nebulization every 6 (six) hours as needed for wheezing or shortness of breath.     vitamin B-12 500 MCG tablet  Commonly known as:  CYANOCOBALAMIN  Take 1,000 mcg by mouth daily.     vitamin C 500 MG tablet  Commonly known as:  ASCORBIC ACID  Take 500 mg by mouth daily.     Vitamin D-3 5000 UNITS Tabs  Take by mouth 2 (two) times daily.        Allergies:  Allergies  Allergen Reactions  . Levaquin [Levofloxacin In D5w] Other (See Comments)    Kidney shut down  . Milk-Related Compounds Nausea And Vomiting  . Septra Ds [Sulfamethoxazole-Trimethoprim]     Family History: Family History  Problem Relation Age of Onset  . Pancreatic cancer Father   . Cancer Father   . Stroke Father   . Heart disease Mother   . Diabetes Mother   . Depression Mother     Social History:  reports that he has been smoking Cigarettes.  He has a 12.5 pack-year smoking history. He has never used smokeless tobacco. He reports that he does not drink alcohol or use illicit drugs.  ROS: UROLOGY Frequent Urination?: Yes Hard to postpone urination?: Yes Burning/pain with urination?: No Get up at night to urinate?: Yes Leakage of urine?: Yes Urine stream starts and stops?: Yes Trouble starting stream?: Yes Do you have to strain to urinate?: No Blood in urine?: No Urinary tract infection?: No Sexually transmitted disease?: No Injury to kidneys or bladder?: No Painful intercourse?: No Weak stream?: Yes Erection problems?: Yes Penile pain?: Yes  Gastrointestinal Nausea?: Yes Vomiting?: Yes Indigestion/heartburn?: Yes Diarrhea?: No Constipation?:  No  Constitutional Fever: Yes Night sweats?: Yes Weight loss?: Yes Fatigue?: Yes  Skin Skin rash/lesions?: No Itching?: No  Eyes Blurred vision?: Yes Double vision?: Yes  Ears/Nose/Throat Sore throat?: Yes Sinus problems?: Yes  Hematologic/Lymphatic Swollen glands?: No Easy bruising?: Yes  Cardiovascular Leg swelling?: Yes Chest pain?: No  Respiratory Cough?: Yes Shortness of breath?: Yes  Endocrine Excessive thirst?: Yes  Musculoskeletal  Back pain?: Yes Joint pain?: Yes  Neurological Headaches?: Yes Dizziness?: Yes  Psychologic Depression?: Yes Anxiety?: Yes  Physical Exam: BP 119/73 mmHg  Pulse 79  Ht 5\' 7"  (1.702 m)  Wt 194 lb (87.998 kg)  BMI 30.38 kg/m2  Constitutional: Well nourished. Alert and oriented, No acute distress. HEENT: [place] AT, moist mucus membranes. Trachea midline, no masses. Cardiovascular: No clubbing, cyanosis, or edema. Respiratory: Normal respiratory effort, no increased work of breathing. GI: Abdomen is soft, non tender, non distended, no abdominal masses. Liver and spleen not palpable.  No hernias appreciated.  Stool sample for occult testing is not indicated.   GU: No CVA tenderness.  No bladder fullness or masses.  Patient with circumcised phallus.   Urethral meatus is patent.  No penile discharge. No penile lesions or rashes. Scrotum without lesions, cysts, rashes and/or edema.  Testicles are located scrotally bilaterally. No masses are appreciated in the testicles. Left and right epididymis are normal.  5 mm x 5 mm lesion in the left inguinal fold.  Patient states it is new.   Rectal: Patient with  normal sphincter tone. Anus and perineum without scarring or rashes. No rectal masses are appreciated. Prostate is approximately 45 grams, irregular, no nodules are appreciated. Seminal vesicles are normal. Skin: No rashes, bruises or suspicious lesions. Lymph: No cervical or inguinal adenopathy. Neurologic: Grossly intact, no  focal deficits, moving all 4 extremities. Psychiatric: Normal mood and affect.  Laboratory Data: Lab Results  Component Value Date   WBC 8.9 06/27/2015   HGB 16.6 10/26/2015   HCT 48.8 10/26/2015   MCV 91.4 06/27/2015   PLT 121* 06/27/2015    Lab Results  Component Value Date   CREATININE 1.30* 11/19/2015     Lab Results  Component Value Date   TESTOSTERONE 318* 11/19/2015    Lab Results  Component Value Date   HGBA1C 7.5 10/11/2015    Lab Results  Component Value Date   TSH 1.70 07/15/2011       Component Value Date/Time   CHOL 124 11/19/2015 0810   CHOL 92 07/15/2011 0528   HDL 38* 11/19/2015 0810   HDL 20* 07/15/2011 0528   CHOLHDL 3.3 11/19/2015 0810   VLDL 43* 07/15/2011 0528   LDLCALC 51 11/19/2015 0810   LDLCALC 29 07/15/2011 0528    Lab Results  Component Value Date   AST 15 11/19/2015   Lab Results  Component Value Date   ALT 14 11/19/2015   Pertinent imaging Results for Colton Carter, Colton Carter (MRN [id number]) as of 12/17/2015 12:47  Ref. Range 12/17/2015 10:39  Scan Result Unknown 141    Assessment & Plan:    1. Hypogonadism:   I have advised the patient to table the issue of hypogonadism at this time due to his other co-morbidities.  He states he is more concerned about his erections and kidney function at this time.  If he decides to pursue this in the future, he would need another morning testosterone to confirm the diagnosis.  I would also recommend getting cardiac clearance if he decides to start therapy.    2. Erectile dysfunction:   SHIM score is 8.   I explained to the patient that in order to achieve an erection it takes good functioning of the nervous system (parasympathetic, sympathetic, sensory and motor), good blood flow into the erectile tissue of the penis and a desire to have sex.   I stated that conditions like diabetes, hypertension, coronary artery disease, peripheral vascular disease,  smoking, alcohol consumption, age and BPH can  diminish the ability to have an erection.   He would like to check with his nephrologist at this time concerning his kidney function.  He will be returning in one month.  3. BPH with LUTS:   Patient's IPSS score is 29/5.  His PVR 141 mL.   I discussed the treatment options for BPH, such as: observation over time with prn avoidance of alcohol/caffeine; medical treatment or TURP.   Patient like to pursue medical treatment.  I will start tamsulosin 0.4 mg daily.  He is advised to take the tamsulosin 30 minutes after a meal.  I advised him of the side effects, such as: retrograde ejaculation, sinus congestion, nasal congestion, rhinorrhea, rhinitis, dizziness, and seasonal allergic rhinitis.   He will follow up in one month for a PVR and an IPSS.    4. Suspicious skin lesion:   Will refer to dermatology for further evaluation.    Return in about 1 month (around 01/16/2016) for IPSS score and PVR.  These notes generated with voice recognition software. I apologize for typographical errors.  Zara Council, [facility] [address], [place], [place] [address] [phone]

## 2015-12-18 ENCOUNTER — Ambulatory Visit
Admission: RE | Admit: 2015-12-18 | Discharge: 2015-12-18 | Disposition: A | Discharge status: Home / Self Care | Payer: Medicare Other | Source: Ambulatory Visit | Attending: Family Medicine | Admitting: Family Medicine

## 2015-12-18 DIAGNOSIS — R161 Splenomegaly, not elsewhere classified: Secondary | ICD-10-CM | POA: Insufficient documentation

## 2015-12-18 DIAGNOSIS — R918 Other nonspecific abnormal finding of lung field: Secondary | ICD-10-CM | POA: Insufficient documentation

## 2015-12-18 DIAGNOSIS — I2510 Atherosclerotic heart disease of native coronary artery without angina pectoris: Secondary | ICD-10-CM | POA: Insufficient documentation

## 2015-12-18 DIAGNOSIS — Z87891 Personal history of nicotine dependence: Secondary | ICD-10-CM | POA: Diagnosis not present

## 2015-12-18 DIAGNOSIS — I700 Atherosclerosis of aorta: Secondary | ICD-10-CM | POA: Insufficient documentation

## 2015-12-19 ENCOUNTER — Ambulatory Visit (INDEPENDENT_AMBULATORY_CARE_PROVIDER_SITE_OTHER): Payer: Medicare Other | Admitting: Family Medicine

## 2015-12-19 VITALS — BP 139/80 | HR 62 | Temp 98.1°F | Resp 16 | Ht 67.0 in | Wt 195.0 lb

## 2015-12-19 DIAGNOSIS — G8929 Other chronic pain: Secondary | ICD-10-CM

## 2015-12-19 DIAGNOSIS — F329 Major depressive disorder, single episode, unspecified: Secondary | ICD-10-CM

## 2015-12-19 DIAGNOSIS — F418 Other specified anxiety disorders: Secondary | ICD-10-CM | POA: Diagnosis not present

## 2015-12-19 DIAGNOSIS — E1142 Type 2 diabetes mellitus with diabetic polyneuropathy: Secondary | ICD-10-CM

## 2015-12-19 DIAGNOSIS — M542 Cervicalgia: Secondary | ICD-10-CM | POA: Diagnosis not present

## 2015-12-19 DIAGNOSIS — F419 Anxiety disorder, unspecified: Principal | ICD-10-CM

## 2015-12-19 DIAGNOSIS — F13230 Sedative, hypnotic or anxiolytic dependence with withdrawal, uncomplicated: Secondary | ICD-10-CM

## 2015-12-19 DIAGNOSIS — F13930 Sedative, hypnotic or anxiolytic use, unspecified with withdrawal, uncomplicated: Secondary | ICD-10-CM

## 2015-12-19 MED ORDER — BUSPIRONE HCL 5 MG PO TABS: 5.0000 mg | ORAL_TABLET | Freq: Two times a day (BID) | ORAL | Status: DC

## 2015-12-19 MED ORDER — CAPSAICIN 0.025 % EX CREA: TOPICAL_CREAM | Freq: Two times a day (BID) | CUTANEOUS | Status: DC

## 2015-12-19 MED ORDER — ALPHA-LIPOIC ACID 600 MG PO CAPS: 1.0000 | ORAL_CAPSULE | Freq: Every day | ORAL | Status: AC
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
## 2015-12-19 NOTE — Assessment & Plan Note (Signed)
Will plan to address when patient anxiety is stable. Add alpha lipoic acid and capsaicin cream to help with foot pain at this time. Consider adding lyrica in place of gabapentin for pain control. Recheck 2 weeks.

## 2015-12-19 NOTE — Patient Instructions (Addendum)
Let's try Buspar to help with your anxiety. Take 5mg  tablets twice daily for two weeks and we will increase your dose.   I have placed a referral to [facility] pain management for you today.

## 2015-12-19 NOTE — Assessment & Plan Note (Signed)
Continue Zoloft- add low dose buspar to help with symptoms of anxiety. 5 mg BID. Increase slowly 2/2 kidney function.

## 2015-12-19 NOTE — Assessment & Plan Note (Signed)
Pt would like to change pain providers from [facility] to [facility]. Referral placed today.

## 2015-12-26 ENCOUNTER — Inpatient Hospital Stay: Payer: Medicare Other | Attending: Internal Medicine

## 2015-12-26 ENCOUNTER — Inpatient Hospital Stay: Payer: Medicare Other

## 2015-12-26 DIAGNOSIS — D649 Anemia, unspecified: Secondary | ICD-10-CM | POA: Diagnosis not present

## 2015-12-26 DIAGNOSIS — D751 Secondary polycythemia: Secondary | ICD-10-CM

## 2015-12-26 LAB — HEMOGLOBIN: HEMOGLOBIN: 16.4 g/dL (ref 13.0–18.0)

## 2015-12-26 LAB — HEMATOCRIT: HCT: 48.5 % (ref 40.0–52.0)

## 2015-12-31 ENCOUNTER — Other Ambulatory Visit: Payer: Self-pay | Admitting: Family Medicine

## 2016-01-01 ENCOUNTER — Ambulatory Visit (INDEPENDENT_AMBULATORY_CARE_PROVIDER_SITE_OTHER): Payer: Medicare Other | Admitting: Family Medicine

## 2016-01-01 VITALS — BP 128/65 | HR 72 | Temp 98.2°F | Resp 16 | Ht 67.0 in | Wt 196.0 lb

## 2016-01-01 DIAGNOSIS — F419 Anxiety disorder, unspecified: Principal | ICD-10-CM

## 2016-01-01 DIAGNOSIS — I10 Essential (primary) hypertension: Secondary | ICD-10-CM | POA: Diagnosis not present

## 2016-01-01 DIAGNOSIS — J432 Centrilobular emphysema: Secondary | ICD-10-CM

## 2016-01-01 DIAGNOSIS — F418 Other specified anxiety disorders: Secondary | ICD-10-CM

## 2016-01-01 DIAGNOSIS — F329 Major depressive disorder, single episode, unspecified: Secondary | ICD-10-CM

## 2016-01-01 MED ORDER — BUSPIRONE HCL 7.5 MG PO TABS: 7.5000 mg | ORAL_TABLET | Freq: Two times a day (BID) | ORAL | Status: DC

## 2016-01-01 MED ORDER — MONTELUKAST SODIUM 10 MG PO TABS: 10.0000 mg | ORAL_TABLET | Freq: Every day | ORAL | Status: AC

## 2016-01-01 MED ORDER — ATENOLOL 25 MG PO TABS: 25.0000 mg | ORAL_TABLET | Freq: Every day | ORAL | Status: AC

## 2016-01-01 NOTE — Assessment & Plan Note (Signed)
Increase Buspar to 7.5mg  twice daily to help with anxiety. Discussed with patient options for his current mood. He might benefit from counseling on psychiatry consult. Given self referral information for [facility]- pt states he has been there before.  Recheck 4 weeks.

## 2016-01-01 NOTE — Assessment & Plan Note (Signed)
Meds renewed.

## 2016-01-01 NOTE — Progress Notes (Signed)
Subjective:    Patient ID: Colton Rainwater., male    DOB: 03-Sep-1945, 70 y.o.   MRN: [id number]  HPI: Colton Peregrino. is a 70 y.o. male presenting on 01/01/2016 for Anxiety   HPI  Pt presents for anxiety recheck. His xanax was stopped cold [place] by his pain management. He is struggling with anxiety- feels very short and abrupt. Was started on Buspar- taking 1 AM, 1 evening, and zoloft at bedtime. Feels like he is crawling out of his skin. His pain clinic has a psychiatrist- he has seen him once in the past. Had [facility] evaluation- was doing well. Next appt is July 25 at [facility]- would like to see psychiatry.  Lung CT came good- still has nodules. Did find atherosclerosis- but he is monitored by cardiology.  Smoking 5 cigarettes per day. Is trying to quit.  Diabetes: Had snack at night. Had some low sugars. Trying to keep sugar down.    Past Medical History  Diagnosis Date  . Coronary artery disease   . Diabetes mellitus   . Hypertension   . Arthritis   . High cholesterol   . Renal disorder   . Chronic neck pain   . Polycythemia, secondary 01/10/2015  . COPD (chronic obstructive pulmonary disease) ([place])   . Peripheral vascular disease ([place])   . Skin cancer 2014    nose  . Fatty liver   . Pancreatitis   . Diverticulosis   . Allergy   . Emphysema of lung ([place])   . Headache   . GERD (gastroesophageal reflux disease)   . Stomach ulcer     in past  . Obesity   . Personal history of tobacco use, presenting hazards to health 11/16/2015  . Hypogonadism in male   . ED (erectile dysfunction)   . Phimosis   . Chronic prostatitis   . CKD (chronic kidney disease)   . Urinary urgency   . BPH (benign prostatic hyperplasia)   . Urinary frequency     Current Outpatient Prescriptions on File Prior to Visit  Medication Sig  . ADVAIR DISKUS 250-50 MCG/DOSE AEPB INHALE ONE PUFF EVERY 12 HOURS  . albuterol (PROVENTIL) (2.5 MG/3ML) 0.083% nebulizer solution Take 3 mLs (2.5 mg total) by  nebulization every 6 (six) hours as needed for wheezing or shortness of breath.  . Alpha-Lipoic Acid 600 MG CAPS Take 1 capsule (600 mg total) by mouth daily.  . baclofen (LIORESAL) 10 MG tablet TAKE ONE TABLET BY MOUTH EVERY EIGHT HOURS  . capsaicin (ZOSTRIX) 0.025 % cream Apply topically 2 (two) times daily. To feet.  . Cholecalciferol (VITAMIN D-3) 5000 UNITS TABS Take by mouth 2 (two) times daily.  . diclofenac sodium (VOLTAREN) 1 % GEL Apply 2 g topically 4 (four) times daily.  Colton Carter SURECLICK 50 MG/ML injection   . furosemide (LASIX) 40 MG tablet TAKE ONE (1) TABLET BY MOUTH EVERY DAY  . gabapentin (NEURONTIN) 600 MG tablet TAKE ONE (1) TABLET FOUR (4) TIMES PER DAY  . HYDROcodone-acetaminophen (NORCO) 10-325 MG tablet Take 1 tablet by mouth every 6 (six) hours as needed.  . insulin aspart (NOVOLOG) 100 UNIT/ML injection Inject into skin per Sliding scale three times a day at mealtimes  . Insulin Glargine (LANTUS) 100 UNIT/ML Solostar Pen Inject 65 Units into the skin 2 (two) times daily.  . Insulin Pen Needle (B-D ULTRAFINE III SHORT PEN) 31G X 8 MM MISC USE ONE EACH 5 TIMES PER DAY WITH INSULIN.  Marland Kitchen  INVOKANA 300 MG TABS tablet TAKE ONE (1) TABLET EACH DAY  . ketoconazole (NIZORAL) 2 % cream Apply 1 application topically daily.  Marland Kitchen levocetirizine ([name]) 5 MG tablet Take 1 tablet (5 mg total) by mouth every evening.  . niacin 500 MG tablet Take 500 mg by mouth at bedtime.  Marland Kitchen NOVOLOG FLEXPEN 100 UNIT/ML FlexPen USE AS DIRECTED PER SLIDING SCALE 100-150-5 UNITS, 151-200-7 UNITS, 201- 250-9 UNITS, 251-300-11 UNITS, 301-350- 13 UNITS, 351-400-15 UNITS  . Omega-3 Fatty Acids (FISH OIL) 1000 MG CAPS Take by mouth 2 (two) times daily. Reported on 10/26/2015  . omeprazole (PRILOSEC) 20 MG capsule Take 1 capsule (20 mg total) by mouth 2 (two) times daily before a meal.  . polyethylene glycol powder (GLYCOLAX/MIRALAX) powder 255 grams one bottle for colonoscopy prep  . senna (SENOKOT) 8.6 MG tablet  Take 1 tablet by mouth daily.  . sertraline (ZOLOFT) 100 MG tablet Take 1 tablet (100 mg total) by mouth daily.  . simvastatin (ZOCOR) 40 MG tablet TAKE ONE (1) TABLET BY MOUTH EVERY DAY  . tamsulosin (FLOMAX) 0.4 MG CAPS capsule Take 1 capsule (0.4 mg total) by mouth daily.  Marland Kitchen ULORIC 80 MG TABS Take 1 tablet by mouth every other day.  . VENTOLIN HFA 108 (90 Base) MCG/ACT inhaler INHALE TWO PUFFS EVERY 4-6 HOURS  . vitamin B-12 (CYANOCOBALAMIN) 500 MCG tablet Take 1,000 mcg by mouth daily.  . vitamin C (ASCORBIC ACID) 500 MG tablet Take 500 mg by mouth daily.   Current Facility-Administered Medications on File Prior to Visit  Medication  . ipratropium-albuterol (DUONEB) 0.5-2.5 (3) MG/3ML nebulizer solution 3 mL    Review of Systems  Constitutional: Negative for fever and chills.  HENT: Negative.   Respiratory: Negative for chest tightness, shortness of breath and wheezing.   Cardiovascular: Negative for chest pain, palpitations and leg swelling.  Gastrointestinal: Negative for nausea, vomiting and abdominal pain.  Endocrine: Negative.   Genitourinary: Negative for dysuria, urgency, discharge, penile pain and testicular pain.  Musculoskeletal: Negative for back pain, joint swelling and arthralgias.  Skin: Negative.   Neurological: Negative for dizziness, weakness, numbness and headaches.  Psychiatric/Behavioral: Positive for sleep disturbance and dysphoric mood. Negative for suicidal ideas. The patient is nervous/anxious.    Per HPI unless specifically indicated above     Objective:    BP 128/65 mmHg  Pulse 72  Temp(Src) 98.2 F (36.8 C) (Oral)  Resp 16  Ht 5\' 7"  (1.702 m)  Wt 196 lb (88.905 kg)  BMI 30.69 kg/m2  Wt Readings from Last 3 Encounters:  01/01/16 196 lb (88.905 kg)  12/19/15 195 lb (88.451 kg)  12/18/15 194 lb (87.998 kg)    Physical Exam  Constitutional: He is oriented to person, place, and time. He appears well-developed and well-nourished. No distress.    HENT:  Head: Normocephalic and atraumatic.  Neck: Neck supple. No thyromegaly present.  Cardiovascular: Normal rate, regular rhythm and normal heart sounds.  Exam reveals no gallop and no friction rub.   No murmur heard. Pulmonary/Chest: Effort normal and breath sounds normal. He has no wheezes.  Abdominal: Soft. Bowel sounds are normal. He exhibits no distension. There is no tenderness. There is no rebound.  Musculoskeletal: Normal range of motion. He exhibits no edema or tenderness.  Neurological: He is alert and oriented to person, place, and time. He has normal reflexes.  Skin: Skin is warm and dry. No rash noted. No erythema.  Psychiatric: His behavior is normal. Judgment and thought content normal.  He exhibits a depressed mood. He expresses no suicidal ideation. He expresses no suicidal plans.   Results for orders placed or performed in visit on 12/26/15  Hematocrit [facility])  Result Value Ref Range   HCT 48.5 40.0 - 52.0 %  Hemoglobin ([facility])  Result Value Ref Range   Hemoglobin 16.4 13.0 - 18.0 g/dL      Assessment & Plan:   Problem List Items Addressed This Visit      Cardiovascular and Mediastinum   Essential hypertension    Controlled. Meds renewed. Last BMP showed CKD stage 3.       Relevant Medications   atenolol (TENORMIN) 25 MG tablet     Respiratory   COPD (chronic obstructive pulmonary disease) (HCC) (Chronic)    Meds renewed.       Relevant Medications   predniSONE (DELTASONE) 5 MG tablet   montelukast (SINGULAIR) 10 MG tablet     Other   Anxiety and depression - Primary    Increase Buspar to 7.5mg  twice daily to help with anxiety. Discussed with patient options for his current mood. He might benefit from counseling on psychiatry consult. Given self referral information for [facility]- pt states he has been there before.  Recheck 4 weeks.       Relevant Medications   busPIRone (BUSPAR) 7.5 MG tablet      Meds ordered this encounter  Medications  .  predniSONE (DELTASONE) 5 MG tablet    Sig:   . busPIRone (BUSPAR) 7.5 MG tablet    Sig: Take 1 tablet (7.5 mg total) by mouth 2 (two) times daily.    Dispense:  60 tablet    Refill:  11    Order Specific Question:  Supervising Provider    Answer:  Arlis Porta [phone]  . atenolol (TENORMIN) 25 MG tablet    Sig: Take 1 tablet (25 mg total) by mouth daily.    Dispense:  90 tablet    Refill:  3    Order Specific Question:  Supervising Provider    Answer:  Arlis Porta [phone]  . montelukast (SINGULAIR) 10 MG tablet    Sig: Take 1 tablet (10 mg total) by mouth at bedtime.    Dispense:  90 tablet    Refill:  3    Order Specific Question:  Supervising Provider    Answer:  Arlis Porta [id number]      Follow up plan: Return in about 1 month (around 01/31/2016) for Diabetes and anxiety. Marland Kitchen

## 2016-01-01 NOTE — Patient Instructions (Signed)
Let's try increasing your Buspar to 7.5mg  twice daily. Take as you were taking at home. I do recommend speaking with [facility]ard can call them [phone] or walk into [facility] M-F 9am-4pm- please call ahead to ensure they accept your insurance.

## 2016-01-01 NOTE — Assessment & Plan Note (Signed)
Controlled. Meds renewed. Last BMP showed CKD stage 3.

## 2016-01-02 ENCOUNTER — Ambulatory Visit (INDEPENDENT_AMBULATORY_CARE_PROVIDER_SITE_OTHER): Payer: Medicare Other | Admitting: General Surgery

## 2016-01-02 ENCOUNTER — Encounter: Payer: Self-pay | Admitting: General Surgery

## 2016-01-02 VITALS — BP 116/64 | HR 78 | Resp 14 | Ht 67.0 in | Wt 197.0 lb

## 2016-01-02 DIAGNOSIS — R1030 Lower abdominal pain, unspecified: Secondary | ICD-10-CM

## 2016-01-02 DIAGNOSIS — R1032 Left lower quadrant pain: Secondary | ICD-10-CM

## 2016-01-02 NOTE — Patient Instructions (Addendum)
The patient is aware to call back for any questions or concerns. CT scan of abdomen and pelvis to be scheduled.   Patient has been scheduled for a CT abdomen/pelvis with contrast at [facility] for 01-07-16 at 10:30 am (arrive 10:15 am). Prep: NPO 4 hours prior and pick up prep kit. Patient verbalizes understanding.

## 2016-01-02 NOTE — Progress Notes (Signed)
Patient ID: Colton Carter., male   DOB: 1946-05-17, 70 y.o.   MRN: [id number]  Chief Complaint  Patient presents with  . Follow-up    groin pain    HPI Colton Carter. is a 70 y.o. male here for a one month follow-up from left groin pain possibly related to a hernia. He states the pain is about the same as before and it feels heavy. Walking and leaning forward cause pain.  I have reviewed the history of present illness with the patient.  HPI  Past Medical History  Diagnosis Date  . Coronary artery disease   . Diabetes mellitus   . Hypertension   . Arthritis   . High cholesterol   . Renal disorder   . Chronic neck pain   . Polycythemia, secondary 01/10/2015  . COPD (chronic obstructive pulmonary disease) ([place])   . Peripheral vascular disease ([place])   . Skin cancer 2014    nose  . Fatty liver   . Pancreatitis   . Diverticulosis   . Allergy   . Emphysema of lung ([place])   . Headache   . GERD (gastroesophageal reflux disease)   . Stomach ulcer     in past  . Obesity   . Personal history of tobacco use, presenting hazards to health 11/16/2015  . Hypogonadism in male   . ED (erectile dysfunction)   . Phimosis   . Chronic prostatitis   . CKD (chronic kidney disease)   . Urinary urgency   . BPH (benign prostatic hyperplasia)   . Urinary frequency     Past Surgical History  Procedure Laterality Date  . Cervical fusion    . Peripheral vascular catheterization N/A 02/06/2015    Procedure: Abdominal Aortogram w/Lower Extremity;  Surgeon: Katha Cabal, MD;  Location: [facility];  Service: Cardiovascular;  Laterality: N/A;  . Peripheral vascular catheterization  02/06/2015    Procedure: Lower Extremity Intervention;  Surgeon: Katha Cabal, MD;  Location: [facility];  Service: Cardiovascular;;  . Peripheral vascular catheterization Left 03/20/2015    Procedure: Lower Extremity Angiography;  Surgeon: Katha Cabal, MD;  Location: [facility];   Service: Cardiovascular;  Laterality: Left;  . Peripheral vascular catheterization  03/20/2015    Procedure: Lower Extremity Intervention;  Surgeon: Katha Cabal, MD;  Location: [facility];  Service: Cardiovascular;;  . Colonoscopy  2011    Dr [name]  . Skin cancer excision  2014  . Upper gi endoscopy    . Colonoscopy with propofol N/A 07/11/2015    Procedure: COLONOSCOPY WITH PROPOFOL;  Surgeon: Christene Lye, MD;  Location: [facility];  Service: Endoscopy;  Laterality: N/A;  . Esophagogastroduodenoscopy (egd) with propofol N/A 07/11/2015    Procedure: ESOPHAGOGASTRODUODENOSCOPY (EGD) WITH PROPOFOL;  Surgeon: Christene Lye, MD;  Location: [facility];  Service: Endoscopy;  Laterality: N/A;  . Appendectomy  1980  . Cholecystectomy  2006    Family History  Problem Relation Age of Onset  . Pancreatic cancer Father   . Cancer Father   . Stroke Father   . Heart disease Mother   . Diabetes Mother   . Depression Mother     Social History Social History  Substance Use Topics  . Smoking status: Current Every Day Smoker -- 0.25 packs/day for 50 years    Types: Cigarettes  . Smokeless tobacco: Never Used     Comment: 5 cigarettes a day  . Alcohol Use:  No    Allergies  Allergen Reactions  . Levaquin [Levofloxacin In D5w] Other (See Comments)    Kidney shut down  . Milk-Related Compounds Nausea And Vomiting  . Septra Ds [Sulfamethoxazole-Trimethoprim]     Current Outpatient Prescriptions  Medication Sig Dispense Refill  . ADVAIR DISKUS 250-50 MCG/DOSE AEPB INHALE ONE PUFF EVERY 12 HOURS 60 each 11  . albuterol (PROVENTIL) (2.5 MG/3ML) 0.083% nebulizer solution Take 3 mLs (2.5 mg total) by nebulization every 6 (six) hours as needed for wheezing or shortness of breath. 150 mL 1  . Alpha-Lipoic Acid 600 MG CAPS Take 1 capsule (600 mg total) by mouth daily. 30 each 11  . atenolol (TENORMIN) 25 MG tablet Take 1 tablet (25 mg total) by mouth daily.  90 tablet 3  . baclofen (LIORESAL) 10 MG tablet TAKE ONE TABLET BY MOUTH EVERY EIGHT HOURS 90 each 2  . busPIRone (BUSPAR) 7.5 MG tablet Take 1 tablet (7.5 mg total) by mouth 2 (two) times daily. 60 tablet 11  . capsaicin (ZOSTRIX) 0.025 % cream Apply topically 2 (two) times daily. To feet. 60 g 11  . Cholecalciferol (VITAMIN D-3) 5000 UNITS TABS Take by mouth 2 (two) times daily.    . diclofenac sodium (VOLTAREN) 1 % GEL Apply 2 g topically 4 (four) times daily. 100 g 5  . ENBREL SURECLICK 50 MG/ML injection     . furosemide (LASIX) 40 MG tablet TAKE ONE (1) TABLET BY MOUTH EVERY DAY 30 tablet 11  . gabapentin (NEURONTIN) 600 MG tablet TAKE ONE (1) TABLET FOUR (4) TIMES PER DAY 120 tablet 11  . HYDROcodone-acetaminophen (NORCO) 10-325 MG tablet Take 1 tablet by mouth every 6 (six) hours as needed.    . insulin aspart (NOVOLOG) 100 UNIT/ML injection Inject into skin per Sliding scale three times a day at mealtimes 10 mL 11  . Insulin Glargine (LANTUS) 100 UNIT/ML Solostar Pen Inject 65 Units into the skin 2 (two) times daily. 45 mL 3  . Insulin Pen Needle (B-D ULTRAFINE III SHORT PEN) 31G X 8 MM MISC USE ONE EACH 5 TIMES PER DAY WITH INSULIN. 100 each 11  . INVOKANA 300 MG TABS tablet TAKE ONE (1) TABLET EACH DAY 90 tablet 3  . ketoconazole (NIZORAL) 2 % cream Apply 1 application topically daily. 60 g 2  . levocetirizine ([name]) 5 MG tablet Take 1 tablet (5 mg total) by mouth every evening. 30 tablet 6  . montelukast (SINGULAIR) 10 MG tablet Take 1 tablet (10 mg total) by mouth at bedtime. 90 tablet 3  . niacin 500 MG tablet Take 500 mg by mouth at bedtime.    Marland Kitchen NOVOLOG FLEXPEN 100 UNIT/ML FlexPen USE AS DIRECTED PER SLIDING SCALE 100-150-5 UNITS, 151-200-7 UNITS, 201- 250-9 UNITS, 251-300-11 UNITS, 301-350- 13 UNITS, 351-400-15 UNITS 15 mL 11  . Omega-3 Fatty Acids (FISH OIL) 1000 MG CAPS Take by mouth 2 (two) times daily. Reported on 10/26/2015    . omeprazole (PRILOSEC) 20 MG capsule Take 1  capsule (20 mg total) by mouth 2 (two) times daily before a meal. 60 capsule 11  . polyethylene glycol powder (GLYCOLAX/MIRALAX) powder 255 grams one bottle for colonoscopy prep 255 g 0  . predniSONE (DELTASONE) 5 MG tablet     . senna (SENOKOT) 8.6 MG tablet Take 1 tablet by mouth daily.    . sertraline (ZOLOFT) 100 MG tablet Take 1 tablet (100 mg total) by mouth daily. 90 tablet 3  . simvastatin (ZOCOR) 40 MG tablet TAKE  ONE (1) TABLET BY MOUTH EVERY DAY 90 tablet 3  . tamsulosin (FLOMAX) 0.4 MG CAPS capsule Take 1 capsule (0.4 mg total) by mouth daily. 90 capsule 3  . ULORIC 80 MG TABS Take 1 tablet by mouth every other day.    . VENTOLIN HFA 108 (90 Base) MCG/ACT inhaler INHALE TWO PUFFS EVERY 4-6 HOURS 18 g 3  . vitamin B-12 (CYANOCOBALAMIN) 500 MCG tablet Take 1,000 mcg by mouth daily.    . vitamin C (ASCORBIC ACID) 500 MG tablet Take 500 mg by mouth daily.     Current Facility-Administered Medications  Medication Dose Route Frequency Provider Last Rate Last Dose  . ipratropium-albuterol (DUONEB) 0.5-2.5 (3) MG/3ML nebulizer solution 3 mL  3 mL Nebulization Q6H Amy Lauren Krebs, NP        Review of Systems Review of Systems  Constitutional: Negative.   Respiratory: Negative.   Cardiovascular: Negative.   Neurological: Positive for headaches.    Blood pressure 116/64, pulse 78, resp. rate 14, height _0  (1.702 m), weight 197 lb (89.359 kg).  Physical Exam Physical Exam  Constitutional: He is oriented to person, place, and time. He appears well-developed and well-nourished.  Abdominal: Soft. Normal appearance and bowel sounds are normal. He exhibits no distension. There is tenderness (left groin ). Hernia confirmed negative in the right inguinal area and confirmed negative in the left inguinal area.  No hernia felt on palpation  Neurological: He is alert and oriented to person, place, and time.  Skin: Skin is warm and dry.  Psychiatric: His behavior is normal.    Data  Reviewed Prior notes   Assessment    Left groin pain     Plan    Obtain CT scan of abdomen and pelvis for further evaluation.   Patient has been scheduled for a CT abdomen/pelvis with contrast at [facility] for 01-07-16 at 10:30 am (arrive 10:15 am). Prep: NPO 4 hours prior and pick up prep kit. Patient verbalizes understanding.     PCP: Vincenza Hews, Amy This information has been scribed by Karie Fetch RN, BSN,BC.     Leopold Smyers G 01/02/2016, 5:50 PM

## 2016-01-07 ENCOUNTER — Ambulatory Visit
Admission: RE | Admit: 2016-01-07 | Discharge: 2016-01-07 | Disposition: A | Discharge status: Home / Self Care | Payer: Medicare Other | Source: Ambulatory Visit | Attending: General Surgery | Admitting: General Surgery

## 2016-01-07 DIAGNOSIS — K4020 Bilateral inguinal hernia, without obstruction or gangrene, not specified as recurrent: Secondary | ICD-10-CM | POA: Insufficient documentation

## 2016-01-07 DIAGNOSIS — R1030 Lower abdominal pain, unspecified: Secondary | ICD-10-CM | POA: Diagnosis present

## 2016-01-07 DIAGNOSIS — R1032 Left lower quadrant pain: Secondary | ICD-10-CM

## 2016-01-07 HISTORY — DX: Systemic involvement of connective tissue, unspecified: M35.9

## 2016-01-07 LAB — POCT I-STAT CREATININE: CREATININE: 1.5 mg/dL — AB (ref 0.61–1.24)

## 2016-01-07 MED ORDER — IOPAMIDOL (ISOVUE-370) INJECTION 76%
75.0000 mL | Freq: Once | INTRAVENOUS | Status: AC | PRN
  Administered 2016-01-07: 75 mL via INTRAVENOUS

## 2016-01-09 ENCOUNTER — Telehealth: Payer: Self-pay | Admitting: *Deleted

## 2016-01-14 ENCOUNTER — Ambulatory Visit: Payer: Medicare Other | Admitting: Urology

## 2016-01-29 ENCOUNTER — Ambulatory Visit: Payer: Medicare Other | Admitting: General Surgery

## 2016-01-30 ENCOUNTER — Ambulatory Visit: Payer: Medicare Other | Admitting: Urology

## 2016-01-31 ENCOUNTER — Other Ambulatory Visit: Payer: Self-pay | Admitting: Gastroenterology

## 2016-02-04 ENCOUNTER — Encounter: Payer: Self-pay | Admitting: General Surgery

## 2016-02-04 ENCOUNTER — Ambulatory Visit (INDEPENDENT_AMBULATORY_CARE_PROVIDER_SITE_OTHER): Payer: Medicare Other | Admitting: General Surgery

## 2016-02-04 VITALS — BP 122/68 | HR 76 | Resp 14 | Ht 69.0 in | Wt 195.0 lb

## 2016-02-04 DIAGNOSIS — R1032 Left lower quadrant pain: Secondary | ICD-10-CM

## 2016-02-04 DIAGNOSIS — R1030 Lower abdominal pain, unspecified: Secondary | ICD-10-CM

## 2016-02-04 NOTE — Patient Instructions (Signed)
Patient to return as needed. 

## 2016-02-04 NOTE — Progress Notes (Signed)
Patient ID: Colton Carter., male   DOB: 1946/02/02, 70 y.o.   MRN: [id number]  Chief Complaint  Patient presents with  . Follow-up    HPI Colton Carter. is a 70 y.o. male here today to discuss inguinal pain and follow up CT scan. On last visit, patient presented with inguinal pain but no overt hernia was palpated on exam. CT was done on 01/07/16 to identify the source of his pain. No new complaints or changes in his groin pain. I have reviewed the history of present illness with the patient.  HPI  Past Medical History:  Diagnosis Date  . Allergy   . Arthritis   . BPH (benign prostatic hyperplasia)   . Chronic neck pain   . Chronic prostatitis   . CKD (chronic kidney disease)   . Collagen vascular disease ([facility])    RA  . COPD (chronic obstructive pulmonary disease) ([place])   . Coronary artery disease   . Diabetes mellitus   . Diverticulosis   . ED (erectile dysfunction)   . Emphysema of lung ([place])   . Fatty liver   . GERD (gastroesophageal reflux disease)   . Headache   . High cholesterol   . Hypertension   . Hypogonadism in male   . Obesity   . Pancreatitis   . Peripheral vascular disease ([place])   . Personal history of tobacco use, presenting hazards to health 11/16/2015  . Phimosis   . Polycythemia, secondary 01/10/2015  . Renal disorder   . Skin cancer 2014   nose  . Stomach ulcer    in past  . Urinary frequency   . Urinary urgency     Past Surgical History:  Procedure Laterality Date  . APPENDECTOMY  1980  . CERVICAL FUSION    . CHOLECYSTECTOMY  2006  . COLONOSCOPY  2011   Dr [name]  . COLONOSCOPY WITH PROPOFOL N/A 07/11/2015   Procedure: COLONOSCOPY WITH PROPOFOL;  Surgeon: Christene Lye, MD;  Location: [facility];  Service: Endoscopy;  Laterality: N/A;  . ESOPHAGOGASTRODUODENOSCOPY (EGD) WITH PROPOFOL N/A 07/11/2015   Procedure: ESOPHAGOGASTRODUODENOSCOPY (EGD) WITH PROPOFOL;  Surgeon: Christene Lye, MD;  Location: [facility];   Service: Endoscopy;  Laterality: N/A;  . PERIPHERAL VASCULAR CATHETERIZATION N/A 02/06/2015   Procedure: Abdominal Aortogram w/Lower Extremity;  Surgeon: Katha Cabal, MD;  Location: [facility];  Service: Cardiovascular;  Laterality: N/A;  . PERIPHERAL VASCULAR CATHETERIZATION  02/06/2015   Procedure: Lower Extremity Intervention;  Surgeon: Katha Cabal, MD;  Location: [facility];  Service: Cardiovascular;;  . PERIPHERAL VASCULAR CATHETERIZATION Left 03/20/2015   Procedure: Lower Extremity Angiography;  Surgeon: Katha Cabal, MD;  Location: [facility];  Service: Cardiovascular;  Laterality: Left;  . PERIPHERAL VASCULAR CATHETERIZATION  03/20/2015   Procedure: Lower Extremity Intervention;  Surgeon: Katha Cabal, MD;  Location: [facility];  Service: Cardiovascular;;  . SKIN CANCER EXCISION  2014  . UPPER GI ENDOSCOPY      Family History  Problem Relation Age of Onset  . Pancreatic cancer Father   . Cancer Father   . Stroke Father   . Heart disease Mother   . Diabetes Mother   . Depression Mother     Social History Social History  Substance Use Topics  . Smoking status: Current Every Day Smoker    Packs/day: 0.25    Years: 50.00    Types: Cigarettes  . Smokeless tobacco: Never Used  Comment: 5 cigarettes a day  . Alcohol use No    Allergies  Allergen Reactions  . Levaquin [Levofloxacin In D5w] Other (See Comments)    Kidney shut down  . Milk-Related Compounds Nausea And Vomiting  . Septra Ds [Sulfamethoxazole-Trimethoprim]     Current Outpatient Prescriptions  Medication Sig Dispense Refill  . ADVAIR DISKUS 250-50 MCG/DOSE AEPB INHALE ONE PUFF EVERY 12 HOURS 60 each 11  . albuterol (PROVENTIL) (2.5 MG/3ML) 0.083% nebulizer solution Take 3 mLs (2.5 mg total) by nebulization every 6 (six) hours as needed for wheezing or shortness of breath. 150 mL 1  . Alpha-Lipoic Acid 600 MG CAPS Take 1 capsule (600 mg total) by  mouth daily. 30 each 11  . atenolol (TENORMIN) 25 MG tablet Take 1 tablet (25 mg total) by mouth daily. 90 tablet 3  . baclofen (LIORESAL) 10 MG tablet TAKE ONE TABLET BY MOUTH EVERY EIGHT HOURS 90 each 2  . busPIRone (BUSPAR) 7.5 MG tablet Take 1 tablet (7.5 mg total) by mouth 2 (two) times daily. 60 tablet 11  . capsaicin (ZOSTRIX) 0.025 % cream Apply topically 2 (two) times daily. To feet. 60 g 11  . Cholecalciferol (VITAMIN D-3) 5000 UNITS TABS Take by mouth 2 (two) times daily.    . diclofenac sodium (VOLTAREN) 1 % GEL Apply 2 g topically 4 (four) times daily. 100 g 5  . ENBREL SURECLICK 50 MG/ML injection     . furosemide (LASIX) 40 MG tablet TAKE ONE (1) TABLET BY MOUTH EVERY DAY 30 tablet 11  . gabapentin (NEURONTIN) 600 MG tablet TAKE ONE (1) TABLET FOUR (4) TIMES PER DAY 120 tablet 11  . HYDROcodone-acetaminophen (NORCO) 10-325 MG tablet Take 1 tablet by mouth every 6 (six) hours as needed.    . insulin aspart (NOVOLOG) 100 UNIT/ML injection Inject into skin per Sliding scale three times a day at mealtimes 10 mL 11  . Insulin Glargine (LANTUS) 100 UNIT/ML Solostar Pen Inject 65 Units into the skin 2 (two) times daily. 45 mL 3  . Insulin Pen Needle (B-D ULTRAFINE III SHORT PEN) 31G X 8 MM MISC USE ONE EACH 5 TIMES PER DAY WITH INSULIN. 100 each 11  . INVOKANA 300 MG TABS tablet TAKE ONE (1) TABLET EACH DAY 90 tablet 3  . ketoconazole (NIZORAL) 2 % cream Apply 1 application topically daily. 60 g 2  . levocetirizine ([name]) 5 MG tablet Take 1 tablet (5 mg total) by mouth every evening. 30 tablet 6  . montelukast (SINGULAIR) 10 MG tablet Take 1 tablet (10 mg total) by mouth at bedtime. 90 tablet 3  . niacin 500 MG tablet Take 500 mg by mouth at bedtime.    Marland Kitchen NOVOLOG FLEXPEN 100 UNIT/ML FlexPen USE AS DIRECTED PER SLIDING SCALE 100-150-5 UNITS, 151-200-7 UNITS, 201- 250-9 UNITS, 251-300-11 UNITS, 301-350- 13 UNITS, 351-400-15 UNITS 15 mL 11  . Omega-3 Fatty Acids (FISH OIL) 1000 MG CAPS Take  by mouth 2 (two) times daily. Reported on 10/26/2015    . omeprazole (PRILOSEC) 20 MG capsule Take 1 capsule (20 mg total) by mouth 2 (two) times daily before a meal. 60 capsule 11  . polyethylene glycol powder (GLYCOLAX/MIRALAX) powder 255 grams one bottle for colonoscopy prep 255 g 0  . polyethylene glycol powder (GLYCOLAX/MIRALAX) powder MIX 17GM (= 1 CAPFUL) IN LIQUID AND TAKEONE TIME DAILY AS NEEDED 527 g 5  . predniSONE (DELTASONE) 5 MG tablet     . senna (SENOKOT) 8.6 MG tablet Take 1 tablet  by mouth daily.    . sertraline (ZOLOFT) 100 MG tablet Take 1 tablet (100 mg total) by mouth daily. 90 tablet 3  . simvastatin (ZOCOR) 40 MG tablet TAKE ONE (1) TABLET BY MOUTH EVERY DAY 90 tablet 3  . tamsulosin (FLOMAX) 0.4 MG CAPS capsule Take 1 capsule (0.4 mg total) by mouth daily. 90 capsule 3  . ULORIC 80 MG TABS Take 1 tablet by mouth every other day.    . VENTOLIN HFA 108 (90 Base) MCG/ACT inhaler INHALE TWO PUFFS EVERY 4-6 HOURS 18 g 3  . vitamin B-12 (CYANOCOBALAMIN) 500 MCG tablet Take 1,000 mcg by mouth daily.    . vitamin C (ASCORBIC ACID) 500 MG tablet Take 500 mg by mouth daily.     Current Facility-Administered Medications  Medication Dose Route Frequency Provider Last Rate Last Dose  . ipratropium-albuterol (DUONEB) 0.5-2.5 (3) MG/3ML nebulizer solution 3 mL  3 mL Nebulization Q6H Amy Lauren Krebs, NP        Review of Systems Review of Systems  Constitutional: Negative.   Respiratory: Negative.   Cardiovascular: Negative.   Musculoskeletal:       Pain in inguinal area persists with bending and squatting.     Blood pressure 122/68, pulse 76, resp. rate 14, height 5\' 9"  (1.753 m), weight 195 lb (88.5 kg).  Physical Exam Physical Exam  Constitutional: He appears well-developed.  centripetal obesity  Abdominal: Soft. He exhibits no distension and no mass. There is no tenderness. There is no rebound and no guarding. No hernia.  Skin: Skin is warm and dry.    Data  Reviewed Prior notes and CT scan  Assessment   Groin pain - no hernia detected on scan or by palpation on this exam. Pain could be referred from arthritic changes in his spine.    Plan   Discussed assessment with patient. Patient encouraged to return to office if overt hernia arises or if patient develops increasing pain    Patient to return as needed.   PCP:  Genevie Cheshire  This information has been scribed by Gaspar Cola CMA.   SANKAR,SEEPLAPUTHUR G 02/05/2016, 9:46 AM

## 2016-02-05 ENCOUNTER — Encounter: Payer: Self-pay | Admitting: General Surgery

## 2016-02-08 ENCOUNTER — Encounter: Payer: Self-pay | Admitting: Family Medicine

## 2016-02-08 ENCOUNTER — Ambulatory Visit (INDEPENDENT_AMBULATORY_CARE_PROVIDER_SITE_OTHER): Payer: Medicare Other | Admitting: Family Medicine

## 2016-02-08 VITALS — BP 136/66 | HR 63 | Temp 97.8°F | Resp 16 | Ht 67.0 in | Wt 196.0 lb

## 2016-02-08 DIAGNOSIS — F32A Depression, unspecified: Secondary | ICD-10-CM

## 2016-02-08 DIAGNOSIS — Z794 Long term (current) use of insulin: Secondary | ICD-10-CM | POA: Diagnosis not present

## 2016-02-08 DIAGNOSIS — F418 Other specified anxiety disorders: Secondary | ICD-10-CM

## 2016-02-08 DIAGNOSIS — F329 Major depressive disorder, single episode, unspecified: Secondary | ICD-10-CM

## 2016-02-08 DIAGNOSIS — I10 Essential (primary) hypertension: Secondary | ICD-10-CM | POA: Diagnosis not present

## 2016-02-08 DIAGNOSIS — E1142 Type 2 diabetes mellitus with diabetic polyneuropathy: Secondary | ICD-10-CM | POA: Diagnosis not present

## 2016-02-08 DIAGNOSIS — F419 Anxiety disorder, unspecified: Secondary | ICD-10-CM

## 2016-02-08 DIAGNOSIS — N183 Chronic kidney disease, stage 3 unspecified: Secondary | ICD-10-CM

## 2016-02-08 LAB — POCT GLYCOSYLATED HEMOGLOBIN (HGB A1C): Hemoglobin A1C: 7.3

## 2016-02-08 MED ORDER — SIMVASTATIN 40 MG PO TABS: 20.0000 mg | ORAL_TABLET | Freq: Every day | ORAL | 3 refills | Status: DC

## 2016-02-08 MED ORDER — CANAGLIFLOZIN 100 MG PO TABS: 100.0000 mg | ORAL_TABLET | Freq: Every day | ORAL | 3 refills | Status: DC

## 2016-02-08 NOTE — Progress Notes (Signed)
Subjective:    Patient ID: Colton Rainwater., male    DOB: [birth year], 70 y.o.   MRN: [id number]  HPI: Colton Twiggs. is a 70 y.o. male presenting on 02/08/2016 for Depression (not improving)   HPI  Pt presents for diabetes and depression follow-up. Diabetes: Sugars have been variable. Low of 56 and high of 200. Avg 140. Has lows when he is working in the yard. Has been skipping lantus with low AM sugars. Does not feel well below 100. Has been at the beach- not eating well. Trying to exercise daily to help with sugars. Mainly does yard-work. A1c today is 7.3% Had eye exam this year. Due for 6 mos follow-up in the fall. He will call and ask for report to be forwarded to this office. No retinopathy but a cataract beginning.  His pain management doctor took him off Xanax abruptly. He has been struggling with anxiety since then. Feels short tempered. Having some family issues. Was going to try and see psychiatry at his pain management place. Will see him August 25th. Not sleeping well. Sleeping a few hours- gets up. The zoloft is helping him sleep better. Doing well with the buspar.   Past Medical History:  Diagnosis Date  . Allergy   . Arthritis   . BPH (benign prostatic hyperplasia)   . Chronic neck pain   . Chronic prostatitis   . CKD (chronic kidney disease)   . Collagen vascular disease (HCC)    RA  . COPD (chronic obstructive pulmonary disease) ([place])   . Coronary artery disease   . Diabetes mellitus   . Diverticulosis   . ED (erectile dysfunction)   . Emphysema of lung ([place])   . Fatty liver   . GERD (gastroesophageal reflux disease)   . Headache   . High cholesterol   . Hypertension   . Hypogonadism in male   . Obesity   . Pancreatitis   . Peripheral vascular disease ([place])   . Personal history of tobacco use, presenting hazards to health 11/16/2015  . Phimosis   . Polycythemia, secondary 01/10/2015  . Renal disorder   . Skin cancer 2014   nose  . Stomach ulcer    in past    . Urinary frequency   . Urinary urgency     Current Outpatient Prescriptions on File Prior to Visit  Medication Sig  . ADVAIR DISKUS 250-50 MCG/DOSE AEPB INHALE ONE PUFF EVERY 12 HOURS  . albuterol (PROVENTIL) (2.5 MG/3ML) 0.083% nebulizer solution Take 3 mLs (2.5 mg total) by nebulization every 6 (six) hours as needed for wheezing or shortness of breath.  . Alpha-Lipoic Acid 600 MG CAPS Take 1 capsule (600 mg total) by mouth daily.  Marland Kitchen atenolol (TENORMIN) 25 MG tablet Take 1 tablet (25 mg total) by mouth daily.  . baclofen (LIORESAL) 10 MG tablet TAKE ONE TABLET BY MOUTH EVERY EIGHT HOURS  . busPIRone (BUSPAR) 7.5 MG tablet Take 1 tablet (7.5 mg total) by mouth 2 (two) times daily.  . capsaicin (ZOSTRIX) 0.025 % cream Apply topically 2 (two) times daily. To feet.  . Cholecalciferol (VITAMIN D-3) 5000 UNITS TABS Take by mouth 2 (two) times daily.  . diclofenac sodium (VOLTAREN) 1 % GEL Apply 2 g topically 4 (four) times daily.  Colton Carter SURECLICK 50 MG/ML injection   . furosemide (LASIX) 40 MG tablet TAKE ONE (1) TABLET BY MOUTH EVERY DAY  . gabapentin (NEURONTIN) 600 MG tablet TAKE ONE (1) TABLET FOUR (4)  TIMES PER DAY  . HYDROcodone-acetaminophen (NORCO) 10-325 MG tablet Take 1 tablet by mouth every 6 (six) hours as needed.  . insulin aspart (NOVOLOG) 100 UNIT/ML injection Inject into skin per Sliding scale three times a day at mealtimes  . Insulin Glargine (LANTUS) 100 UNIT/ML Solostar Pen Inject 65 Units into the skin 2 (two) times daily.  . Insulin Pen Needle (B-D ULTRAFINE III SHORT PEN) 31G X 8 MM MISC USE ONE EACH 5 TIMES PER DAY WITH INSULIN.  . ketoconazole (NIZORAL) 2 % cream Apply 1 application topically daily.  Marland Kitchen levocetirizine ([name]) 5 MG tablet Take 1 tablet (5 mg total) by mouth every evening.  . montelukast (SINGULAIR) 10 MG tablet Take 1 tablet (10 mg total) by mouth at bedtime.  . niacin 500 MG tablet Take 500 mg by mouth at bedtime.  Marland Kitchen NOVOLOG FLEXPEN 100 UNIT/ML  FlexPen USE AS DIRECTED PER SLIDING SCALE 100-150-5 UNITS, 151-200-7 UNITS, 201- 250-9 UNITS, 251-300-11 UNITS, 301-350- 13 UNITS, 351-400-15 UNITS  . Omega-3 Fatty Acids (FISH OIL) 1000 MG CAPS Take by mouth 2 (two) times daily. Reported on 10/26/2015  . omeprazole (PRILOSEC) 20 MG capsule Take 1 capsule (20 mg total) by mouth 2 (two) times daily before a meal.  . polyethylene glycol powder (GLYCOLAX/MIRALAX) powder 255 grams one bottle for colonoscopy prep  . polyethylene glycol powder (GLYCOLAX/MIRALAX) powder MIX 17GM (= 1 CAPFUL) IN LIQUID AND TAKEONE TIME DAILY AS NEEDED  . predniSONE (DELTASONE) 5 MG tablet   . senna (SENOKOT) 8.6 MG tablet Take 1 tablet by mouth daily.  . sertraline (ZOLOFT) 100 MG tablet Take 1 tablet (100 mg total) by mouth daily.  . tamsulosin (FLOMAX) 0.4 MG CAPS capsule Take 1 capsule (0.4 mg total) by mouth daily.  Marland Kitchen ULORIC 80 MG TABS Take 1 tablet by mouth every other day.  . VENTOLIN HFA 108 (90 Base) MCG/ACT inhaler INHALE TWO PUFFS EVERY 4-6 HOURS  . vitamin B-12 (CYANOCOBALAMIN) 500 MCG tablet Take 1,000 mcg by mouth daily.  . vitamin C (ASCORBIC ACID) 500 MG tablet Take 500 mg by mouth daily.   Current Facility-Administered Medications on File Prior to Visit  Medication  . ipratropium-albuterol (DUONEB) 0.5-2.5 (3) MG/3ML nebulizer solution 3 mL    Review of Systems  Constitutional: Negative for chills and fever.  HENT: Negative.   Respiratory: Negative for chest tightness, shortness of breath and wheezing.   Cardiovascular: Negative for chest pain, palpitations and leg swelling.  Gastrointestinal: Negative for abdominal pain, nausea and vomiting.  Endocrine: Negative.   Genitourinary: Negative for discharge, dysuria, penile pain, testicular pain and urgency.  Musculoskeletal: Negative for arthralgias, back pain and joint swelling.  Skin: Negative.   Neurological: Negative for dizziness, weakness, numbness and headaches.  Psychiatric/Behavioral:  Negative for dysphoric mood and sleep disturbance.   Per HPI unless specifically indicated above     Objective:    BP 136/66 (BP Location: Left Arm, Patient Position: Sitting, Cuff Size: Normal)   Pulse 63   Temp 97.8 F (36.6 C) (Oral)   Resp 16   Ht 5\' 7"  (1.702 m)   Wt 196 lb (88.9 kg)   BMI 30.70 kg/m   Wt Readings from Last 3 Encounters:  02/08/16 196 lb (88.9 kg)  02/04/16 195 lb (88.5 kg)  01/02/16 197 lb (89.4 kg)    Depression screen [facility] 2/9 02/08/2016 10/11/2015 08/06/2015 08/06/2015  Decreased Interest 3 2 2  0  Down, Depressed, Hopeless 3 1 1  0  PHQ - 2 Score 6  3 3 0  Altered sleeping 2 1 2  -  Tired, decreased energy 3 1 1  -  Change in appetite 0 0 0 -  Feeling bad or failure about yourself  2 - 1 -  Trouble concentrating 2 1 0 -  Moving slowly or fidgety/restless 2 0 1 -  Suicidal thoughts 0 0 0 -  PHQ-9 Score 17 6 8  -  Difficult doing work/chores Extremely dIfficult - Somewhat difficult -   GAD 7 : Generalized Anxiety Score 02/08/2016 01/01/2016 12/19/2015 10/11/2015  Nervous, Anxious, on Edge 2 3 3 1   Control/stop worrying 2 2 3 1   Worry too much - different things 2 3 3 1   Trouble relaxing 3 3 3 1   Restless 2 3 3  0  Easily annoyed or irritable 3 3 3 1   Afraid - awful might happen 1 2 3  0  Total GAD 7 Score 15 19 21 5   Anxiety Difficulty - Very difficult Extremely difficult Not difficult at all     Physical Exam  Constitutional: He is oriented to person, place, and time. He appears well-developed and well-nourished. No distress.  HENT:  Head: Normocephalic and atraumatic.  Neck: Neck supple. No thyromegaly present.  Cardiovascular: Normal rate, regular rhythm and normal heart sounds.  Exam reveals no gallop and no friction rub.   No murmur heard. Pulmonary/Chest: Effort normal and breath sounds normal. He has no wheezes.  Abdominal: Soft. Bowel sounds are normal. He exhibits no distension. There is no tenderness. There is no rebound.  Musculoskeletal:  Normal range of motion. He exhibits no edema or tenderness.  Neurological: He is alert and oriented to person, place, and time. He has normal reflexes.  Skin: Skin is warm and dry. No rash noted. No erythema.  Psychiatric: He has a normal mood and affect. His behavior is normal. Thought content normal.    Diabetic Foot Exam - Simple   Simple Foot Form Diabetic Foot exam was performed with the following findings:  Yes 02/08/2016  9:32 AM  Visual Inspection No deformities, no ulcerations, no other skin breakdown bilaterally:  Yes Sensation Testing Intact to touch and monofilament testing bilaterally:  Yes Pulse Check Posterior Tibialis and Dorsalis pulse intact bilaterally:  Yes Comments     Results for orders placed or performed in visit on 02/08/16  POCT HgB A1C  Result Value Ref Range   Hemoglobin A1C 7.3   HM DIABETES EYE EXAM  Result Value Ref Range   HM Diabetic Eye Exam No Retinopathy No Retinopathy      Assessment & Plan:   Problem List Items Addressed This Visit      Cardiovascular and Mediastinum   Essential hypertension    Controlled today. Check CMP for kidney function.       Relevant Medications   simvastatin (ZOCOR) 40 MG tablet     Endocrine   Type 2 diabetes mellitus ([place]) - Primary    Reduce invokana to 100mg  daily 2/2 renal function. Increase sliding scale insulin. Discouraged pt from skipping lantus. Reviewed diet and exercise changes to help reduce A1c of goal <7.0.  Recheck 3 mos.  Foot exam: Done. Eye exam: Done, need report. UA micro: Negative.       Relevant Medications   canagliflozin (INVOKANA) 100 MG TABS tablet   simvastatin (ZOCOR) 40 MG tablet   Other Relevant Orders   POCT HgB A1C (Completed)   COMPLETE METABOLIC PANEL WITH GFR     Genitourinary   Chronic renal failure (Chronic)  Reduce invokana 2/2 renal function. Recheck CMP today. Pt will continue to follow with nephrology. Will see them in the fall.         Other    Anxiety and depression    Mild improvement with buspar. Discussed coping mechanisms for family stress.  Pt has appt with psychiatry on 8/25 to discuss anxiety and medication management. To [facility] for needs prior to 8/25. To ER if he feels a danger to himself or others.        Other Visit Diagnoses   None.     Meds ordered this encounter  Medications  . canagliflozin (INVOKANA) 100 MG TABS tablet    Sig: Take 1 tablet (100 mg total) by mouth daily before breakfast.    Dispense:  90 tablet    Refill:  3    Order Specific Question:   Supervising Provider    Answer:   Arlis Porta [phone]  . simvastatin (ZOCOR) 40 MG tablet    Sig: Take 0.5 tablets (20 mg total) by mouth daily at 6 PM.    Dispense:  90 tablet    Refill:  3    Order Specific Question:   Supervising Provider    Answer:   Arlis Porta [id number]      Follow up plan: Return in about 3 months (around 05/10/2016) for Diabetes. Marland Kitchen

## 2016-02-08 NOTE — Assessment & Plan Note (Signed)
Mild improvement with buspar. Discussed coping mechanisms for family stress.  Pt has appt with psychiatry on 8/25 to discuss anxiety and medication management. To [facility] for needs prior to 8/25. To ER if he feels a danger to himself or others.

## 2016-02-08 NOTE — Patient Instructions (Addendum)
On your sliding scale- start at 8units for any sugars 80-150. Take 12 units of sugars 150-200 and then following sliding scale. Keep lantus the same.

## 2016-02-08 NOTE — Assessment & Plan Note (Signed)
Controlled today. Check CMP for kidney function.

## 2016-02-08 NOTE — Assessment & Plan Note (Signed)
Reduce invokana 2/2 renal function. Recheck CMP today. Pt will continue to follow with nephrology. Will see them in the fall.

## 2016-02-08 NOTE — Assessment & Plan Note (Signed)
Reduce invokana to 100mg  daily 2/2 renal function. Increase sliding scale insulin. Discouraged pt from skipping lantus. Reviewed diet and exercise changes to help reduce A1c of goal <7.0.  Recheck 3 mos.  Foot exam: Done. Eye exam: Done, need report. UA micro: Negative.

## 2016-02-09 LAB — COMPLETE METABOLIC PANEL WITH GFR
ALK PHOS: 66 U/L (ref 40–115)
ALT: 14 U/L (ref 9–46)
AST: 16 U/L (ref 10–35)
Albumin: 4.2 g/dL (ref 3.6–5.1)
BILIRUBIN TOTAL: 0.6 mg/dL (ref 0.2–1.2)
BUN: 21 mg/dL (ref 7–25)
CALCIUM: 8.9 mg/dL (ref 8.6–10.3)
CO2: 28 mmol/L (ref 20–31)
CREATININE: 1.32 mg/dL — AB (ref 0.70–1.25)
Chloride: 107 mmol/L (ref 98–110)
GFR, EST AFRICAN AMERICAN: 63 mL/min (ref >=60)
GFR, Est Non African American: 55 mL/min — ABNORMAL LOW (ref >=60)
Glucose, Bld: 133 mg/dL — ABNORMAL HIGH (ref 65–99)
Potassium: 4.2 mmol/L (ref 3.5–5.3)
Sodium: 142 mmol/L (ref 135–146)
TOTAL PROTEIN: 6.6 g/dL (ref 6.1–8.1)

## 2016-02-12 ENCOUNTER — Encounter (HOSPITAL_BASED_OUTPATIENT_CLINIC_OR_DEPARTMENT_OTHER): Payer: Self-pay

## 2016-02-14 ENCOUNTER — Encounter: Payer: Self-pay | Admitting: Urology

## 2016-02-14 ENCOUNTER — Ambulatory Visit (INDEPENDENT_AMBULATORY_CARE_PROVIDER_SITE_OTHER): Payer: Medicare Other | Admitting: Urology

## 2016-02-14 VITALS — BP 139/79 | HR 63 | Ht 67.0 in | Wt 198.5 lb

## 2016-02-14 DIAGNOSIS — N528 Other male erectile dysfunction: Secondary | ICD-10-CM

## 2016-02-14 DIAGNOSIS — N401 Enlarged prostate with lower urinary tract symptoms: Secondary | ICD-10-CM

## 2016-02-14 DIAGNOSIS — L989 Disorder of the skin and subcutaneous tissue, unspecified: Secondary | ICD-10-CM | POA: Diagnosis not present

## 2016-02-14 DIAGNOSIS — E291 Testicular hypofunction: Secondary | ICD-10-CM | POA: Diagnosis not present

## 2016-02-14 DIAGNOSIS — N138 Other obstructive and reflux uropathy: Secondary | ICD-10-CM

## 2016-02-14 DIAGNOSIS — N529 Male erectile dysfunction, unspecified: Secondary | ICD-10-CM

## 2016-02-14 LAB — BLADDER SCAN AMB NON-IMAGING: Scan Result: 67

## 2016-02-14 NOTE — Progress Notes (Signed)
02/14/2016 10:26 AM   [name] April 03, 1946 [id number]  Referring provider: [name], NP [place], [place] [address]  Chief Complaint  Patient presents with  . Benign Prostatic Hypertrophy    1 month follow up  . Hypogonadism    HPI: Patient is a 70 year old Caucasian male who presents today after a one month trial of tamsulosin 0. 4 mg daily.    Hypogonadism Patient is experiencing a decrease in libido, a lack of energy, a decrease in strength, a loss in height, a decreased enjoyment in life, sadness and/or grumpiness, erections being less strong, a recent deterioration in an ability to play sports, falling asleep after dinner and a recent deterioration in their work performance.  This is indicated by his responses to the ADAM questionnaire.  A current morning serum testosterone level was found to be 318 ng/dL on 11/19/2015.  He was on testosterone cypionate in the past with Dr. Bernardo Heater, but it was discontinued by his nephrologist due to peripheral edema.  His Sylvester, LH and prolactin were found to be normal in 2010.          Androgen Deficiency in the Aging Male    Pine Name 12/17/15 1000         Androgen Deficiency in the Aging Male   Do you have a decrease in libido (sex drive) Yes     Do you have lack of energy Yes     Do you have a decrease in strength and/or endurance Yes     Have you lost height Yes     Have you noticed a decreased "enjoyment of life" Yes     Are you sad and/or grumpy Yes     Are your erections less strong Yes     Have you noticed a recent deterioration in your ability to play sports Yes     Are you falling asleep after dinner Yes     Has there been a recent deterioration in your work performance Yes       Erectile dysfunction His SHIM score is 8, which is moderate erectile dysfunction.   He has been having difficulty with erections for over three years.   His major complaint is a decrease in the size of his penis and lack of  firmness with his erections.  His libido is diminished.   His risk factors for ED are hypogonadism, BPH, smoking, CAD, COPD, DM, HLD, CKD, chronic pain and polycythemia.  He denies any painful erections or curvatures with his erections.   He has tried PDE5-inhibitors in the past with limited success.       SHIM    Row Name 12/17/15 1013         SHIM: Over the last 6 months:   How do you rate your confidence that you could get and keep an erection? Very Low     When you had erections with sexual stimulation, how often were your erections hard enough for penetration (entering your partner)? Almost Never or Never     During sexual intercourse, how often were you able to maintain your erection after you had penetrated (entered) your partner? Very Difficult     During sexual intercourse, how difficult was it to maintain your erection to completion of intercourse? Very Difficult     When you attempted sexual intercourse, how often was it satisfactory for you? Very Difficult       SHIM Total Score   SHIM 8  Score: 1-7 Severe ED 8-11 Moderate ED 12-16 Mild-Moderate ED 17-21 Mild ED 22-25 No ED  BPH WITH LUTS His IPSS score today is 25, which is severe lower urinary tract symptomatology. He is unhappy with his quality life due to his urinary symptoms. His PVR is 67 mL.  His previous PVR is 141 mL.   His previous IPSS was 29/5.  His major complaint today frequency, urgency, nocturia x 3, incontinence, intermittency, hesitancy and weak urinary stream.  He has had these symptoms for 8 or 9 months.  He denies any dysuria, hematuria or suprapubic pain.   He has had a biopsy of his prostate in 2007 due to a abnormal DRE.  The results were benign.  He also denies any recent fevers, chills, nausea or vomiting.  He does not have a family history of PCa.  He is drinking diet drinks, coffee and water.        IPSS    Row Name 12/17/15 1000 02/14/16 1000       International Prostate Symptom Score    How often have you had the sensation of not emptying your bladder? More than half the time About half the time    How often have you had to urinate less than every two hours? Almost always More than half the time    How often have you found you stopped and started again several times when you urinated? More than half the time More than half the time    How often have you found it difficult to postpone urination? Almost always More than half the time    How often have you had a weak urinary stream? Almost always More than half the time    How often have you had to strain to start urination? About half the time Less than half the time    How many times did you typically get up at night to urinate? 3 Times 3 Times    Total IPSS Score 29 24      Quality of Life due to urinary symptoms   If you were to spend the rest of your life with your urinary condition just the way it is now how would you feel about that? Unhappy Unhappy  5       Score:  1-7 Mild 8-19 Moderate 20-35 Severe        PMH: Past Medical History:  Diagnosis Date  . Allergy   . Arthritis   . BPH (benign prostatic hyperplasia)   . Chronic neck pain   . Chronic prostatitis   . CKD (chronic kidney disease)   . Collagen vascular disease (HCC)    RA  . COPD (chronic obstructive pulmonary disease) ([place])   . Coronary artery disease   . Diabetes mellitus   . Diverticulosis   . ED (erectile dysfunction)   . Emphysema of lung ([place])   . Fatty liver   . GERD (gastroesophageal reflux disease)   . Headache   . High cholesterol   . Hypertension   . Hypogonadism in male   . Obesity   . Pancreatitis   . Peripheral vascular disease ([place])   . Personal history of tobacco use, presenting hazards to health 11/16/2015  . Phimosis   . Polycythemia, secondary 01/10/2015  . Renal disorder   . Skin cancer 2014   nose  . Stomach ulcer    in past  . Urinary frequency   . Urinary urgency     Surgical History: Past Surgical  History:  Procedure Laterality Date  . APPENDECTOMY  1980  . CERVICAL FUSION    . CHOLECYSTECTOMY  2006  . COLONOSCOPY  2011   Dr [name]  . COLONOSCOPY WITH PROPOFOL N/A 07/11/2015   Procedure: COLONOSCOPY WITH PROPOFOL;  Surgeon: Christene Lye, MD;  Location: [facility];  Service: Endoscopy;  Laterality: N/A;  . ESOPHAGOGASTRODUODENOSCOPY (EGD) WITH PROPOFOL N/A 07/11/2015   Procedure: ESOPHAGOGASTRODUODENOSCOPY (EGD) WITH PROPOFOL;  Surgeon: Christene Lye, MD;  Location: [facility] END[facility];  Service: Endoscopy;  Laterality: N/A;  . PERIPHERAL VASCULAR CATHETERIZATION N/A 02/06/2015   Procedure: Abdominal Aortogram w/Lower Extremity;  Surgeon: Katha Cabal, MD;  Location: [place] [facility];  Service: Cardiovascular;  Laterality: N/A;  . PERIPHERAL VASCULAR CATHETERIZATION  02/06/2015   Procedure: Lower Extremity Intervention;  Surgeon: Katha Cabal, MD;  Location: [facility];  Service: Cardiovascular;;  . PERIPHERAL VASCULAR CATHETERIZATION Left 03/20/2015   Procedure: Lower Extremity Angiography;  Surgeon: Katha Cabal, MD;  Location: [facility];  Service: Cardiovascular;  Laterality: Left;  . PERIPHERAL VASCULAR CATHETERIZATION  03/20/2015   Procedure: Lower Extremity Intervention;  Surgeon: Katha Cabal, MD;  Location: [place] [facility];  Service: Cardiovascular;;  . SKIN CANCER EXCISION  2014  . UPPER GI ENDOSCOPY      Home Medications:    Medication List       Accurate as of 02/14/16 10:26 AM. Always use your most recent med list.          ADVAIR DISKUS 250-50 MCG/DOSE Aepb Generic drug:  Fluticasone-Salmeterol INHALE ONE PUFF EVERY 12 HOURS   Alpha-Lipoic Acid 600 MG Caps Take 1 capsule (600 mg total) by mouth daily.   atenolol 25 MG tablet Commonly known as:  TENORMIN Take 1 tablet (25 mg total) by mouth daily.   baclofen 10 MG tablet Commonly known as:  LIORESAL TAKE ONE TABLET BY MOUTH EVERY EIGHT HOURS     busPIRone 7.5 MG tablet Commonly known as:  BUSPAR Take 1 tablet (7.5 mg total) by mouth 2 (two) times daily.   canagliflozin 100 MG Tabs tablet Commonly known as:  INVOKANA Take 1 tablet (100 mg total) by mouth daily before breakfast.   capsaicin 0.025 % cream Commonly known as:  ZOSTRIX Apply topically 2 (two) times daily. To feet.   diclofenac sodium 1 % Gel Commonly known as:  VOLTAREN Apply 2 g topically 4 (four) times daily.   ENBREL SURECLICK 50 MG/ML injection Generic drug:  etanercept   Fish Oil 1000 MG Caps Take by mouth 2 (two) times daily. Reported on 10/26/2015   furosemide 40 MG tablet Commonly known as:  LASIX TAKE ONE (1) TABLET BY MOUTH EVERY DAY   gabapentin 600 MG tablet Commonly known as:  NEURONTIN TAKE ONE (1) TABLET FOUR (4) TIMES PER DAY   HYDROcodone-acetaminophen 10-325 MG tablet Commonly known as:  NORCO Take 1 tablet by mouth every 6 (six) hours as needed.   Insulin Glargine 100 UNIT/ML Solostar Pen Commonly known as:  LANTUS Inject 65 Units into the skin 2 (two) times daily.   Insulin Pen Needle 31G X 8 MM Misc Commonly known as:  B-D ULTRAFINE III SHORT PEN USE ONE EACH 5 TIMES PER DAY WITH INSULIN.   ketoconazole 2 % cream Commonly known as:  NIZORAL Apply 1 application topically daily.   levocetirizine 5 MG tablet Commonly known as:  [name] Take 1 tablet (5 mg total) by mouth every evening.   montelukast 10 MG  tablet Commonly known as:  SINGULAIR Take 1 tablet (10 mg total) by mouth at bedtime.   niacin 500 MG tablet Take 500 mg by mouth at bedtime.   NOVOLOG FLEXPEN 100 UNIT/ML FlexPen Generic drug:  insulin aspart USE AS DIRECTED PER SLIDING SCALE 100-150-5 UNITS, 151-200-7 UNITS, 201- 250-9 UNITS, 251-300-11 UNITS, 301-350- 13 UNITS, 351-400-15 UNITS   insulin aspart 100 UNIT/ML injection Commonly known as:  novoLOG Inject into skin per Sliding scale three times a day at mealtimes   omeprazole 20 MG capsule Commonly  known as:  PRILOSEC Take 1 capsule (20 mg total) by mouth 2 (two) times daily before a meal.   polyethylene glycol powder powder Commonly known as:  GLYCOLAX/MIRALAX 255 grams one bottle for colonoscopy prep   polyethylene glycol powder powder Commonly known as:  GLYCOLAX/MIRALAX MIX 17GM (= 1 CAPFUL) IN LIQUID AND TAKEONE TIME DAILY AS NEEDED   predniSONE 5 MG tablet Commonly known as:  DELTASONE   senna 8.6 MG tablet Commonly known as:  SENOKOT Take 1 tablet by mouth daily.   sertraline 100 MG tablet Commonly known as:  ZOLOFT Take 1 tablet (100 mg total) by mouth daily.   simvastatin 40 MG tablet Commonly known as:  ZOCOR Take 0.5 tablets (20 mg total) by mouth daily at 6 PM.   tamsulosin 0.4 MG Caps capsule Commonly known as:  FLOMAX Take 1 capsule (0.4 mg total) by mouth daily.   ULORIC 80 MG Tabs Generic drug:  Febuxostat Take 1 tablet by mouth every other day.   VENTOLIN HFA 108 (90 Base) MCG/ACT inhaler Generic drug:  albuterol INHALE TWO PUFFS EVERY 4-6 HOURS   albuterol (2.5 MG/3ML) 0.083% nebulizer solution Commonly known as:  PROVENTIL Take 3 mLs (2.5 mg total) by nebulization every 6 (six) hours as needed for wheezing or shortness of breath.   vitamin B-12 500 MCG tablet Commonly known as:  CYANOCOBALAMIN Take 1,000 mcg by mouth daily.   vitamin C 500 MG tablet Commonly known as:  ASCORBIC ACID Take 500 mg by mouth daily.   Vitamin D-3 5000 UNITS Tabs Take by mouth 2 (two) times daily.       Allergies:  Allergies  Allergen Reactions  . Levaquin [Levofloxacin In D5w] Other (See Comments)    Kidney shut down  . Milk-Related Compounds Nausea And Vomiting  . Septra Ds [Sulfamethoxazole-Trimethoprim]     Family History: Family History  Problem Relation Age of Onset  . Pancreatic cancer Father   . Cancer Father   . Stroke Father   . Heart disease Mother   . Diabetes Mother   . Depression Mother   . Kidney disease Neg Hx     Social  History:  reports that he has been smoking Cigarettes.  He has a 12.50 pack-year smoking history. He has never used smokeless tobacco. He reports that he does not drink alcohol or use drugs.  ROS: UROLOGY Frequent Urination?: Yes Hard to postpone urination?: Yes Burning/pain with urination?: No Get up at night to urinate?: Yes Leakage of urine?: No Urine stream starts and stops?: Yes Trouble starting stream?: No Do you have to strain to urinate?: No Blood in urine?: No Urinary tract infection?: No Sexually transmitted disease?: No Injury to kidneys or bladder?: No Painful intercourse?: No Weak stream?: Yes Erection problems?: Yes Penile pain?: No  Gastrointestinal Nausea?: No Vomiting?: No Indigestion/heartburn?: Yes Diarrhea?: No Constipation?: No  Constitutional Fever: No Night sweats?: Yes Weight loss?: No Fatigue?: Yes  Skin Skin rash/lesions?:  No Itching?: Yes  Eyes Blurred vision?: Yes Double vision?: No  Ears/Nose/Throat Sore throat?: Yes Sinus problems?: Yes  Hematologic/Lymphatic Swollen glands?: No Easy bruising?: Yes  Cardiovascular Leg swelling?: Yes Chest pain?: No  Respiratory Cough?: No Shortness of breath?: Yes  Endocrine Excessive thirst?: Yes  Musculoskeletal Back pain?: Yes Joint pain?: Yes  Neurological Headaches?: Yes Dizziness?: Yes  Psychologic Depression?: Yes Anxiety?: Yes  Physical Exam: BP 139/79   Pulse 63   Ht 5\' 7"  (1.702 m)   Wt 198 lb 8 oz (90 kg)   BMI 31.09 kg/m   Constitutional: Well nourished. Alert and oriented, No acute distress. HEENT: [place] AT, moist mucus membranes. Trachea midline, no masses. Cardiovascular: No clubbing, cyanosis, or edema. Respiratory: Normal respiratory effort, no increased work of breathing. Skin: No rashes, bruises or suspicious lesions. Lymph: No cervical or inguinal adenopathy. Neurologic: Grossly intact, no focal deficits, moving all 4 extremities. Psychiatric:  Normal mood and affect.  Laboratory Data: Lab Results  Component Value Date   WBC 8.9 06/27/2015   HGB 16.4 12/26/2015   HCT 48.5 12/26/2015   MCV 91.4 06/27/2015   PLT 121 (L) 06/27/2015    Lab Results  Component Value Date   CREATININE 1.32 (H) 02/08/2016     Lab Results  Component Value Date   TESTOSTERONE 318 (L) 11/19/2015    Lab Results  Component Value Date   HGBA1C 7.3 02/08/2016    Lab Results  Component Value Date   TSH 1.70 07/15/2011       Component Value Date/Time   CHOL 124 11/19/2015 0810   CHOL 92 07/15/2011 0528   HDL 38 (L) 11/19/2015 0810   HDL 20 (L) 07/15/2011 0528   CHOLHDL 3.3 11/19/2015 0810   VLDL 43 (H) 07/15/2011 0528   LDLCALC 51 11/19/2015 0810   LDLCALC 29 07/15/2011 0528    Lab Results  Component Value Date   AST 16 02/08/2016   Lab Results  Component Value Date   ALT 14 02/08/2016   Pertinent imaging Results for Korpi, EXZAVIER BACKES (MRN [id number]) as of 02/14/2016 10:13  Ref. Range 02/14/2016 10:09  Scan Result Unknown 67     Assessment & Plan:    1. Hypogonadism:   Has an upcoming appointment with cardiology.  Will need clearance if he wants to pursue testosterone treatments.    2. Erectile dysfunction:   SHIM score is 8.   Wants to check with nephrologist prior to taking meds.  Has an upcoming appointment with nephrologist.  3. BPH with LUTS  - IPSS score is 24/5, it is improving  - Continue conservative management, avoiding bladder irritants and timed voiding's (stop diet drinks and reduce coffee)  - Sleep study in the future  - Continue tamsulosin 0.4 mg daily  - RTC in 3 months for IPSS and PVR  4. Suspicious skin lesion:   Encouraged patient to make the appointment with his dermatologist.    Return in about 3 months (around 05/16/2016) for IPSS and PVR.  These notes generated with voice recognition software. I apologize for typographical errors.  Zara Council, [facility] [address] [place], [place] [address] [phone]

## 2016-02-15 ENCOUNTER — Encounter: Payer: Self-pay | Admitting: Sports Medicine

## 2016-02-15 ENCOUNTER — Ambulatory Visit (INDEPENDENT_AMBULATORY_CARE_PROVIDER_SITE_OTHER): Payer: Medicare Other | Admitting: Sports Medicine

## 2016-02-15 DIAGNOSIS — B351 Tinea unguium: Secondary | ICD-10-CM

## 2016-02-15 DIAGNOSIS — E1142 Type 2 diabetes mellitus with diabetic polyneuropathy: Secondary | ICD-10-CM | POA: Diagnosis not present

## 2016-02-15 DIAGNOSIS — M79676 Pain in unspecified toe(s): Secondary | ICD-10-CM | POA: Diagnosis not present

## 2016-02-15 NOTE — Progress Notes (Signed)
Patient ID: Colton Carter., male   DOB: [birth year], 70 y.o.   MRN: [id number]  Subjective: Colton Carter. is a 70 y.o. male patient with history of type 2 diabetes who returns to office today complaining of long, painful nails  while ambulating in shoes; unable to trim. Patient states that the glucose reading this morning was good, a1c 7.3. Patient denies any new changes in medication or new problems. Patient denies any new cramping, numbness, burning or tingling in the legs.  Patient Active Problem List   Diagnosis Date Noted  . Essential hypertension 01/01/2016  . BPH without obstruction/lower urinary tract symptoms 12/17/2015  . Erectile dysfunction of organic origin 12/17/2015  . Personal history of tobacco use, presenting hazards to health 11/16/2015  . Chronic obstructive pulmonary disease ([place]) 11/13/2015  . Controlled type 2 diabetes mellitus without complication ([place]) 123456  . HLD (hyperlipidemia) 11/13/2015  . 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 BPH (benign prostatic hyperplasia) 11/13/2015  . Current tobacco use 10/11/2015  . Type 2 diabetes mellitus ([place]) 08/06/2015  . Anxiety and depression 08/06/2015  . Polycythemia, secondary 01/10/2015  . Low testosterone 04/15/2012  . Rheumatoid arthritis ([place]) 04/15/2012  . GERD (gastroesophageal reflux disease) 04/15/2012  . COPD (chronic obstructive pulmonary disease) ([place]) 04/15/2012  . Chronic renal failure 04/14/2012  . Multiple fractures of ribs of right side 04/13/2012  . Fall from ladder 04/13/2012  . Abrasion of left knee 04/13/2012  . DM type 2 with diabetic peripheral neuropathy ([place]) 04/13/2012  . Acute blood loss anemia 04/13/2012  . High cholesterol   . Chronic neck pain    Current Outpatient Prescriptions on File Prior to Visit  Medication Sig Dispense Refill  . ADVAIR DISKUS 250-50 MCG/DOSE AEPB INHALE ONE PUFF EVERY 12 HOURS 60 each 11  . albuterol (PROVENTIL) (2.5 MG/3ML) 0.083% nebulizer  solution Take 3 mLs (2.5 mg total) by nebulization every 6 (six) hours as needed for wheezing or shortness of breath. 150 mL 1  . Alpha-Lipoic Acid 600 MG CAPS Take 1 capsule (600 mg total) by mouth daily. 30 each 11  . atenolol (TENORMIN) 25 MG tablet Take 1 tablet (25 mg total) by mouth daily. 90 tablet 3  . baclofen (LIORESAL) 10 MG tablet TAKE ONE TABLET BY MOUTH EVERY EIGHT HOURS 90 each 2  . busPIRone (BUSPAR) 7.5 MG tablet Take 1 tablet (7.5 mg total) by mouth 2 (two) times daily. 60 tablet 11  . canagliflozin (INVOKANA) 100 MG TABS tablet Take 1 tablet (100 mg total) by mouth daily before breakfast. 90 tablet 3  . capsaicin (ZOSTRIX) 0.025 % cream Apply topically 2 (two) times daily. To feet. 60 g 11  . Cholecalciferol (VITAMIN D-3) 5000 UNITS TABS Take by mouth 2 (two) times daily.    . diclofenac sodium (VOLTAREN) 1 % GEL Apply 2 g topically 4 (four) times daily. 100 g 5  . ENBREL SURECLICK 50 MG/ML injection     . furosemide (LASIX) 40 MG tablet TAKE ONE (1) TABLET BY MOUTH EVERY DAY 30 tablet 11  . gabapentin (NEURONTIN) 600 MG tablet TAKE ONE (1) TABLET FOUR (4) TIMES PER DAY 120 tablet 11  . HYDROcodone-acetaminophen (NORCO) 10-325 MG tablet Take 1 tablet by mouth every 6 (six) hours as needed.    . insulin aspart (NOVOLOG) 100 UNIT/ML injection Inject into skin per Sliding scale three times a day at mealtimes 10 mL 11  . Insulin Glargine (LANTUS) 100 UNIT/ML Solostar Pen Inject 65 Units into  the skin 2 (two) times daily. 45 mL 3  . Insulin Pen Needle (B-D ULTRAFINE III SHORT PEN) 31G X 8 MM MISC USE ONE EACH 5 TIMES PER DAY WITH INSULIN. 100 each 11  . ketoconazole (NIZORAL) 2 % cream Apply 1 application topically daily. 60 g 2  . levocetirizine ([name]) 5 MG tablet Take 1 tablet (5 mg total) by mouth every evening. 30 tablet 6  . montelukast (SINGULAIR) 10 MG tablet Take 1 tablet (10 mg total) by mouth at bedtime. 90 tablet 3  . niacin 500 MG tablet Take 500 mg by mouth at bedtime.     Marland Kitchen NOVOLOG FLEXPEN 100 UNIT/ML FlexPen USE AS DIRECTED PER SLIDING SCALE 100-150-5 UNITS, 151-200-7 UNITS, 201- 250-9 UNITS, 251-300-11 UNITS, 301-350- 13 UNITS, 351-400-15 UNITS 15 mL 11  . Omega-3 Fatty Acids (FISH OIL) 1000 MG CAPS Take by mouth 2 (two) times daily. Reported on 10/26/2015    . omeprazole (PRILOSEC) 20 MG capsule Take 1 capsule (20 mg total) by mouth 2 (two) times daily before a meal. 60 capsule 11  . polyethylene glycol powder (GLYCOLAX/MIRALAX) powder 255 grams one bottle for colonoscopy prep 255 g 0  . polyethylene glycol powder (GLYCOLAX/MIRALAX) powder MIX 17GM (= 1 CAPFUL) IN LIQUID AND TAKEONE TIME DAILY AS NEEDED (Patient not taking: Reported on 02/14/2016) 527 g 5  . predniSONE (DELTASONE) 5 MG tablet     . senna (SENOKOT) 8.6 MG tablet Take 1 tablet by mouth daily.    . sertraline (ZOLOFT) 100 MG tablet Take 1 tablet (100 mg total) by mouth daily. 90 tablet 3  . simvastatin (ZOCOR) 40 MG tablet Take 0.5 tablets (20 mg total) by mouth daily at 6 PM. 90 tablet 3  . tamsulosin (FLOMAX) 0.4 MG CAPS capsule Take 1 capsule (0.4 mg total) by mouth daily. 90 capsule 3  . ULORIC 80 MG TABS Take 1 tablet by mouth every other day.    . VENTOLIN HFA 108 (90 Base) MCG/ACT inhaler INHALE TWO PUFFS EVERY 4-6 HOURS 18 g 3  . vitamin B-12 (CYANOCOBALAMIN) 500 MCG tablet Take 1,000 mcg by mouth daily.    . vitamin C (ASCORBIC ACID) 500 MG tablet Take 500 mg by mouth daily.     Current Facility-Administered Medications on File Prior to Visit  Medication Dose Route Frequency Provider Last Rate Last Dose  . ipratropium-albuterol (DUONEB) 0.5-2.5 (3) MG/3ML nebulizer solution 3 mL  3 mL Nebulization Q6H Amy Lauren Krebs, NP       Allergies  Allergen Reactions  . Levaquin [Levofloxacin In D5w] Other (See Comments)    Kidney shut down  . Milk-Related Compounds Nausea And Vomiting  . Septra Ds [Sulfamethoxazole-Trimethoprim]      Objective: General: Patient is awake, alert, and  oriented x 3 and in no acute distress.  Integument: Skin is warm, dry and supple bilateral. Nails are tender, long, thickened and  dystrophic with subungual debris, consistent with onychomycosis, 1-5 bilateral; nail spicules are noted at both hallux nails from previous nail avulsions. No scaly skin plantar surfaces- tinea resolved. No open lesions or preulcerative lesions present bilateral. Remaining integument unremarkable.  Vasculature:  Dorsalis Pedis pulse 2/4 bilateral. Posterior Tibial pulse  2/4 bilateral.  Capillary fill time <3 sec 1-5 bilateral. No hair growth to the level of the digits. Temperature gradient within normal limits. + hyperpigmentation and varicosities present bilateral. No edema present bilateral.   Neurology: The patient has intact sensation measured with a 5.07/10g Semmes Weinstein Monofilament at all pedal  sites bilateral . Vibratory sensation diminished bilateral with tuning fork. No Babinski sign present bilateral.   Musculoskeletal: Mild asymptomatic bunion and hammer  deformities noted bilateral. Muscular strength 5/5 in all lower extremity muscular groups bilateral without pain or limitation on range of motion . No tenderness with calf compression bilateral.  Assessment and Plan: Problem List Items Addressed This Visit      Endocrine   Type 2 diabetes mellitus ([place])    Other Visit Diagnoses    Dermatophytosis of nail    -  Primary   Pain of toe, unspecified laterality          -Examined patient. -Discussed and educated patient on diabetic foot care, especially with  regards to the vascular, neurological and musculoskeletal systems.  -Stressed the importance of good glycemic control and the detriment of not  controlling glucose levels in relation to the foot. -Mechanically debrided all nails 1-5 bilateral using sterile nail nipper and filed with dremel without incident  -Continue with silicone toe cap to protect 5th toes bilateral; recommend good  supportive shoes daily. [employer] diabetic shoe order form was completed; office to contact primary care for approval / certification;  Office to arrange shoe fitting and dispensing. -Answered all patient questions -Patient to return in 3 months for at risk foot care -Patient advised to call the office if any problems or questions arise in the meantime.  Landis Martins, DPM

## 2016-02-25 ENCOUNTER — Inpatient Hospital Stay: Payer: Medicare Other | Attending: Internal Medicine

## 2016-02-25 ENCOUNTER — Inpatient Hospital Stay: Payer: Medicare Other

## 2016-02-25 DIAGNOSIS — D751 Secondary polycythemia: Secondary | ICD-10-CM | POA: Diagnosis not present

## 2016-02-25 LAB — HEMATOCRIT: HEMATOCRIT: 49.3 % (ref 40.0–52.0)

## 2016-02-25 LAB — HEMOGLOBIN: HEMOGLOBIN: 16.8 g/dL (ref 13.0–18.0)

## 2016-02-29 ENCOUNTER — Other Ambulatory Visit: Payer: Self-pay | Admitting: Family Medicine

## 2016-02-29 DIAGNOSIS — F32A Depression, unspecified: Secondary | ICD-10-CM

## 2016-02-29 DIAGNOSIS — F329 Major depressive disorder, single episode, unspecified: Secondary | ICD-10-CM

## 2016-02-29 DIAGNOSIS — F419 Anxiety disorder, unspecified: Principal | ICD-10-CM

## 2016-03-18 ENCOUNTER — Telehealth: Payer: Self-pay | Admitting: Sports Medicine

## 2016-03-18 NOTE — Telephone Encounter (Signed)
Refill is appropriate

## 2016-03-18 NOTE — Telephone Encounter (Signed)
Todd with [employer]. Called stating the [facility] has requested a refill for Ketoconazole cream 2%.

## 2016-03-19 ENCOUNTER — Other Ambulatory Visit: Payer: Self-pay

## 2016-03-19 DIAGNOSIS — B353 Tinea pedis: Secondary | ICD-10-CM

## 2016-03-19 MED ORDER — KETOCONAZOLE 2 % EX CREA: 1.0000 "application " | TOPICAL_CREAM | Freq: Every day | CUTANEOUS | 2 refills | Status: AC

## 2016-03-28 ENCOUNTER — Ambulatory Visit (HOSPITAL_BASED_OUTPATIENT_CLINIC_OR_DEPARTMENT_OTHER): Payer: Medicare Other | Attending: Physician Assistant | Admitting: Internal Medicine

## 2016-03-28 VITALS — Ht 67.0 in | Wt 196.0 lb

## 2016-03-28 DIAGNOSIS — G4730 Sleep apnea, unspecified: Secondary | ICD-10-CM

## 2016-03-28 DIAGNOSIS — G4731 Primary central sleep apnea: Secondary | ICD-10-CM | POA: Diagnosis not present

## 2016-03-28 DIAGNOSIS — G4733 Obstructive sleep apnea (adult) (pediatric): Secondary | ICD-10-CM | POA: Diagnosis not present

## 2016-03-28 DIAGNOSIS — G4710 Hypersomnia, unspecified: Secondary | ICD-10-CM

## 2016-03-28 DIAGNOSIS — R0683 Snoring: Secondary | ICD-10-CM

## 2016-03-28 DIAGNOSIS — R5383 Other fatigue: Secondary | ICD-10-CM

## 2016-03-31 ENCOUNTER — Other Ambulatory Visit: Payer: Self-pay | Admitting: Family Medicine

## 2016-03-31 ENCOUNTER — Other Ambulatory Visit (HOSPITAL_BASED_OUTPATIENT_CLINIC_OR_DEPARTMENT_OTHER): Payer: Self-pay

## 2016-03-31 DIAGNOSIS — R0683 Snoring: Secondary | ICD-10-CM

## 2016-03-31 DIAGNOSIS — R5383 Other fatigue: Secondary | ICD-10-CM

## 2016-03-31 DIAGNOSIS — Z794 Long term (current) use of insulin: Principal | ICD-10-CM

## 2016-03-31 DIAGNOSIS — E1142 Type 2 diabetes mellitus with diabetic polyneuropathy: Secondary | ICD-10-CM

## 2016-03-31 DIAGNOSIS — G4730 Sleep apnea, unspecified: Secondary | ICD-10-CM

## 2016-03-31 DIAGNOSIS — G4710 Hypersomnia, unspecified: Secondary | ICD-10-CM

## 2016-04-05 DIAGNOSIS — R0683 Snoring: Secondary | ICD-10-CM | POA: Diagnosis not present

## 2016-04-05 NOTE — Procedures (Signed)
Patient Name: Colton Carter, Colton Carter Date: 03/28/2016 Gender: Male D.O.B: 08-04-1945 Age (years): 70 Referring Provider: Eliseo Squires Height (inches): 67 Interpreting Physician: Baird Lyons MD, ABSM Weight (lbs): 194 RPSGT: Gerhard Perches BMI: 30 MRN: [id number] Neck Size: 18.00 CLINICAL INFORMATION The patient is referred for a split night study with BPAP.  MEDICATIONS Medications taken by the patient : charted for review Medications administered by patient during sleep study :  GABAPENTIN, FLEXERIL, ZOLOFT, HYDROCODONE W/ACETAMINOPHEN  SLEEP STUDY TECHNIQUE As per the AASM Manual for the Scoring of Sleep and Associated Events v2.3 (April 2016) with a hypopnea requiring 4% desaturations. The channels recorded and monitored were frontal, central and occipital EEG, electrooculogram (EOG), submentalis EMG (chin), nasal and oral airflow, thoracic and abdominal wall motion, anterior tibialis EMG, snore microphone, electrocardiogram, and pulse oximetry. Bi-level positive airway pressure (BiPAP) was initiated when the patient met split night criteria and was titrated according to treat sleep-disordered breathing.  RESPIRATORY PARAMETERS Diagnostic Total AHI (/hr): 40.4 RDI (/hr): 40.9 OA Index (/hr): 11.8 CA Index (/hr): 11.3 REM AHI (/hr): 51.4 NREM AHI (/hr): 38.3 Supine AHI (/hr): 42.2 Non-supine AHI (/hr): 39.18 Min O2 Sat (%): 80.00 Mean O2 (%): 88.72 Time below 88% (min): 45.8   Titration Optimal IPAP Pressure (cm): 17 Optimal EPAP Pressure (cm): 13 AHI at Optimal Pressure (/hr): 14.2 Min O2 at Optimal Pressure (%): 87.0 Sleep % at Optimal (%): 93 Supine % at Optimal (%): 0     SLEEP ARCHITECTURE The study was initiated at 10:47:12 PM and terminated at 5:16:01 AM. The total recorded time was 388.8 minutes. EEG confirmed total sleep time was 287.0 minutes yielding a sleep efficiency of 73.8%. Sleep onset after lights out was 32.9 minutes with a REM latency of 113.5 minutes. The  patient spent 17.42% of the night in stage N1 sleep, 66.72% in stage N2 sleep, 0.00% in stage N3 and 15.85% in REM. Wake after sleep onset (WASO) was 68.9 minutes. The Arousal Index was 41.8/hour.  LEG MOVEMENT DATA The total Periodic Limb Movements of Sleep (PLMS) were 1. The PLMS index was 0.21 .  CARDIAC DATA The 2 lead EKG demonstrated sinus rhythm. The mean heart rate was 80.68 beats per minute. Other EKG findings include: PVCs.  IMPRESSIONS - Severe obstructive sleep apnea occurred during the diagnostic portion of the study (AHI = 40.4 /hour). An optimal BiPAP       pressure was selected for this patient ( 18 /14 cm of water) - Severe central sleep apnea occurred during the diagnostic portion of the study (CAI = 11.3/hour). - BIPAP was initiated because of high pressure requirement, and to address Central apneas. - Moderate oxygen desaturation was noted during the diagnostic portion of the study (Min O2 = 80.00%). - The patient snored with Soft snoring volume during the diagnostic portion of the study. - EKG findings include PVCs. - Clinically significant periodic limb movements of sleep did not occur during the study.  DIAGNOSIS - Obstructive Sleep Apnea (327.23 [G47.33 ICD-10]) - Central Sleep Apnea (327.27 [G47.37 ICD-10])  RECOMMENDATIONS - Trial of BiPAP therapy on 18/14 cm H2O with a Medium size Resmed Full Face Mask AirFit F20 mask and heated humidification. - Avoid alcohol, sedatives and other CNS depressants that may worsen sleep apnea and disrupt normal sleep architecture. - Sleep hygiene should be reviewed to assess factors that may improve sleep quality. - Weight management and regular exercise should be initiated or continued.  [Electronically signed] 04/05/2016 11:29 AM  Baird Lyons MD, ABSM Diplomate,  American Board of Sleep Medicine   NPI: [id number]  [place], American Board of Sleep Medicine  ELECTRONICALLY SIGNED ON:  04/05/2016, 11:24  AM [place] PH: [phone]   FX: [phone] [place]

## 2016-04-09 ENCOUNTER — Other Ambulatory Visit: Payer: Self-pay | Admitting: *Deleted

## 2016-04-09 MED ORDER — SIMVASTATIN 40 MG PO TABS: 20.0000 mg | ORAL_TABLET | Freq: Every day | ORAL | 3 refills | Status: AC

## 2016-04-12 ENCOUNTER — Other Ambulatory Visit: Payer: Self-pay | Admitting: Family Medicine

## 2016-04-12 DIAGNOSIS — M542 Cervicalgia: Principal | ICD-10-CM

## 2016-04-12 DIAGNOSIS — G8929 Other chronic pain: Secondary | ICD-10-CM

## 2016-04-15 ENCOUNTER — Other Ambulatory Visit: Payer: Self-pay | Admitting: Family Medicine

## 2016-04-15 MED ORDER — LEVOCETIRIZINE DIHYDROCHLORIDE 5 MG PO TABS: 5.0000 mg | ORAL_TABLET | Freq: Every evening | ORAL | 2 refills | Status: AC

## 2016-04-28 ENCOUNTER — Inpatient Hospital Stay: Payer: Medicare Other

## 2016-04-28 ENCOUNTER — Encounter: Payer: Self-pay | Admitting: Internal Medicine

## 2016-04-28 ENCOUNTER — Inpatient Hospital Stay: Payer: Medicare Other | Attending: Internal Medicine

## 2016-04-28 ENCOUNTER — Other Ambulatory Visit: Payer: Self-pay

## 2016-04-28 ENCOUNTER — Inpatient Hospital Stay (HOSPITAL_BASED_OUTPATIENT_CLINIC_OR_DEPARTMENT_OTHER): Payer: Medicare Other | Admitting: Internal Medicine

## 2016-04-28 DIAGNOSIS — R5383 Other fatigue: Secondary | ICD-10-CM | POA: Insufficient documentation

## 2016-04-28 DIAGNOSIS — J449 Chronic obstructive pulmonary disease, unspecified: Secondary | ICD-10-CM | POA: Diagnosis not present

## 2016-04-28 DIAGNOSIS — I739 Peripheral vascular disease, unspecified: Secondary | ICD-10-CM | POA: Diagnosis not present

## 2016-04-28 DIAGNOSIS — R3915 Urgency of urination: Secondary | ICD-10-CM

## 2016-04-28 DIAGNOSIS — I999 Unspecified disorder of circulatory system: Secondary | ICD-10-CM

## 2016-04-28 DIAGNOSIS — E669 Obesity, unspecified: Secondary | ICD-10-CM | POA: Insufficient documentation

## 2016-04-28 DIAGNOSIS — J439 Emphysema, unspecified: Secondary | ICD-10-CM

## 2016-04-28 DIAGNOSIS — K760 Fatty (change of) liver, not elsewhere classified: Secondary | ICD-10-CM | POA: Diagnosis not present

## 2016-04-28 DIAGNOSIS — M542 Cervicalgia: Secondary | ICD-10-CM

## 2016-04-28 DIAGNOSIS — N189 Chronic kidney disease, unspecified: Secondary | ICD-10-CM | POA: Diagnosis not present

## 2016-04-28 DIAGNOSIS — E7800 Pure hypercholesterolemia, unspecified: Secondary | ICD-10-CM | POA: Insufficient documentation

## 2016-04-28 DIAGNOSIS — M129 Arthropathy, unspecified: Secondary | ICD-10-CM

## 2016-04-28 DIAGNOSIS — N529 Male erectile dysfunction, unspecified: Secondary | ICD-10-CM

## 2016-04-28 DIAGNOSIS — R911 Solitary pulmonary nodule: Secondary | ICD-10-CM | POA: Diagnosis not present

## 2016-04-28 DIAGNOSIS — D751 Secondary polycythemia: Secondary | ICD-10-CM

## 2016-04-28 DIAGNOSIS — E119 Type 2 diabetes mellitus without complications: Secondary | ICD-10-CM

## 2016-04-28 DIAGNOSIS — F17210 Nicotine dependence, cigarettes, uncomplicated: Secondary | ICD-10-CM

## 2016-04-28 DIAGNOSIS — I129 Hypertensive chronic kidney disease with stage 1 through stage 4 chronic kidney disease, or unspecified chronic kidney disease: Secondary | ICD-10-CM | POA: Insufficient documentation

## 2016-04-28 DIAGNOSIS — D696 Thrombocytopenia, unspecified: Secondary | ICD-10-CM

## 2016-04-28 DIAGNOSIS — Z79899 Other long term (current) drug therapy: Secondary | ICD-10-CM | POA: Insufficient documentation

## 2016-04-28 DIAGNOSIS — Z794 Long term (current) use of insulin: Secondary | ICD-10-CM | POA: Insufficient documentation

## 2016-04-28 DIAGNOSIS — M069 Rheumatoid arthritis, unspecified: Secondary | ICD-10-CM | POA: Diagnosis not present

## 2016-04-28 DIAGNOSIS — I10 Essential (primary) hypertension: Secondary | ICD-10-CM

## 2016-04-28 DIAGNOSIS — K219 Gastro-esophageal reflux disease without esophagitis: Secondary | ICD-10-CM | POA: Diagnosis not present

## 2016-04-28 DIAGNOSIS — K5790 Diverticulosis of intestine, part unspecified, without perforation or abscess without bleeding: Secondary | ICD-10-CM

## 2016-04-28 DIAGNOSIS — I2510 Atherosclerotic heart disease of native coronary artery without angina pectoris: Secondary | ICD-10-CM | POA: Insufficient documentation

## 2016-04-28 DIAGNOSIS — Z8719 Personal history of other diseases of the digestive system: Secondary | ICD-10-CM | POA: Insufficient documentation

## 2016-04-28 DIAGNOSIS — N400 Enlarged prostate without lower urinary tract symptoms: Secondary | ICD-10-CM | POA: Diagnosis not present

## 2016-04-28 DIAGNOSIS — Z85828 Personal history of other malignant neoplasm of skin: Secondary | ICD-10-CM | POA: Insufficient documentation

## 2016-04-28 LAB — CBC WITH DIFFERENTIAL/PLATELET
Basophils Absolute: 0.1 10*3/uL (ref 0–0.1)
Basophils Relative: 1 %
EOS ABS: 0.6 10*3/uL (ref 0–0.7)
Eosinophils Relative: 8 %
HCT: 48.4 % (ref 40.0–52.0)
HEMOGLOBIN: 16.7 g/dL (ref 13.0–18.0)
LYMPHS ABS: 2.3 10*3/uL (ref 1.0–3.6)
Lymphocytes Relative: 30 %
MCH: 29.6 pg (ref 26.0–34.0)
MCHC: 34.4 g/dL (ref 32.0–36.0)
MCV: 85.9 fL (ref 80.0–100.0)
Monocytes Absolute: 0.6 10*3/uL (ref 0.2–1.0)
Monocytes Relative: 8 %
NEUTROS PCT: 53 %
Neutro Abs: 4 10*3/uL (ref 1.4–6.5)
Platelets: 109 10*3/uL — ABNORMAL LOW (ref 150–440)
RBC: 5.64 MIL/uL (ref 4.40–5.90)
RDW: 14.9 % — ABNORMAL HIGH (ref 11.5–14.5)
WBC: 7.6 10*3/uL (ref 3.8–10.6)

## 2016-04-28 NOTE — Progress Notes (Signed)
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   # CHRONIC THROMBOCYTOPENIA- CT 2015- negative for spleen >100k on surviellance  # April 2016- LUNG NODULES [Lung screening program]  INTERVAL HISTORY:   A pleasant 70 year old Caucasian male history of erythrocytosis secondary to smoking is here for follow-up. Patient notes improvement of fatigue and headaches- post phlebotomy. Currently notes to have intermittent headaches. Denies any visual symptoms.  He unfortunately continues to smoke. Denies any bleeding.    REVIEW OF SYSTEMS: is no chest pain or shortness of breath. Patient's chronic arthritis. No blood clots. No strokes.  PAST MEDICAL HISTORY :  Past Medical History:  Diagnosis Date  . Allergy   . Arthritis   . BPH (benign prostatic hyperplasia)   . Chronic neck pain   . Chronic prostatitis   . CKD (chronic kidney disease)   . Collagen vascular disease (HCC)    RA  . COPD (chronic obstructive pulmonary disease) ([place])   . Coronary artery disease   . Diabetes mellitus   . Diverticulosis   . ED (erectile dysfunction)   . Emphysema of lung ([place])   . Fatty liver   . GERD (gastroesophageal reflux disease)   . Headache   . High cholesterol   . Hypertension   . Hypogonadism in male   . Obesity   . Pancreatitis   . Peripheral vascular disease ([place])   . Personal history of tobacco use, presenting hazards to health 11/16/2015  . Phimosis   . Polycythemia, secondary 01/10/2015  . Renal disorder   . Skin cancer 2014   nose  . Stomach ulcer    in past  . Urinary frequency   . Urinary urgency     PAST  SURGICAL HISTORY :   Past Surgical History:  Procedure Laterality Date  . APPENDECTOMY  1980  . CERVICAL FUSION    . CHOLECYSTECTOMY  2006  . COLONOSCOPY  2011   Dr [name]  . COLONOSCOPY WITH PROPOFOL N/A 07/11/2015   Procedure: COLONOSCOPY WITH PROPOFOL;  Surgeon: Christene Lye, MD;  Location: [facility] END[facility];  Service: Endoscopy;  Laterality: N/A;  . ESOPHAGOGASTRODUODENOSCOPY (EGD) WITH PROPOFOL N/A 07/11/2015   Procedure: ESOPHAGOGASTRODUODENOSCOPY (EGD) WITH PROPOFOL;  Surgeon: Christene Lye, MD;  Location: [facility] ENDOSCOPY;  Service: Endoscopy;  Laterality: N/A;  . PERIPHERAL VASCULAR CATHETERIZATION N/A 02/06/2015   Procedure: Abdominal Aortogram w/Lower Extremity;  Surgeon: Katha Cabal, MD;  Location: [place] [facility];  Service: Cardiovascular;  Laterality: N/A;  . PERIPHERAL VASCULAR CATHETERIZATION  02/06/2015   Procedure: Lower Extremity Intervention;  Surgeon: Katha Cabal, MD;  Location: [place] [facility];  Service: Cardiovascular;;  . PERIPHERAL VASCULAR CATHETERIZATION Left 03/20/2015   Procedure: Lower Extremity Angiography;  Surgeon: Katha Cabal, MD;  Location: [place] [facility];  Service: Cardiovascular;  Laterality: Left;  . PERIPHERAL VASCULAR CATHETERIZATION  03/20/2015   Procedure: Lower Extremity Intervention;  Surgeon: Katha Cabal, MD;  Location: [place] [facility];  Service: Cardiovascular;;  . SKIN CANCER EXCISION  2014  . UPPER GI ENDOSCOPY      FAMILY HISTORY :   Family History  Problem Relation Age of Onset  .  Pancreatic cancer Father   . Cancer Father   . Stroke Father   . Heart disease Mother   . Diabetes Mother   . Depression Mother   . Kidney disease Neg Hx     SOCIAL HISTORY:   Social History  Substance Use Topics  . Smoking status: Current Every Day Smoker    Packs/day: 0.25    Years: 50.00    Types: Cigarettes  . Smokeless tobacco: Never Used     Comment: 5 cigarettes a day  . Alcohol use No     ALLERGIES:  is allergic to levaquin [levofloxacin in d5w]; milk-related compounds; and septra ds [sulfamethoxazole-trimethoprim].  MEDICATIONS:  Current Outpatient Prescriptions  Medication Sig Dispense Refill  . ADVAIR DISKUS 250-50 MCG/DOSE AEPB INHALE ONE PUFF EVERY 12 HOURS 60 each 11  . albuterol (PROVENTIL) (2.5 MG/3ML) 0.083% nebulizer solution Take 3 mLs (2.5 mg total) by nebulization every 6 (six) hours as needed for wheezing or shortness of breath. 150 mL 1  . Alpha-Lipoic Acid 600 MG CAPS Take 1 capsule (600 mg total) by mouth daily. 30 each 11  . atenolol (TENORMIN) 25 MG tablet Take 1 tablet (25 mg total) by mouth daily. 90 tablet 3  . baclofen (LIORESAL) 10 MG tablet TAKE 1 TABLET BY MOUTH EVERY 8 HOURS 90 each 3  . busPIRone (BUSPAR) 7.5 MG tablet Take 1 tablet (7.5 mg total) by mouth 2 (two) times daily. 60 tablet 11  . canagliflozin (INVOKANA) 100 MG TABS tablet Take 1 tablet (100 mg total) by mouth daily before breakfast. 90 tablet 3  . capsaicin (ZOSTRIX) 0.025 % cream Apply topically 2 (two) times daily. To feet. 60 g 11  . Cholecalciferol (VITAMIN D-3) 5000 UNITS TABS Take by mouth 2 (two) times daily.    Scarlette Shorts SURECLICK 50 MG/ML injection     . furosemide (LASIX) 40 MG tablet TAKE ONE (1) TABLET BY MOUTH EVERY DAY 30 tablet 11  . gabapentin (NEURONTIN) 600 MG tablet TAKE ONE (1) TABLET FOUR (4) TIMES PER DAY 120 tablet 11  . HYDROcodone-acetaminophen (NORCO) 10-325 MG tablet Take 1 tablet by mouth every 6 (six) hours as needed.    . insulin aspart (NOVOLOG) 100 UNIT/ML injection Inject into skin per Sliding scale three times a day at mealtimes 10 mL 11  . Insulin Pen Needle (B-D ULTRAFINE III SHORT PEN) 31G X 8 MM MISC USE ONE EACH 5 TIMES PER DAY WITH INSULIN. 100 each 11  . ketoconazole (NIZORAL) 2 % cream Apply 1 application topically daily. 60 g 2  . LANTUS SOLOSTAR 100 UNIT/ML Solostar Pen INJECT 65 UNITS SUBCUTANEOUSLY TWICE DAILY 45 mL 3  . levocetirizine  ([name]) 5 MG tablet Take 1 tablet (5 mg total) by mouth every evening. 30 tablet 2  . montelukast (SINGULAIR) 10 MG tablet Take 1 tablet (10 mg total) by mouth at bedtime. 90 tablet 3  . niacin 500 MG tablet Take 500 mg by mouth at bedtime.    Marland Kitchen NOVOLOG FLEXPEN 100 UNIT/ML FlexPen USE AS DIRECTED PER SLIDING SCALE 100-150-5 UNITS, 151-200-7 UNITS, 201- 250-9 UNITS, 251-300-11 UNITS, 301-350- 13 UNITS, 351-400-15 UNITS 15 mL 11  . Omega-3 Fatty Acids (FISH OIL) 1000 MG CAPS Take by mouth 2 (two) times daily. Reported on 10/26/2015    . omeprazole (PRILOSEC) 20 MG capsule Take 1 capsule (20 mg total) by mouth 2 (two) times daily before a meal. 60 capsule 11  . polyethylene glycol powder (GLYCOLAX/MIRALAX) powder 255 grams one bottle for  colonoscopy prep 255 g 0  . polyethylene glycol powder (GLYCOLAX/MIRALAX) powder MIX 17GM (= 1 CAPFUL) IN LIQUID AND TAKEONE TIME DAILY AS NEEDED 527 g 5  . predniSONE (DELTASONE) 5 MG tablet     . senna (SENOKOT) 8.6 MG tablet Take 1 tablet by mouth daily.    . sertraline (ZOLOFT) 100 MG tablet TAKE 1 TABLET BY MOUTH ONCE DAILY 90 tablet 3  . simvastatin (ZOCOR) 40 MG tablet Take 0.5 tablets (20 mg total) by mouth daily at 6 PM. 90 tablet 3  . tamsulosin (FLOMAX) 0.4 MG CAPS capsule Take 1 capsule (0.4 mg total) by mouth daily. 90 capsule 3  . ULORIC 80 MG TABS Take 1 tablet by mouth every other day.    . VENTOLIN HFA 108 (90 Base) MCG/ACT inhaler INHALE TWO PUFFS EVERY 4-6 HOURS 18 g 3  . vitamin B-12 (CYANOCOBALAMIN) 500 MCG tablet Take 1,000 mcg by mouth daily.    . vitamin C (ASCORBIC ACID) 500 MG tablet Take 500 mg by mouth daily.    . VOLTAREN 1 % GEL APPLY 2 GRAMS TO AFFECTED AREA(S) 4 TIMES DAILY 100 g 0   Current Facility-Administered Medications  Medication Dose Route Frequency Provider Last Rate Last Dose  . ipratropium-albuterol (DUONEB) 0.5-2.5 (3) MG/3ML nebulizer solution 3 mL  3 mL Nebulization Q6H Amy Lauren Krebs, NP        PHYSICAL  EXAMINATION:  BP 122/69 (BP Location: Left Arm, Patient Position: Sitting)   Pulse 62   Temp (!) 96 F (35.6 C)   Resp 18   Ht 5\' 7"  (1.702 m)   Wt 203 lb 9.6 oz (92.4 kg)   SpO2 92%   BMI 31.89 kg/m   Filed Weights   04/28/16 1032  Weight: 203 lb 9.6 oz (92.4 kg)    GENERAL: Well-nourished well-developed; Alert, no distress and comfortable.  Alone.  EYES: no pallor or icterus OROPHARYNX: no thrush or ulceration NECK: supple, no masses felt LYMPH:  no palpable lymphadenopathy in the cervical, axillary or inguinal regions LUNGS: decreased breath sounds bilaterally. No wheeze or crackles HEART/CVS: regular rate & rhythm and no murmurs; No lower extremity edema ABDOMEN:abdomen soft, non-tender and normal bowel sounds Musculoskeletal:no cyanosis of digits and no clubbing  PSYCH: alert & oriented x 3 with fluent speech NEURO: no focal motor/sensory deficits SKIN:  no rashes or significant lesions  LABORATORY DATA:  I have reviewed the data as listed    Component Value Date/Time   NA 142 02/08/2016 1016   NA 146 (H) 11/19/2015 0810   NA 140 03/24/2014 1308   K 4.2 02/08/2016 1016   K 3.6 03/24/2014 1308   CL 107 02/08/2016 1016   CL 105 03/24/2014 1308   CO2 28 02/08/2016 1016   CO2 28 03/24/2014 1308   GLUCOSE 133 (H) 02/08/2016 1016   GLUCOSE 155 (H) 03/24/2014 1308   BUN 21 02/08/2016 1016   BUN 22 11/19/2015 0810   BUN 17 03/24/2014 1308   CREATININE 1.32 (H) 02/08/2016 1016   CALCIUM 8.9 02/08/2016 1016   CALCIUM 8.1 (L) 03/24/2014 1308   PROT 6.6 02/08/2016 1016   PROT 6.8 11/19/2015 0810   PROT 6.4 03/24/2014 1308   ALBUMIN 4.2 02/08/2016 1016   ALBUMIN 4.3 11/19/2015 0810   ALBUMIN 3.0 (L) 03/24/2014 1308   AST 16 02/08/2016 1016   AST 34 03/24/2014 1308   ALT 14 02/08/2016 1016   ALT 34 03/24/2014 1308   ALKPHOS 66 02/08/2016 1016  ALKPHOS 104 03/24/2014 1308   BILITOT 0.6 02/08/2016 1016   BILITOT 0.3 11/19/2015 0810   BILITOT 0.4 03/24/2014 1308    GFRNONAA 55 (L) 02/08/2016 1016   GFRAA 63 02/08/2016 1016    No results found for: SPEP, UPEP  Lab Results  Component Value Date   WBC 7.6 04/28/2016   NEUTROABS 4.0 04/28/2016   HGB 16.7 04/28/2016   HCT 48.4 04/28/2016   MCV 85.9 04/28/2016   PLT 109 (L) 04/28/2016      Chemistry      Component Value Date/Time   NA 142 02/08/2016 1016   NA 146 (H) 11/19/2015 0810   NA 140 03/24/2014 1308   K 4.2 02/08/2016 1016   K 3.6 03/24/2014 1308   CL 107 02/08/2016 1016   CL 105 03/24/2014 1308   CO2 28 02/08/2016 1016   CO2 28 03/24/2014 1308   BUN 21 02/08/2016 1016   BUN 22 11/19/2015 0810   BUN 17 03/24/2014 1308   CREATININE 1.32 (H) 02/08/2016 1016      Component Value Date/Time   CALCIUM 8.9 02/08/2016 1016   CALCIUM 8.1 (L) 03/24/2014 1308   ALKPHOS 66 02/08/2016 1016   ALKPHOS 104 03/24/2014 1308   AST 16 02/08/2016 1016   AST 34 03/24/2014 1308   ALT 14 02/08/2016 1016   ALT 34 03/24/2014 1308   BILITOT 0.6 02/08/2016 1016   BILITOT 0.3 11/19/2015 0810   BILITOT 0.4 03/24/2014 1308        ASSESSMENT & PLAN:   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 Patient symptomatically feels better post phlebotomy. I recommend phlebotomy if hematocrit is greater than 46. Today hematocrit is 48 proceed with phlebotomy.  # Smoker- recommend quitting smoking. Patient has been cutting down on smoking.  # Chronic mild thrombocytopenia platelets ~100,000 at least since 2013. Monitor for now. CT scan 2015 negative for splenomegaly.   # Labs H&H every 2 months possible phlebotomy; follow-up with me in 6 months.       Colton Sickle, MD 04/28/2016 1:37 PM

## 2016-04-28 NOTE — Progress Notes (Signed)
No major concerns today.  Has had increased headaches

## 2016-04-28 NOTE — Assessment & Plan Note (Addendum)
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 Patient symptomatically feels better post phlebotomy. I recommend phlebotomy if hematocrit is greater than 46. Today hematocrit is 48 proceed with phlebotomy.  # Smoker- recommend quitting smoking. Patient has been cutting down on smoking.  # Chronic mild thrombocytopenia platelets ~100,000 at least since 2013. Monitor for now. CT scan 2015 negative for splenomegaly.   # Labs H&H every 2 months possible phlebotomy; follow-up with me in 6 months.

## 2016-05-16 ENCOUNTER — Ambulatory Visit: Payer: Medicare Other | Admitting: Urology

## 2016-05-19 ENCOUNTER — Encounter: Payer: Self-pay | Admitting: Urology

## 2016-05-19 ENCOUNTER — Ambulatory Visit (INDEPENDENT_AMBULATORY_CARE_PROVIDER_SITE_OTHER): Payer: Medicare Other | Admitting: Urology

## 2016-05-19 ENCOUNTER — Other Ambulatory Visit (INDEPENDENT_AMBULATORY_CARE_PROVIDER_SITE_OTHER): Payer: Self-pay | Admitting: Vascular Surgery

## 2016-05-19 ENCOUNTER — Ambulatory Visit (INDEPENDENT_AMBULATORY_CARE_PROVIDER_SITE_OTHER): Payer: Medicare Other | Admitting: Family Medicine

## 2016-05-19 ENCOUNTER — Encounter: Payer: Self-pay | Admitting: Family Medicine

## 2016-05-19 VITALS — BP 128/74 | HR 83 | Ht 67.0 in | Wt 200.0 lb

## 2016-05-19 VITALS — BP 138/74 | HR 60 | Temp 98.1°F | Resp 16 | Ht 67.0 in | Wt 200.6 lb

## 2016-05-19 DIAGNOSIS — G8929 Other chronic pain: Secondary | ICD-10-CM | POA: Diagnosis not present

## 2016-05-19 DIAGNOSIS — E1142 Type 2 diabetes mellitus with diabetic polyneuropathy: Secondary | ICD-10-CM | POA: Diagnosis not present

## 2016-05-19 DIAGNOSIS — E291 Testicular hypofunction: Secondary | ICD-10-CM | POA: Diagnosis not present

## 2016-05-19 DIAGNOSIS — E118 Type 2 diabetes mellitus with unspecified complications: Secondary | ICD-10-CM | POA: Diagnosis not present

## 2016-05-19 DIAGNOSIS — I10 Essential (primary) hypertension: Secondary | ICD-10-CM

## 2016-05-19 DIAGNOSIS — N183 Chronic kidney disease, stage 3 unspecified: Secondary | ICD-10-CM

## 2016-05-19 DIAGNOSIS — I739 Peripheral vascular disease, unspecified: Secondary | ICD-10-CM

## 2016-05-19 DIAGNOSIS — L989 Disorder of the skin and subcutaneous tissue, unspecified: Secondary | ICD-10-CM | POA: Diagnosis not present

## 2016-05-19 DIAGNOSIS — F32A Depression, unspecified: Secondary | ICD-10-CM

## 2016-05-19 DIAGNOSIS — N529 Male erectile dysfunction, unspecified: Secondary | ICD-10-CM | POA: Diagnosis not present

## 2016-05-19 DIAGNOSIS — M79604 Pain in right leg: Secondary | ICD-10-CM

## 2016-05-19 DIAGNOSIS — N138 Other obstructive and reflux uropathy: Secondary | ICD-10-CM | POA: Diagnosis not present

## 2016-05-19 DIAGNOSIS — E1122 Type 2 diabetes mellitus with diabetic chronic kidney disease: Secondary | ICD-10-CM | POA: Diagnosis not present

## 2016-05-19 DIAGNOSIS — N400 Enlarged prostate without lower urinary tract symptoms: Secondary | ICD-10-CM

## 2016-05-19 DIAGNOSIS — N401 Enlarged prostate with lower urinary tract symptoms: Secondary | ICD-10-CM

## 2016-05-19 DIAGNOSIS — Z794 Long term (current) use of insulin: Secondary | ICD-10-CM | POA: Diagnosis not present

## 2016-05-19 DIAGNOSIS — F418 Other specified anxiety disorders: Secondary | ICD-10-CM | POA: Diagnosis not present

## 2016-05-19 DIAGNOSIS — I708 Atherosclerosis of other arteries: Principal | ICD-10-CM

## 2016-05-19 DIAGNOSIS — I700 Atherosclerosis of aorta: Secondary | ICD-10-CM

## 2016-05-19 DIAGNOSIS — F329 Major depressive disorder, single episode, unspecified: Secondary | ICD-10-CM

## 2016-05-19 DIAGNOSIS — Z23 Encounter for immunization: Secondary | ICD-10-CM

## 2016-05-19 DIAGNOSIS — M79605 Pain in left leg: Secondary | ICD-10-CM

## 2016-05-19 DIAGNOSIS — F419 Anxiety disorder, unspecified: Secondary | ICD-10-CM

## 2016-05-19 LAB — BLADDER SCAN AMB NON-IMAGING: Scan Result: 98

## 2016-05-19 MED ORDER — BUSPIRONE HCL 10 MG PO TABS: 10.0000 mg | ORAL_TABLET | Freq: Two times a day (BID) | ORAL | 0 refills | Status: DC

## 2016-05-19 MED ORDER — PREGABALIN 50 MG PO CAPS: 50.0000 mg | ORAL_CAPSULE | Freq: Three times a day (TID) | ORAL | 1 refills | Status: DC

## 2016-05-19 NOTE — Patient Instructions (Signed)
Thank you for coming in to clinic today.  1. You received Pneumonia shot today, last booster needed. Flu shot today, you may get low grade aches and chills last within 24-48 hours, this should resolve. 2. We will check A1c blood test today, and send results to MyChart once available. Gradual adjust insulin dosages if slightly elevated from last time. Overall you are doing very well. 3. BP was slightly elevated, please ask your Kidney Doctor about possibility of restarting Lisinopril or an ACE / ARB medication in future, now that your kidneys have improved 4. Increased dose of Buspar, sent new rx to pharmacy for 10mg  tablets, take one twice a day now, and stop the old prescription 5. For next 2 days go ahead and reduce your Gabapentin dose to 1 capsule 3 times a day then 2 times a day then STOP on Day 3, and go ahead and start Lyrica 50mg  capsule once 3 times a day, every day until I see you back. We can likely increase this dose at your next visit if needed.  Please schedule a follow-up appointment with Dr. Parks Ranger in 1 month follow-up Neuropathy, Anxiety  If you have any other questions or concerns, please feel free to call the clinic or send a message through [place]. You may also schedule an earlier appointment if necessary.  Nobie Putnam, DO [place]

## 2016-05-19 NOTE — Assessment & Plan Note (Signed)
Chronic gradual worsening b/l DM neuropathy, previously controlled on gabapentin Followed by Podiatry ([facility]) F/u with Podiatry for DM foot exams Continue alpha lipoic acid, capsaicin cream as needed Titrate off Gabapentin (x 2 days) and switch on Day 3 to Lyrica 50mg  TID (#90 +1refill) printed and given to patient, lowest dose and will likely need to titrate up at next visit, max dose 300mg  daily, follow-up 3-4 weeks

## 2016-05-19 NOTE — Progress Notes (Signed)
05/19/2016 12:40 PM   Colton Carter. 1946/02/09 [id number]  Referring provider: [name], NP No address on file  Chief Complaint  Patient presents with  . Benign Prostatic Hypertrophy    3 month follow up  . Hypogonadism    HPI: Patient is a 70 year old Caucasian male who presents today for a three month follow up for his BPH with LUTS.  Hypogonadism Patient is experiencing a decrease in libido, a lack of energy, a decrease in strength, a loss in height, a decreased enjoyment in life, sadness and/or grumpiness, erections being less strong, a recent deterioration in an ability to play sports, falling asleep after dinner and a recent deterioration in their work performance.  This is indicated by his responses to the ADAM questionnaire.  A current morning serum testosterone level was found to be 318 ng/dL on 11/19/2015.  He was on testosterone cypionate in the past with Dr. Bernardo Heater, but it was discontinued by his nephrologist due to peripheral edema.  His Clay, LH and prolactin were found to be normal in 2010.    He has not obtained cardiac clearance to start treatments.    Erectile dysfunction His SHIM score is 8, which is moderate erectile dysfunction.   He has been having difficulty with erections for over three years.   His major complaint is a decrease in the size of his penis and lack of firmness with his erections.  His libido is diminished.   His risk factors for ED are hypogonadism, BPH, smoking, CAD, COPD, DM, HLD, CKD, chronic pain and polycythemia.  He denies any painful erections or curvatures with his erections.   He has tried PDE5-inhibitors in the past with limited success.  He has not checked with nephrology to see if taking medications was safe for him.  BPH WITH LUTS His IPSS score today is 18, which is moderate lower urinary tract symptomatology. He is mostly dissatisfied with his quality life due to his urinary symptoms. His PVR is 98 mL.  His previous PVR is  67 mL.   His previous IPSS was 25/5.  His major complaint today frequency, urgency, nocturia x 3, incontinence, intermittency and weak urinary stream.  He has had these symptoms for 8 or 9 months.  He denies any dysuria, hematuria or suprapubic pain.   He has had a biopsy of his prostate in 2007 due to a abnormal DRE.  The results were benign.  He also denies any recent fevers, chills, nausea or vomiting.  He does not have a family history of PCa.  He is drinking diet drinks, coffee and water.        IPSS    Row Name 05/19/16 1100         International Prostate Symptom Score   How often have you had the sensation of not emptying your bladder? Less than half the time     How often have you had to urinate less than every two hours? More than half the time     How often have you found you stopped and started again several times when you urinated? About half the time     How often have you found it difficult to postpone urination? About half the time     How often have you had a weak urinary stream? About half the time     How often have you had to strain to start urination? Less than 1 in 5 times     How many  times did you typically get up at night to urinate? 2 Times     Total IPSS Score 18       Quality of Life due to urinary symptoms   If you were to spend the rest of your life with your urinary condition just the way it is now how would you feel about that? Mostly Disatisfied        Score:  1-7 Mild 8-19 Moderate 20-35 Severe  PMH: Past Medical History:  Diagnosis Date  . Allergy   . Arthritis   . BPH (benign prostatic hyperplasia)   . Chronic neck pain   . Chronic prostatitis   . CKD (chronic kidney disease)   . Collagen vascular disease (HCC)    RA  . COPD (chronic obstructive pulmonary disease) ([place])   . Coronary artery disease   . Diabetes mellitus   . Diverticulosis   . ED (erectile dysfunction)   . Emphysema of lung ([place])   . Fatty liver   . GERD  (gastroesophageal reflux disease)   . Headache   . High cholesterol   . Hypertension   . Hypogonadism in male   . Obesity   . Pancreatitis   . Peripheral vascular disease ([place])   . Personal history of tobacco use, presenting hazards to health 11/16/2015  . Phimosis   . Polycythemia, secondary 01/10/2015  . Renal disorder   . Skin cancer 2014   nose  . Sleep apnea   . Stomach ulcer    in past  . Urinary frequency   . Urinary urgency     Surgical History: Past Surgical History:  Procedure Laterality Date  . APPENDECTOMY  1980  . CERVICAL FUSION    . CHOLECYSTECTOMY  2006  . COLONOSCOPY  2011   Dr [name]  . COLONOSCOPY WITH PROPOFOL N/A 07/11/2015   Procedure: COLONOSCOPY WITH PROPOFOL;  Surgeon: Christene Lye, MD;  Location: [facility];  Service: Endoscopy;  Laterality: N/A;  . ESOPHAGOGASTRODUODENOSCOPY (EGD) WITH PROPOFOL N/A 07/11/2015   Procedure: ESOPHAGOGASTRODUODENOSCOPY (EGD) WITH PROPOFOL;  Surgeon: Christene Lye, MD;  Location: [facility];  Service: Endoscopy;  Laterality: N/A;  . PERIPHERAL VASCULAR CATHETERIZATION N/A 02/06/2015   Procedure: Abdominal Aortogram w/Lower Extremity;  Surgeon: Katha Cabal, MD;  Location: [place] [facility];  Service: Cardiovascular;  Laterality: N/A;  . PERIPHERAL VASCULAR CATHETERIZATION  02/06/2015   Procedure: Lower Extremity Intervention;  Surgeon: Katha Cabal, MD;  Location: [place] [facility];  Service: Cardiovascular;;  . PERIPHERAL VASCULAR CATHETERIZATION Left 03/20/2015   Procedure: Lower Extremity Angiography;  Surgeon: Katha Cabal, MD;  Location: [place] [facility];  Service: Cardiovascular;  Laterality: Left;  . PERIPHERAL VASCULAR CATHETERIZATION  03/20/2015   Procedure: Lower Extremity Intervention;  Surgeon: Katha Cabal, MD;  Location: [place] [facility];  Service: Cardiovascular;;  . SKIN CANCER EXCISION  2014  . UPPER GI ENDOSCOPY      Home Medications:      Medication List       Accurate as of 05/19/16 12:40 PM. Always use your most recent med list.          ADVAIR DISKUS 250-50 MCG/DOSE Aepb Generic drug:  Fluticasone-Salmeterol INHALE ONE PUFF EVERY 12 HOURS   Alpha-Lipoic Acid 600 MG Caps Take 1 capsule (600 mg total) by mouth daily.   atenolol 25 MG tablet Commonly known as:  TENORMIN Take 1 tablet (25 mg total) by mouth daily.   baclofen 10 MG tablet Commonly known  as:  LIORESAL TAKE 1 TABLET BY MOUTH EVERY 8 HOURS   busPIRone 10 MG tablet Commonly known as:  BUSPAR Take 1 tablet (10 mg total) by mouth 2 (two) times daily.   canagliflozin 100 MG Tabs tablet Commonly known as:  INVOKANA Take 1 tablet (100 mg total) by mouth daily before breakfast.   capsaicin 0.025 % cream Commonly known as:  ZOSTRIX Apply topically 2 (two) times daily. To feet.   ENBREL SURECLICK 50 MG/ML injection Generic drug:  etanercept   Fish Oil 1000 MG Caps Take by mouth 2 (two) times daily. Reported on 10/26/2015   furosemide 40 MG tablet Commonly known as:  LASIX TAKE ONE (1) TABLET BY MOUTH EVERY DAY   HYDROcodone-acetaminophen 10-325 MG tablet Commonly known as:  NORCO Take 1 tablet by mouth every 6 (six) hours as needed.   Insulin Pen Needle 31G X 8 MM Misc Commonly known as:  B-D ULTRAFINE III SHORT PEN USE ONE EACH 5 TIMES PER DAY WITH INSULIN.   ketoconazole 2 % cream Commonly known as:  NIZORAL Apply 1 application topically daily.   LANTUS SOLOSTAR 100 UNIT/ML Solostar Pen Generic drug:  Insulin Glargine INJECT 65 UNITS SUBCUTANEOUSLY TWICE DAILY   levocetirizine 5 MG tablet Commonly known as:  [name] Take 1 tablet (5 mg total) by mouth every evening.   montelukast 10 MG tablet Commonly known as:  SINGULAIR Take 1 tablet (10 mg total) by mouth at bedtime.   niacin 500 MG tablet Take 500 mg by mouth at bedtime.   NOVOLOG FLEXPEN 100 UNIT/ML FlexPen Generic drug:  insulin aspart USE AS DIRECTED PER SLIDING SCALE  100-150-5 UNITS, 151-200-7 UNITS, 201- 250-9 UNITS, 251-300-11 UNITS, 301-350- 13 UNITS, 351-400-15 UNITS   insulin aspart 100 UNIT/ML injection Commonly known as:  novoLOG Inject into skin per Sliding scale three times a day at mealtimes   omeprazole 20 MG capsule Commonly known as:  PRILOSEC Take 1 capsule (20 mg total) by mouth 2 (two) times daily before a meal.   polyethylene glycol powder powder Commonly known as:  GLYCOLAX/MIRALAX 255 grams one bottle for colonoscopy prep   polyethylene glycol powder powder Commonly known as:  GLYCOLAX/MIRALAX MIX 17GM (= 1 CAPFUL) IN LIQUID AND TAKEONE TIME DAILY AS NEEDED   predniSONE 5 MG tablet Commonly known as:  DELTASONE   pregabalin 50 MG capsule Commonly known as:  LYRICA Take 1 capsule (50 mg total) by mouth 3 (three) times daily.   senna 8.6 MG tablet Commonly known as:  SENOKOT Take 1 tablet by mouth daily.   sertraline 100 MG tablet Commonly known as:  ZOLOFT TAKE 1 TABLET BY MOUTH ONCE DAILY   simvastatin 40 MG tablet Commonly known as:  ZOCOR Take 0.5 tablets (20 mg total) by mouth daily at 6 PM.   tamsulosin 0.4 MG Caps capsule Commonly known as:  FLOMAX Take 1 capsule (0.4 mg total) by mouth daily.   ULORIC 80 MG Tabs Generic drug:  Febuxostat Take 1 tablet by mouth every other day.   VENTOLIN HFA 108 (90 Base) MCG/ACT inhaler Generic drug:  albuterol INHALE TWO PUFFS EVERY 4-6 HOURS   albuterol (2.5 MG/3ML) 0.083% nebulizer solution Commonly known as:  PROVENTIL Take 3 mLs (2.5 mg total) by nebulization every 6 (six) hours as needed for wheezing or shortness of breath.   vitamin B-12 500 MCG tablet Commonly known as:  CYANOCOBALAMIN Take 1,000 mcg by mouth daily.   vitamin C 500 MG tablet Commonly known as:  ASCORBIC ACID Take 500 mg by mouth daily.   Vitamin D-3 5000 UNITS Tabs Take by mouth 2 (two) times daily.   VOLTAREN 1 % Gel Generic drug:  diclofenac sodium APPLY 2 GRAMS TO AFFECTED  AREA(S) 4 TIMES DAILY       Allergies:  Allergies  Allergen Reactions  . Levaquin [Levofloxacin In D5w] Other (See Comments)    Kidney shut down  . Milk-Related Compounds Nausea And Vomiting  . Septra Ds [Sulfamethoxazole-Trimethoprim]     Family History: Family History  Problem Relation Age of Onset  . Pancreatic cancer Father   . Cancer Father   . Stroke Father   . Heart disease Mother   . Diabetes Mother   . Depression Mother   . Kidney disease Neg Hx   . Prostate cancer Neg Hx     Social History:  reports that he has been smoking Cigarettes.  He has a 12.50 pack-year smoking history. He has never used smokeless tobacco. He reports that he does not drink alcohol or use drugs.  ROS: UROLOGY Frequent Urination?: Yes Hard to postpone urination?: Yes Burning/pain with urination?: No Get up at night to urinate?: Yes Leakage of urine?: No Urine stream starts and stops?: Yes Trouble starting stream?: No Do you have to strain to urinate?: No Blood in urine?: No Urinary tract infection?: No Sexually transmitted disease?: No Injury to kidneys or bladder?: No Painful intercourse?: No Weak stream?: Yes Erection problems?: No Penile pain?: No  Gastrointestinal Nausea?: No Vomiting?: No Indigestion/heartburn?: Yes Diarrhea?: No Constipation?: No  Constitutional Fever: No Night sweats?: Yes Weight loss?: No Fatigue?: Yes  Skin Skin rash/lesions?: No Itching?: Yes  Eyes Blurred vision?: Yes Double vision?: No  Ears/Nose/Throat Sore throat?: Yes Sinus problems?: Yes  Hematologic/Lymphatic Swollen glands?: No Easy bruising?: Yes  Cardiovascular Leg swelling?: Yes Chest pain?: No  Respiratory Cough?: Yes Shortness of breath?: Yes  Endocrine Excessive thirst?: Yes  Musculoskeletal Back pain?: Yes Joint pain?: Yes  Neurological Headaches?: Yes Dizziness?: No  Psychologic Depression?: Yes Anxiety?: Yes  Physical Exam: BP 128/74    Pulse 83   Ht 5\' 7"  (1.702 m)   Wt 200 lb (90.7 kg)   BMI 31.32 kg/m   Constitutional: Well nourished. Alert and oriented, No acute distress. HEENT: [place] AT, moist mucus membranes. Trachea midline, no masses. Cardiovascular: No clubbing, cyanosis, or edema. Respiratory: Normal respiratory effort, no increased work of breathing. GI: Abdomen is soft, non tender, non distended, no abdominal masses. Liver and spleen not palpable.  No hernias appreciated.  Stool sample for occult testing is not indicated.   GU: No CVA tenderness.  No bladder fullness or masses.  Patient with circumcised phallus. Urethral meatus is patent.  No penile discharge. No penile lesions or rashes. Scrotum without lesions, cysts, rashes and/or edema.  Testicles are located scrotally bilaterally. No masses are appreciated in the testicles. Left and right epididymis are normal. Rectal: Patient with  normal sphincter tone. Anus and perineum without scarring or rashes. No rectal masses are appreciated. Prostate is approximately 45 grams, irregular, no nodules are appreciated. Seminal vesicles are normal. Skin: No rashes, bruises or suspicious lesions. Lymph: No cervical or inguinal adenopathy. Neurologic: Grossly intact, no focal deficits, moving all 4 extremities. Psychiatric: Normal mood and affect.  Laboratory Data: Lab Results  Component Value Date   WBC 7.6 04/28/2016   HGB 16.7 04/28/2016   HCT 48.4 04/28/2016   MCV 85.9 04/28/2016   PLT 109 (L) 04/28/2016    Lab  Results  Component Value Date   CREATININE 1.32 (H) 02/08/2016     Lab Results  Component Value Date   TESTOSTERONE 318 (L) 11/19/2015    Lab Results  Component Value Date   HGBA1C 7.3 02/08/2016    Lab Results  Component Value Date   TSH 1.70 07/15/2011       Component Value Date/Time   CHOL 124 11/19/2015 0810   CHOL 92 07/15/2011 0528   HDL 38 (L) 11/19/2015 0810   HDL 20 (L) 07/15/2011 0528   CHOLHDL 3.3 11/19/2015 0810   VLDL  43 (H) 07/15/2011 0528   LDLCALC 51 11/19/2015 0810   LDLCALC 29 07/15/2011 0528    Lab Results  Component Value Date   AST 16 02/08/2016   Lab Results  Component Value Date   ALT 14 02/08/2016   Pertinent imaging Results for Mulligan, GARAN COHEN (MRN [id number]) as of 06/01/2016 12:13  Ref. Range 05/19/2016 11:29  Scan Result Unknown 98   Assessment & Plan:    1. Hypogonadism  - no cardiac clearance has been obtained  - patient is not interested in starting therapy as he would like a break from going to doctor visits    2. Erectile dysfunction  - SHIM score is 8.   - no nephrology clearance to take PDE5-inhibitors  3. BPH with LUTS  - IPSS score is 18/4, it is improving  - Continue conservative management, avoiding bladder irritants and timed voiding's (stop diet drinks and reduce coffee)  - Sleep study in the future  - Continue tamsulosin 0.4 mg daily  - RTC in 12 months for IPSS PSA and PVR  4. Suspicious skin lesion  - lesions fell off, most likely skin tags  Return in about 1 year (around 05/19/2017) for IPSS, PSA and exam.  These notes generated with voice recognition software. I apologize for typographical errors.  Zara Council, [facility] [address], [place], [place] [address] [phone]

## 2016-05-19 NOTE — Assessment & Plan Note (Signed)
Secondary to RA and DM neuropathy bilateral feet, chronic problem Switch Gabapentin to Lyrica and gradual dose titration, may help overall pain in addition to neuropathy

## 2016-05-19 NOTE — Assessment & Plan Note (Addendum)
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

## 2016-05-19 NOTE — Progress Notes (Signed)
Subjective:    Patient ID: Colton Carter., male    DOB: [birth year], 70 y.o.   MRN: [id number]  Colton Carter. is a 70 y.o. male presenting on 05/19/2016 for Diabetes (highest 180 and lowest 67 )   HPI  CHRONIC DM, Type 2 with complications. Reports he feels blood sugars are stable to improved. He has all hand written CBG logs for past 3 months. - Occasionally will have an evening snack sometimes and will raise his AM fasting sugar CBGs: Avg 110-120, Low 67, 73, High 184 (due to birthday cake). Checks CBGs 3x daily Meds: Lantus 65u BID (stable for about 1 year), Novolog SSI with meals (8 - 12 units based on sugar). Invokana 100mg  daily Reports good compliance. Tolerating well w/o side-effects Not on ACE/ARB. Previously on Lisinopril several years ago, stopped with hospitalization with acute renal failure, never re-started Lifestyle: Diet (follows DM diet) / Exercise - limited by DM neuropathy and both hip pain and arthritis (followed by Rheumatology, has history of RA, also note he is taking Prednisone 5mg  daily and Enbrel injection weekly) - Complication with DM neuropathy, nephropathy with CKD-III (prior significant history of sepsis and ARF >4 years ago) - Last ophthalmology DM eye exam 09/2015 without retinopathy Admits tingling and burning in feet Denies hypoglycemia, polyuria, visual changes  DM Neuropathy: - See above DM history - Reports concerns with gradual worsening bilateral feet burning pain and tingling over years. He admits to dx with DM neuropathy over past 20 years, currently on Gabapentin 600mg  QID. Admits he was previously on Lyrica only low dose but was not on this for long, and not sure why it was discontinued. Neuropathy pain worse with continued walking and standing on feet. - Interested to try Lyrica again - Followed by [facility] (Dr Cannon Kettle) for routine DM foot care with nail trimming, foot exams, requesting Diabetic shoes and will  discuss with them at next visit, current DM shoes from 01/2015 and he usually gets new shoes yearly  GAD / Anxiety with h/o Panic Attacks/ INSOMNIA / History of Depression - Reports chronic psych history with anxiety / depression over >15+ years, previously on Xanax 0.25mg  QID for >14 years, remains off of this - Recently over past 1 year he was switched to Zoloft 100mg  nightly with help sleeping and Buspar 7.5mg  BID, has not been on any other doses, thinks this isn't working as well for controlling his anxiety, has had some breakthrough mild panic attacks about 1-2x weekly, self limited. Describes anxiety with "mind jumping from different worries" including family, financial, other stressors - Had Sleep Study recently within past 1 month, was diagnosed with both severe OSA and severe central sleep apnea and some O2 desaturation, he is awaiting CPAP machine - Admits insomnia difficulty falling asleep and will wake up overnight to void, tries to limit fluids overnight  - Denies any thoughts of harming self or others, worsening depression, energy/fatigue, appetite  Health Maintenance: - Due for Flu shot and PNA vaccine today, PPSV23  Social History  Substance Use Topics  . Smoking status: Current Every Day Smoker    Packs/day: 0.25    Years: 50.00    Types: Cigarettes  . Smokeless tobacco: Never Used     Comment: 5 cigarettes a day  . Alcohol use No    Review of Systems Per HPI unless specifically indicated above  GAD 7 : Generalized Anxiety Score 05/19/2016 02/08/2016 01/01/2016 12/19/2015  Nervous, Anxious, on Edge  2 2 3 3   Control/stop worrying 2 2 2 3   Worry too much - different things 3 2 3 3   Trouble relaxing 2 3 3 3   Restless 1 2 3 3   Easily annoyed or irritable 1 3 3 3   Afraid - awful might happen 0 1 2 3   Total GAD 7 Score 11 15 19 21   Anxiety Difficulty Somewhat difficult - Very difficult Extremely difficult      Objective:    BP 138/74 (BP Location: Left Arm, Cuff Size:  Normal)   Pulse 60   Temp 98.1 F (36.7 C) (Oral)   Resp 16   Ht 5\' 7"  (1.702 m)   Wt 200 lb 9.6 oz (91 kg)   SpO2 98%   BMI 31.42 kg/m   Wt Readings from Last 3 Encounters:  05/19/16 200 lb 9.6 oz (91 kg)  04/28/16 203 lb 9.6 oz (92.4 kg)  03/28/16 196 lb (88.9 kg)    Physical Exam  Constitutional: He appears well-developed and well-nourished. No distress.  Well-appearing, comfortable, cooperative  HENT:  Head: Normocephalic and atraumatic.  Mouth/Throat: Oropharynx is clear and moist.  Cardiovascular: Normal rate, regular rhythm, normal heart sounds and intact distal pulses.   No murmur heard. Pulmonary/Chest: Effort normal and breath sounds normal. No respiratory distress. He has no wheezes. He has no rales.  Musculoskeletal: He exhibits no edema.  Neurological: He is alert.  Deferred DM foot exam today, last checked 01/2016  Skin: Skin is warm and dry. He is not diaphoretic.  Psychiatric: He has a normal mood and affect. His behavior is normal. Thought content normal.  Well groomed, good eye contact  Nursing note and vitals reviewed.   I have personally reviewed the following lab results from 04/28/16.  Results for orders placed or performed in visit on 04/28/16  CBC with Differential  Result Value Ref Range   WBC 7.6 3.8 - 10.6 K/uL   RBC 5.64 4.40 - 5.90 MIL/uL   Hemoglobin 16.7 13.0 - 18.0 g/dL   HCT 48.4 40.0 - 52.0 %   MCV 85.9 80.0 - 100.0 fL   MCH 29.6 26.0 - 34.0 pg   MCHC 34.4 32.0 - 36.0 g/dL   RDW 14.9 (H) 11.5 - 14.5 %   Platelets 109 (L) 150 - 440 K/uL   Neutrophils Relative % 53 %   Neutro Abs 4.0 1.4 - 6.5 K/uL   Lymphocytes Relative 30 %   Lymphs Abs 2.3 1.0 - 3.6 K/uL   Monocytes Relative 8 %   Monocytes Absolute 0.6 0.2 - 1.0 K/uL   Eosinophils Relative 8 %   Eosinophils Absolute 0.6 0 - 0.7 K/uL   Basophils Relative 1 %   Basophils Absolute 0.1 0 - 0.1 K/uL       Assessment & Plan:   Problem List Items Addressed This Visit     Essential hypertension    Mild elevated SBP, improved on manual re-check. Continues on Atenolol 25mg  daily, pulse is 60 today Not on ACE/ARB, previously dc'd Lisinopril by report years back with hospitalization for ARF, has since recovered now CKD-III No change today, advised patient to follow-up with Nephrology to discuss resuming ACE/ARB      Diabetic peripheral neuropathy ([place])    Chronic gradual worsening b/l DM neuropathy, previously controlled on gabapentin Followed by Podiatry ([place]) F/u with Podiatry for DM foot exams Continue alpha lipoic acid, capsaicin cream as needed Titrate off Gabapentin (x 2 days) and switch on Day  3 to Lyrica 50mg  TID (#90 +1refill) printed and given to patient, lowest dose and will likely need to titrate up at next visit, max dose 300mg  daily, follow-up 3-4 weeks      Relevant Medications   busPIRone (BUSPAR) 10 MG tablet   pregabalin (LYRICA) 50 MG capsule   Controlled diabetes mellitus type 2 with complications ([place]) - Primary    Well controlled DM on long-term insulin and invokana, with good CBG logs today Last A1c 7.3 ([id number]) Complications DM neuropathy, DM nephropathy Last ophtho DM eye normal w/o retinopathy 09/2015  Plan: Continue Lantus 65u BID, Novolog SSI 8-12u, Invokana 100mg  daily Check A1c today (lab draw), follow-up results, may need gradual titrate insulin Received PPSV23 Pneumovax, Flu shot today F/u with Podiatry for DM foot Switch Gabapentin to Lyrica for DM neuropathy, titrate Lyrica at follow-up      Relevant Orders   Hemoglobin A1c   CKD stage 3 due to type 2 diabetes mellitus (HCC)    Stable, CKD-III with GFR < 60 (last 01/2016) Followed by Nephrology Continue reduced dose Invokana 100mg  daily Not on ACE/ARB, previously dc'd Lisinopril by report years back with hospitalization for ARF, has since recovered now CKD-III No change today, advised patient to follow-up with Nephrology to discuss resuming  ACE/ARB      Chronic pain    Secondary to RA and DM neuropathy bilateral feet, chronic problem Switch Gabapentin to Lyrica and gradual dose titration, may help overall pain in addition to neuropathy      Relevant Medications   pregabalin (LYRICA) 50 MG capsule   Anxiety and depression    Improved anxiety, most consistent with GAD, also history of depressive symptoms, with insomnia. Prior chronic use of BDZ xanax >14 years, has been off >1 year now. However anxiety does not seem optimally controlled on current regimen. - Previously followed by Psychiatry Ellender Hose) - GAD7 improved from 21>19>15 > 11 today  Plan: 1. Gradual increase Buspar dose from 7.5mg  BID to 10mg  BID today 2. Continue Zoloft 100mg  daily, consider switch to alternative SSRI such as Celexa or Lexapro for better anxiety coverage in future if not controlled on Buspar 3. Awaiting CPAP for severe OSA, likely will improve anxiety/mood with improved sleep 4. Follow-up 4 weeks adjust Buspar      Relevant Medications   busPIRone (BUSPAR) 10 MG tablet    Other Visit Diagnoses    Need for pneumococcal vaccination       Relevant Orders   Pneumococcal polysaccharide vaccine 23-valent greater than or equal to 2yo subcutaneous/IM (Completed)   Needs flu shot       Relevant Orders   Flu vaccine HIGH DOSE PF (Fluzone High dose) (Completed)      Meds ordered this encounter  Medications  . busPIRone (BUSPAR) 10 MG tablet    Sig: Take 1 tablet (10 mg total) by mouth 2 (two) times daily.    Dispense:  60 tablet    Refill:  0  . pregabalin (LYRICA) 50 MG capsule    Sig: Take 1 capsule (50 mg total) by mouth 3 (three) times daily.    Dispense:  90 capsule    Refill:  1      Follow up plan: Return in about 4 weeks (around 06/16/2016) for Neuropathy, Anxiety.  Nobie Putnam, [facility] 05/19/2016, 11:08 AM

## 2016-05-19 NOTE — Assessment & Plan Note (Signed)
Improved anxiety, most consistent with GAD, also history of depressive symptoms, with insomnia. Prior chronic use of BDZ xanax >14 years, has been off >1 year now. However anxiety does not seem optimally controlled on current regimen. - Previously followed by Psychiatry Ellender Hose) - GAD7 improved from 21>19>15 > 11 today  Plan: 1. Gradual increase Buspar dose from 7.5mg  BID to 10mg  BID today 2. Continue Zoloft 100mg  daily, consider switch to alternative SSRI such as Celexa or Lexapro for better anxiety coverage in future if not controlled on Buspar 3. Awaiting CPAP for severe OSA, likely will improve anxiety/mood with improved sleep 4. Follow-up 4 weeks adjust Buspar

## 2016-05-19 NOTE — Assessment & Plan Note (Signed)
Well controlled DM on long-term insulin and invokana, with good CBG logs today Last A1c 7.3 ([id number]) Complications DM neuropathy, DM nephropathy Last ophtho DM eye normal w/o retinopathy 09/2015  Plan: Continue Lantus 65u BID, Novolog SSI 8-12u, Invokana 100mg  daily Check A1c today (lab draw), follow-up results, may need gradual titrate insulin Received PPSV23 Pneumovax, Flu shot today F/u with Podiatry for DM foot Switch Gabapentin to Lyrica for DM neuropathy, titrate Lyrica at follow-up

## 2016-05-19 NOTE — Assessment & Plan Note (Signed)
Mild elevated SBP, improved on manual re-check. Continues on Atenolol 25mg  daily, pulse is 60 today Not on ACE/ARB, previously dc'd Lisinopril by report years back with hospitalization for ARF, has since recovered now CKD-III No change today, advised patient to follow-up with Nephrology to discuss resuming ACE/ARB

## 2016-05-20 ENCOUNTER — Encounter: Payer: Self-pay | Admitting: Podiatry

## 2016-05-20 ENCOUNTER — Ambulatory Visit (INDEPENDENT_AMBULATORY_CARE_PROVIDER_SITE_OTHER): Payer: Medicare Other | Admitting: Podiatry

## 2016-05-20 VITALS — BP 133/73 | HR 66 | Resp 16

## 2016-05-20 DIAGNOSIS — M79609 Pain in unspecified limb: Secondary | ICD-10-CM | POA: Diagnosis not present

## 2016-05-20 DIAGNOSIS — L608 Other nail disorders: Secondary | ICD-10-CM

## 2016-05-20 DIAGNOSIS — L603 Nail dystrophy: Secondary | ICD-10-CM

## 2016-05-20 DIAGNOSIS — B351 Tinea unguium: Secondary | ICD-10-CM | POA: Diagnosis not present

## 2016-05-20 DIAGNOSIS — E0843 Diabetes mellitus due to underlying condition with diabetic autonomic (poly)neuropathy: Secondary | ICD-10-CM

## 2016-05-20 LAB — HEMOGLOBIN A1C
Hgb A1c MFr Bld: 7.2 % — ABNORMAL HIGH (ref <5.7)
Mean Plasma Glucose: 160 mg/dL

## 2016-05-20 NOTE — Progress Notes (Signed)
SUBJECTIVE Patient with a history of diabetes mellitus presents to office today complaining of elongated, thickened nails. Pain while ambulating in shoes. Patient is unable to trim their own nails.   Allergies  Allergen Reactions  . Levaquin [Levofloxacin In D5w] Other (See Comments)    Kidney shut down  . Milk-Related Compounds Nausea And Vomiting  . Septra Ds [Sulfamethoxazole-Trimethoprim]     OBJECTIVE General Patient is awake, alert, and oriented x 3 and in no acute distress. Derm Skin is dry and supple bilateral. Negative open lesions or macerations. Remaining integument unremarkable. Nails are tender, long, thickened and dystrophic with subungual debris, consistent with onychomycosis, 1-5 bilateral. No signs of infection noted. Vasc  DP and PT pedal pulses palpable bilaterally. Temperature gradient within normal limits.  Neuro Epicritic and protective threshold sensation diminished bilaterally.  Musculoskeletal Exam No symptomatic pedal deformities noted bilateral. Muscular strength within normal limits.  ASSESSMENT 1. Diabetes Mellitus w/ peripheral neuropathy 2. Onychomycosis of nail due to dermatophyte bilateral 3. Pain in foot bilateral  PLAN OF CARE 1. Patient evaluated today. 2. Instructed to maintain good pedal hygiene and foot care. Stressed importance of controlling blood sugar.  3. Mechanical debridement of nails 1-5 bilaterally performed using a nail nipper. Filed with dremel without incident.  4. Follow-up paperwork for authorization for diabetic shoes was initiated today 5. Return to clinic in 3 mos.    Edrick Kins, DPM

## 2016-05-21 ENCOUNTER — Ambulatory Visit (INDEPENDENT_AMBULATORY_CARE_PROVIDER_SITE_OTHER): Payer: Medicare Other

## 2016-05-21 ENCOUNTER — Encounter (INDEPENDENT_AMBULATORY_CARE_PROVIDER_SITE_OTHER): Payer: Self-pay | Admitting: Vascular Surgery

## 2016-05-21 ENCOUNTER — Ambulatory Visit (INDEPENDENT_AMBULATORY_CARE_PROVIDER_SITE_OTHER): Payer: Medicare Other | Admitting: Vascular Surgery

## 2016-05-21 VITALS — BP 134/82 | HR 59 | Resp 17 | Ht 67.0 in | Wt 201.0 lb

## 2016-05-21 DIAGNOSIS — I708 Atherosclerosis of other arteries: Secondary | ICD-10-CM

## 2016-05-21 DIAGNOSIS — I739 Peripheral vascular disease, unspecified: Secondary | ICD-10-CM | POA: Diagnosis not present

## 2016-05-21 DIAGNOSIS — M79604 Pain in right leg: Secondary | ICD-10-CM

## 2016-05-21 DIAGNOSIS — Z794 Long term (current) use of insulin: Secondary | ICD-10-CM | POA: Diagnosis not present

## 2016-05-21 DIAGNOSIS — E118 Type 2 diabetes mellitus with unspecified complications: Secondary | ICD-10-CM

## 2016-05-21 DIAGNOSIS — E785 Hyperlipidemia, unspecified: Secondary | ICD-10-CM

## 2016-05-21 DIAGNOSIS — M79605 Pain in left leg: Secondary | ICD-10-CM | POA: Diagnosis not present

## 2016-05-21 DIAGNOSIS — I700 Atherosclerosis of aorta: Secondary | ICD-10-CM | POA: Diagnosis not present

## 2016-05-21 NOTE — Progress Notes (Signed)
Subjective:    Patient ID: Colton Carter., male    DOB: 07-Sep-1945, 70 y.o.   MRN: [id number] Chief Complaint  Patient presents with  . Re-evaluation    Ultrasound follow up   Patient presents for a six month lower extremity atherosclerotic disease follow up. The patient is s/p bilateral iliac stents in 2016. The patient underwent an ABI which showed Right ABI: 1.06 and Left 1.08 (on 11/08/15, Right ABI: 1.20 and Left 1.05). Toe pressures and waveforms within normal limits. A aorto-iliac arterial duplex is notable for patent bilateral stents. The patient denies any claudication like symptoms, rest pain or ulcers to his feet / toes.    Review of Systems  Constitutional: Negative.   HENT: Negative.   Eyes: Negative.   Respiratory: Negative.   Cardiovascular: Negative.   Gastrointestinal: Negative.   Endocrine: Negative.   Genitourinary: Negative.   Musculoskeletal: Negative.   Skin: Negative.   Allergic/Immunologic: Negative.   Neurological: Negative.   Hematological: Negative.   Psychiatric/Behavioral: Negative.       Objective:   Physical Exam  Constitutional: He is oriented to person, place, and time. He appears well-developed and well-nourished.  HENT:  Head: Normocephalic and atraumatic.  Eyes: EOM are normal. Pupils are equal, round, and reactive to light.  Neck: Normal range of motion.  Cardiovascular: Normal rate, regular rhythm, normal heart sounds and intact distal pulses.   Pulses:      Radial pulses are 2+ on the right side, and 2+ on the left side.       Dorsalis pedis pulses are 2+ on the right side, and 2+ on the left side.       Posterior tibial pulses are 2+ on the right side, and 2+ on the left side.  Pulmonary/Chest: Effort normal and breath sounds normal.  Abdominal: Soft. Bowel sounds are normal.  Musculoskeletal: Normal range of motion. He exhibits no edema.  Neurological: He is alert and oriented to person, place, and time.  Skin: Skin is warm and  dry.  Psychiatric: He has a normal mood and affect. His behavior is normal. Judgment and thought content normal.   BP 134/82 (BP Location: Left Arm)   Pulse (!) 59   Resp 17   Ht 5\' 7"  (1.702 m)   Wt 201 lb (91.2 kg)   BMI 31.48 kg/m   Past Medical History:  Diagnosis Date  . Allergy   . Arthritis   . BPH (benign prostatic hyperplasia)   . Chronic neck pain   . Chronic prostatitis   . CKD (chronic kidney disease)   . Collagen vascular disease (HCC)    RA  . COPD (chronic obstructive pulmonary disease) ([place])   . Coronary artery disease   . Diabetes mellitus   . Diverticulosis   . ED (erectile dysfunction)   . Emphysema of lung ([place])   . Fatty liver   . GERD (gastroesophageal reflux disease)   . Headache   . High cholesterol   . Hypertension   . Hypogonadism in male   . Obesity   . Pancreatitis   . Peripheral vascular disease ([place])   . Personal history of tobacco use, presenting hazards to health 11/16/2015  . Phimosis   . Polycythemia, secondary 01/10/2015  . Renal disorder   . Skin cancer 2014   nose  . Sleep apnea   . Stomach ulcer    in past  . Urinary frequency   . Urinary urgency    Social History  Social History  . Marital status: Widowed    Spouse name: N/A  . Number of children: N/A  . Years of education: N/A   Occupational History  . 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 Smoking status: Current Every Day Smoker    Packs/day: 0.25    Years: 50.00    Types: Cigarettes  . Smokeless tobacco: Never Used     Comment: 5 cigarettes a day  . Alcohol use No  . Drug use: No  . Sexual activity: Not on file   Other Topics Concern  . Not on file   Social History Narrative  . No narrative on file   Past Surgical History:  Procedure Laterality Date  . APPENDECTOMY  1980  . CERVICAL FUSION    . CHOLECYSTECTOMY  2006  . COLONOSCOPY  2011   Dr [name]  . COLONOSCOPY WITH PROPOFOL N/A 07/11/2015   Procedure:  COLONOSCOPY WITH PROPOFOL;  Surgeon: Christene Lye, MD;  Location: [facility] ENDOSCOPY;  Service: Endoscopy;  Laterality: N/A;  . ESOPHAGOGASTRODUODENOSCOPY (EGD) WITH PROPOFOL N/A 07/11/2015   Procedure: ESOPHAGOGASTRODUODENOSCOPY (EGD) WITH PROPOFOL;  Surgeon: Christene Lye, MD;  Location: [facility] ENDOSCOPY;  Service: Endoscopy;  Laterality: N/A;  . PERIPHERAL VASCULAR CATHETERIZATION N/A 02/06/2015   Procedure: Abdominal Aortogram w/Lower Extremity;  Surgeon: Katha Cabal, MD;  Location: [place] [facility];  Service: Cardiovascular;  Laterality: N/A;  . PERIPHERAL VASCULAR CATHETERIZATION  02/06/2015   Procedure: Lower Extremity Intervention;  Surgeon: Katha Cabal, MD;  Location: [place] [facility];  Service: Cardiovascular;;  . PERIPHERAL VASCULAR CATHETERIZATION Left 03/20/2015   Procedure: Lower Extremity Angiography;  Surgeon: Katha Cabal, MD;  Location: [place] [facility];  Service: Cardiovascular;  Laterality: Left;  . PERIPHERAL VASCULAR CATHETERIZATION  03/20/2015   Procedure: Lower Extremity Intervention;  Surgeon: Katha Cabal, MD;  Location: [place] [facility];  Service: Cardiovascular;;  . SKIN CANCER EXCISION  2014  . UPPER GI ENDOSCOPY     Family History  Problem Relation Age of Onset  . Pancreatic cancer Father   . Cancer Father   . Stroke Father   . Heart disease Mother   . Diabetes Mother   . Depression Mother   . Kidney disease Neg Hx   . Prostate cancer Neg Hx    Allergies  Allergen Reactions  . Levaquin [Levofloxacin In D5w] Other (See Comments)    Kidney shut down  . Milk-Related Compounds Nausea And Vomiting  . Septra Ds [Sulfamethoxazole-Trimethoprim]       Assessment & Plan:  Patient presents for a six month lower extremity atherosclerotic disease follow up. The patient is s/p bilateral iliac stents in 2016. The patient underwent an ABI which showed Right ABI: 1.06 and Left 1.08 (on 11/08/15, Right ABI: 1.20 and Left 1.05).  Toe pressures and waveforms within normal limits. A aorto-iliac arterial duplex is notable for patent bilateral stents. The patient denies any claudication like symptoms, rest pain or ulcers to his feet / toes.   1. PVD (peripheral vascular disease) ([place]) - Stable Studies reviewed with patient. Asymptomatic, duplex and physical exam stable. No intervention indicated at this time.  Patient to follow up in one year with ABI and Aorto-iliac Duplex. Patient to continue medical optimization with dyslipidemia medication. Patient to remain abstinent of tobacco use. I have discussed with the patient at length the risk factors for and pathogenesis of atherosclerotic disease and encouraged a healthy diet, regular  exercise regimen and blood pressure / glucose control.  The patient was encouraged to call the office in the interim if he experiences any claudication like symptoms, rest pain or ulcers to his feet / toes.  2. Controlled type 2 diabetes mellitus with complication, with long-term current use of insulin (HCC) - Stable Encouraged good control as its slows the progression of atherosclerotic disease  3. Hyperlipidemia, unspecified hyperlipidemia type - Stable On statin. Encouraged good control as its slows the progression of atherosclerotic disease  Current Outpatient Prescriptions on File Prior to Visit  Medication Sig Dispense Refill  . ADVAIR DISKUS 250-50 MCG/DOSE AEPB INHALE ONE PUFF EVERY 12 HOURS 60 each 11  . albuterol (PROVENTIL) (2.5 MG/3ML) 0.083% nebulizer solution Take 3 mLs (2.5 mg total) by nebulization every 6 (six) hours as needed for wheezing or shortness of breath. 150 mL 1  . Alpha-Lipoic Acid 600 MG CAPS Take 1 capsule (600 mg total) by mouth daily. 30 each 11  . atenolol (TENORMIN) 25 MG tablet Take 1 tablet (25 mg total) by mouth daily. 90 tablet 3  . baclofen (LIORESAL) 10 MG tablet TAKE 1 TABLET BY MOUTH EVERY 8 HOURS 90 each 3  . busPIRone (BUSPAR) 10 MG tablet Take 1  tablet (10 mg total) by mouth 2 (two) times daily. (Patient taking differently: Take 15 mg by mouth 2 (two) times daily. ) 60 tablet 0  . canagliflozin (INVOKANA) 100 MG TABS tablet Take 1 tablet (100 mg total) by mouth daily before breakfast. 90 tablet 3  . capsaicin (ZOSTRIX) 0.025 % cream Apply topically 2 (two) times daily. To feet. 60 g 11  . Cholecalciferol (VITAMIN D-3) 5000 UNITS TABS Take by mouth 2 (two) times daily.    Scarlette Shorts SURECLICK 50 MG/ML injection     . furosemide (LASIX) 40 MG tablet TAKE ONE (1) TABLET BY MOUTH EVERY DAY 30 tablet 11  . HYDROcodone-acetaminophen (NORCO) 10-325 MG tablet Take 1 tablet by mouth every 6 (six) hours as needed.    . insulin aspart (NOVOLOG) 100 UNIT/ML injection Inject into skin per Sliding scale three times a day at mealtimes 10 mL 11  . Insulin Pen Needle (B-D ULTRAFINE III SHORT PEN) 31G X 8 MM MISC USE ONE EACH 5 TIMES PER DAY WITH INSULIN. 100 each 11  . ketoconazole (NIZORAL) 2 % cream Apply 1 application topically daily. 60 g 2  . LANTUS SOLOSTAR 100 UNIT/ML Solostar Pen INJECT 65 UNITS SUBCUTANEOUSLY TWICE DAILY 45 mL 3  . levocetirizine ([name]) 5 MG tablet Take 1 tablet (5 mg total) by mouth every evening. 30 tablet 2  . montelukast (SINGULAIR) 10 MG tablet Take 1 tablet (10 mg total) by mouth at bedtime. 90 tablet 3  . niacin 500 MG tablet Take 500 mg by mouth at bedtime.    Marland Kitchen NOVOLOG FLEXPEN 100 UNIT/ML FlexPen USE AS DIRECTED PER SLIDING SCALE 100-150-5 UNITS, 151-200-7 UNITS, 201- 250-9 UNITS, 251-300-11 UNITS, 301-350- 13 UNITS, 351-400-15 UNITS 15 mL 11  . Omega-3 Fatty Acids (FISH OIL) 1000 MG CAPS Take by mouth 2 (two) times daily. Reported on 10/26/2015    . omeprazole (PRILOSEC) 20 MG capsule Take 1 capsule (20 mg total) by mouth 2 (two) times daily before a meal. 60 capsule 11  . polyethylene glycol powder (GLYCOLAX/MIRALAX) powder 255 grams one bottle for colonoscopy prep 255 g 0  . predniSONE (DELTASONE) 5 MG tablet     .  pregabalin (LYRICA) 50 MG capsule Take 1 capsule (50 mg total)  by mouth 3 (three) times daily. 90 capsule 1  . senna (SENOKOT) 8.6 MG tablet Take 1 tablet by mouth daily.    . sertraline (ZOLOFT) 100 MG tablet TAKE 1 TABLET BY MOUTH ONCE DAILY 90 tablet 3  . simvastatin (ZOCOR) 40 MG tablet Take 0.5 tablets (20 mg total) by mouth daily at 6 PM. 90 tablet 3  . tamsulosin (FLOMAX) 0.4 MG CAPS capsule Take 1 capsule (0.4 mg total) by mouth daily. 90 capsule 3  . ULORIC 80 MG TABS Take 1 tablet by mouth every other day.    . VENTOLIN HFA 108 (90 Base) MCG/ACT inhaler INHALE TWO PUFFS EVERY 4-6 HOURS 18 g 3  . vitamin B-12 (CYANOCOBALAMIN) 500 MCG tablet Take 1,000 mcg by mouth daily.    . vitamin C (ASCORBIC ACID) 500 MG tablet Take 500 mg by mouth daily.    . VOLTAREN 1 % GEL APPLY 2 GRAMS TO AFFECTED AREA(S) 4 TIMES DAILY 100 g 0  . polyethylene glycol powder (GLYCOLAX/MIRALAX) powder MIX 17GM (= 1 CAPFUL) IN LIQUID AND TAKEONE TIME DAILY AS NEEDED (Patient not taking: Reported on 05/21/2016) 527 g 5   Current Facility-Administered Medications on File Prior to Visit  Medication Dose Route Frequency Provider Last Rate Last Dose  . ipratropium-albuterol (DUONEB) 0.5-2.5 (3) MG/3ML nebulizer solution 3 mL  3 mL Nebulization Q6H Amy Lauren Krebs, NP        There are no Patient Instructions on file for this visit. No Follow-up on file.   Cyndia Degraff A Christabel Camire, PA-C

## 2016-06-20 ENCOUNTER — Ambulatory Visit: Payer: Medicare Other | Admitting: Family Medicine

## 2016-06-30 ENCOUNTER — Ambulatory Visit (INDEPENDENT_AMBULATORY_CARE_PROVIDER_SITE_OTHER): Payer: Medicare Other | Admitting: Family Medicine

## 2016-06-30 ENCOUNTER — Inpatient Hospital Stay: Payer: Medicare Other

## 2016-06-30 ENCOUNTER — Encounter: Payer: Self-pay | Admitting: Family Medicine

## 2016-06-30 ENCOUNTER — Inpatient Hospital Stay: Payer: Medicare Other | Attending: Internal Medicine

## 2016-06-30 DIAGNOSIS — E1142 Type 2 diabetes mellitus with diabetic polyneuropathy: Secondary | ICD-10-CM

## 2016-06-30 DIAGNOSIS — F418 Other specified anxiety disorders: Secondary | ICD-10-CM

## 2016-06-30 DIAGNOSIS — F329 Major depressive disorder, single episode, unspecified: Secondary | ICD-10-CM

## 2016-06-30 DIAGNOSIS — F419 Anxiety disorder, unspecified: Principal | ICD-10-CM

## 2016-06-30 DIAGNOSIS — D696 Thrombocytopenia, unspecified: Secondary | ICD-10-CM | POA: Insufficient documentation

## 2016-06-30 DIAGNOSIS — G8929 Other chronic pain: Secondary | ICD-10-CM

## 2016-06-30 DIAGNOSIS — D751 Secondary polycythemia: Secondary | ICD-10-CM

## 2016-06-30 LAB — HEMATOCRIT: HEMATOCRIT: 44.6 % (ref 40.0–52.0)

## 2016-06-30 LAB — HEMOGLOBIN: Hemoglobin: 14.9 g/dL (ref 13.0–18.0)

## 2016-06-30 MED ORDER — PREGABALIN 50 MG PO CAPS: 50.0000 mg | ORAL_CAPSULE | Freq: Three times a day (TID) | ORAL | 1 refills | Status: AC

## 2016-06-30 MED ORDER — BUSPIRONE HCL 10 MG PO TABS: 10.0000 mg | ORAL_TABLET | Freq: Two times a day (BID) | ORAL | 2 refills | Status: AC

## 2016-06-30 NOTE — Assessment & Plan Note (Addendum)
Improved pain and symptoms on Lyrica 50 TID (switched from Gabapentin 600 TID) Followed by Podiatry  Plan: 1. Refill Lyrica 50mg  TID #90 +1 refill, until can get established at new PCP, may need to titrate up in future as needed, but ideally continue this dose for as long as controlling symptoms

## 2016-06-30 NOTE — Assessment & Plan Note (Signed)
Continued improved anxiety, with increase of buspar and now upcoming move away from several life stressors. Seems improved sleep, mood and overall function with this less stress.  Plan: 1. Refill Buspar 10mg  BID with refills to last until establish with new PCP 2. Continue Zoloft 100mg  daily, still consider switch to alternative SSRI in future with Celexa / Lexapro if needed for more anxiety control if need other adjustment 3. Suspect CPAP will help sleep and improve daily function / mood/anxiety

## 2016-06-30 NOTE — Patient Instructions (Signed)
Thank you for coming in to clinic today.  BP was slightly elevated improved on re-check, please ask your Kidney Doctor about possibility of restarting Lisinopril or an ACE / ARB medication in future, now that your kidneys have improved  Refilled Buspar, for 3 month supply  Refilled Lyrica 90 pills (1 month supply) + 1 additional refill  Follow-up with new PCP  If you have any other questions or concerns, please feel free to call the clinic or send a message through [place]. You may also schedule an earlier appointment if necessary.  Nobie Putnam, DO [facility]

## 2016-06-30 NOTE — Assessment & Plan Note (Signed)
Secondary to RA and DM neuropathy bilateral feet, chronic problem Improved on Lyrica, continue with refill today

## 2016-06-30 NOTE — Progress Notes (Signed)
Subjective:    Patient ID: Colton Carter., male    DOB: Feb 26, 1946, 70 y.o.   MRN: [id number]  Colton Carter. is a 70 y.o. male presenting on 06/30/2016 for Anxiety   HPI   GAD / Anxiety with h/o Panic Attacks / INSOMNIA / History of Depression - Last visit 05/19/16, he was increased on Buspar from 7.5mg  BID to 10mg  BID, continues to take Zoloft 100mg  daily - Today he reports that he is moving to [facility] (near [place]) to a location he has had for past >30 years and has recently fixed up. He is primarily moving to get away from several stressors locally, and he already states that he feels much better about this move and has less symptoms when he stays there. He is moving within weeks, and has already established new PCP at new location in 07/2016. - Admits he is improved on increased Buspar, tolerating well, and also admits improved sleep - Request refill on Buspar today - Will have pharmacy send any refills needed, only temporary until new provider can continue - Denies any thoughts of harming self or others, worsening depression, energy/fatigue, appetite  Chronic Pain with DM Neuropathy: - Chronic PMH of 123456, complicated by DM neuropathy >20 years - Reports improvement in neuropathy since switched from Gabapentin 600 QID to Lyrica 50mg  TID last visit 05/19/16. Tolerating lyrica well. He wants to continue at this dose for now, improved function able to walk more but still persistent chronic pain - Request refill for Lyrica today - Followed by [facility] tingling and burning in feet Denies hypoglycemia, polyuria, visual changes   Social History  Substance Use Topics  . Smoking status: Current Every Day Smoker    Packs/day: 0.25    Years: 50.00    Types: Cigarettes  . Smokeless tobacco: Never Used     Comment: 5 cigarettes a day  . Alcohol use No    Review of Systems Per HPI unless specifically indicated above  GAD 7 : Generalized Anxiety  Score 06/30/2016 05/19/2016 02/08/2016 01/01/2016  Nervous, Anxious, on Edge 2 2 2 3   Control/stop worrying 1 2 2 2   Worry too much - different things 2 3 2 3   Trouble relaxing 1 2 3 3   Restless 2 1 2 3   Easily annoyed or irritable 1 1 3 3   Afraid - awful might happen 1 0 1 2  Total GAD 7 Score 10 11 15 19   Anxiety Difficulty Somewhat difficult Somewhat difficult - Very difficult      Objective:    BP 134/68 (BP Location: Left Arm)   Pulse 71   Temp 98 F (36.7 C) (Oral)   Resp 16   Ht 5\' 7"  (1.702 m)   Wt 202 lb (91.6 kg)   BMI 31.64 kg/m   Wt Readings from Last 3 Encounters:  06/30/16 202 lb (91.6 kg)  05/21/16 201 lb (91.2 kg)  05/19/16 200 lb (90.7 kg)    Physical Exam  Constitutional: He appears well-developed and well-nourished. No distress.  Well-appearing, comfortable, cooperative  HENT:  Head: Normocephalic and atraumatic.  Mouth/Throat: Oropharynx is clear and moist.  Cardiovascular: Normal rate, regular rhythm, normal heart sounds and intact distal pulses.   No murmur heard. Pulmonary/Chest: Effort normal and breath sounds normal. No respiratory distress. He has no wheezes. He has no rales.  Musculoskeletal: He exhibits no edema.  Neurological: He is alert.  Skin: Skin is warm and dry. He  is not diaphoretic.  Psychiatric: He has a normal mood and affect. His behavior is normal. Thought content normal.  Well groomed, good eye contact  Nursing note and vitals reviewed.      Assessment & Plan:   Problem List Items Addressed This Visit    Diabetic peripheral neuropathy (HCC)    Improved pain and symptoms on Lyrica 50 TID (switched from Gabapentin 600 TID) Followed by Podiatry  Plan: 1. Refill Lyrica 50mg  TID #90 +1 refill, until can get established at new PCP, may need to titrate up in future as needed, but ideally continue this dose for as long as controlling symptoms      Relevant Medications   busPIRone (BUSPAR) 10 MG tablet   pregabalin (LYRICA) 50 MG  capsule   Chronic pain    Secondary to RA and DM neuropathy bilateral feet, chronic problem Improved on Lyrica, continue with refill today      Relevant Medications   pregabalin (LYRICA) 50 MG capsule   Anxiety and depression    Continued improved anxiety, with increase of buspar and now upcoming move away from several life stressors. Seems improved sleep, mood and overall function with this less stress.  Plan: 1. Refill Buspar 10mg  BID with refills to last until establish with new PCP 2. Continue Zoloft 100mg  daily, still consider switch to alternative SSRI in future with Celexa / Lexapro if needed for more anxiety control if need other adjustment 3. Suspect CPAP will help sleep and improve daily function / mood/anxiety      Relevant Medications   busPIRone (BUSPAR) 10 MG tablet      Meds ordered this encounter  Medications  .   . busPIRone (BUSPAR) 10 MG tablet    Sig: Take 1 tablet (10 mg total) by mouth 2 (two) times daily.    Dispense:  60 tablet    Refill:  2  . pregabalin (LYRICA) 50 MG capsule    Sig: Take 1 capsule (50 mg total) by mouth 3 (three) times daily.    Dispense:  90 capsule    Refill:  1      Follow up plan: Return if symptoms worsen or fail to improve.  He is already scheduled for follow-up with new PCP near [place] within 1 month. Anticipates he will no longer be established with our [facility] practice.  Nobie Putnam, [facility] 06/30/2016, 10:21 AM

## 2016-07-09 ENCOUNTER — Other Ambulatory Visit (INDEPENDENT_AMBULATORY_CARE_PROVIDER_SITE_OTHER): Payer: Self-pay | Admitting: Vascular Surgery

## 2016-07-17 ENCOUNTER — Other Ambulatory Visit (INDEPENDENT_AMBULATORY_CARE_PROVIDER_SITE_OTHER): Payer: Self-pay | Admitting: Vascular Surgery

## 2016-07-28 ENCOUNTER — Other Ambulatory Visit: Payer: Self-pay | Admitting: Internal Medicine

## 2016-08-13 ENCOUNTER — Encounter: Payer: Self-pay | Admitting: Family Medicine

## 2016-08-13 DIAGNOSIS — E1142 Type 2 diabetes mellitus with diabetic polyneuropathy: Secondary | ICD-10-CM

## 2016-08-13 DIAGNOSIS — K5909 Other constipation: Secondary | ICD-10-CM

## 2016-08-13 DIAGNOSIS — Z794 Long term (current) use of insulin: Secondary | ICD-10-CM

## 2016-08-13 DIAGNOSIS — G8929 Other chronic pain: Secondary | ICD-10-CM

## 2016-08-13 DIAGNOSIS — M542 Cervicalgia: Secondary | ICD-10-CM

## 2016-08-13 MED ORDER — VOLTAREN 1 % TD GEL: TRANSDERMAL | 0 refills | Status: AC

## 2016-08-13 MED ORDER — CANAGLIFLOZIN 100 MG PO TABS: 100.0000 mg | ORAL_TABLET | Freq: Every day | ORAL | 0 refills | Status: AC

## 2016-08-13 MED ORDER — INSULIN ASPART 100 UNIT/ML FLEXPEN: PEN_INJECTOR | SUBCUTANEOUS | 2 refills | Status: AC

## 2016-08-13 MED ORDER — CAPSAICIN 0.025 % EX CREA: TOPICAL_CREAM | Freq: Two times a day (BID) | CUTANEOUS | 0 refills | Status: AC

## 2016-08-13 MED ORDER — POLYETHYLENE GLYCOL 3350 17 GM/SCOOP PO POWD: 17.0000 g | Freq: Every day | ORAL | 0 refills | Status: AC | PRN

## 2016-08-13 MED ORDER — INSULIN GLARGINE 100 UNIT/ML SOLOSTAR PEN: PEN_INJECTOR | SUBCUTANEOUS | 2 refills | Status: AC

## 2016-08-13 MED ORDER — INSULIN ASPART 100 UNIT/ML ~~LOC~~ SOLN: SUBCUTANEOUS | 2 refills | Status: AC

## 2016-08-13 MED ORDER — ALBUTEROL SULFATE HFA 108 (90 BASE) MCG/ACT IN AERS
INHALATION_SPRAY | RESPIRATORY_TRACT | 0 refills | Status: AC
Start: 2016-08-13 — End: ?

## 2016-08-13 MED ORDER — BACLOFEN 10 MG PO TABS: 10.0000 mg | ORAL_TABLET | Freq: Three times a day (TID) | ORAL | 0 refills | Status: AC

## 2016-08-14 ENCOUNTER — Encounter: Payer: Self-pay | Admitting: Family Medicine

## 2016-08-14 DIAGNOSIS — Z794 Long term (current) use of insulin: Principal | ICD-10-CM

## 2016-08-14 DIAGNOSIS — E119 Type 2 diabetes mellitus without complications: Secondary | ICD-10-CM

## 2016-08-14 MED ORDER — INSULIN PEN NEEDLE 31G X 8 MM MISC: 11 refills | Status: AC

## 2016-08-21 ENCOUNTER — Ambulatory Visit: Payer: Medicare Other | Admitting: Podiatry

## 2016-09-01 ENCOUNTER — Inpatient Hospital Stay: Payer: Medicare Other | Attending: Internal Medicine

## 2016-09-01 ENCOUNTER — Inpatient Hospital Stay: Payer: Medicare Other

## 2016-10-30 ENCOUNTER — Encounter: Payer: Self-pay | Admitting: Internal Medicine

## 2016-10-30 ENCOUNTER — Other Ambulatory Visit: Payer: Medicare Other

## 2016-10-30 ENCOUNTER — Ambulatory Visit: Payer: Medicare Other | Admitting: Internal Medicine

## 2016-11-03 ENCOUNTER — Inpatient Hospital Stay: Payer: Medicare Other

## 2016-11-03 ENCOUNTER — Inpatient Hospital Stay: Payer: Medicare Other | Admitting: Internal Medicine

## 2016-11-10 ENCOUNTER — Other Ambulatory Visit: Payer: Self-pay

## 2016-11-10 MED ORDER — FUROSEMIDE 40 MG PO TABS: ORAL_TABLET | ORAL | 11 refills | Status: AC

## 2016-11-10 NOTE — Telephone Encounter (Signed)
Last ov 06/30/16 Last filled 11/01/15

## 2016-12-30 ENCOUNTER — Other Ambulatory Visit: Payer: Self-pay

## 2017-02-17 ENCOUNTER — Other Ambulatory Visit: Payer: Self-pay | Admitting: Hematology

## 2017-02-17 DIAGNOSIS — R918 Other nonspecific abnormal finding of lung field: Secondary | ICD-10-CM

## 2017-05-18 NOTE — Progress Notes (Deleted)
05/19/2017 1:04 PM   Colton Carter. 1945/10/25 [id number]  Referring provider: [name], NP No address on file  No chief complaint on file.   HPI: Patient is a 71 year old Caucasian male who presents today for a 12 month follow up for his BPH with LUTS.  BPH WITH LUTS His IPSS score today is ***, which is *** lower urinary tract symptomatology. He is *** with his quality life due to his urinary symptoms. His PVR is *** mL.  His previous PVR is 67 mL.   His previous IPSS was 18/4.  His major complaint today frequency, urgency, nocturia x 3, incontinence, intermittency and weak urinary stream.  He has had these symptoms for ***.   He denies any dysuria, hematuria or suprapubic pain.   He has had a biopsy of his prostate in 2007 due to a abnormal DRE.  The results were benign.  He also denies any recent fevers, chills, nausea or vomiting.  He does not have a family history of PCa.  He is drinking diet drinks, coffee and water.      Score:  1-7 Mild 8-19 Moderate 20-35 Severe  PMH: Past Medical History:  Diagnosis Date  . Allergy   . Arthritis   . BPH (benign prostatic hyperplasia)   . Chronic neck pain   . Chronic prostatitis   . CKD (chronic kidney disease)   . Collagen vascular disease (HCC)    RA  . COPD (chronic obstructive pulmonary disease) ([place])   . Coronary artery disease   . Diabetes mellitus   . Diverticulosis   . ED (erectile dysfunction)   . Emphysema of lung ([place])   . Fatty liver   . GERD (gastroesophageal reflux disease)   . Headache   . High cholesterol   . Hypertension   . Hypogonadism in male   . Obesity   . Pancreatitis   . Peripheral vascular disease ([place])   . Personal history of tobacco use, presenting hazards to health 11/16/2015  . Phimosis   . Polycythemia, secondary 01/10/2015  . Renal disorder   . Skin cancer 2014   nose  . Sleep apnea   . Stomach ulcer    in past  . Urinary frequency   . Urinary urgency     Surgical  History: Past Surgical History:  Procedure Laterality Date  . APPENDECTOMY  1980  . CERVICAL FUSION    . CHOLECYSTECTOMY  2006  . COLONOSCOPY  2011   Dr [name]  . SKIN CANCER EXCISION  2014  . UPPER GI ENDOSCOPY      Home Medications:  Allergies as of 05/19/2017      Reactions   Levaquin [levofloxacin In D5w] Other (See Comments)   Kidney shut down   Milk-related Compounds Nausea And Vomiting   Septra Ds [sulfamethoxazole-trimethoprim]       Medication List        Accurate as of 05/18/17  1:04 PM. Always use your most recent med list.          ADVAIR DISKUS 250-50 MCG/DOSE Aepb Generic drug:  Fluticasone-Salmeterol INHALE ONE PUFF EVERY 12 HOURS   albuterol (2.5 MG/3ML) 0.083% nebulizer solution Commonly known as:  PROVENTIL Take 3 mLs (2.5 mg total) by nebulization every 6 (six) hours as needed for wheezing or shortness of breath.   albuterol 108 (90 Base) MCG/ACT inhaler Commonly known as:  VENTOLIN HFA INHALE TWO PUFFS EVERY 4-6 HOURS   Alpha-Lipoic Acid 600 MG Caps Take  1 capsule (600 mg total) by mouth daily.   atenolol 25 MG tablet Commonly known as:  TENORMIN Take 1 tablet (25 mg total) by mouth daily.   baclofen 10 MG tablet Commonly known as:  LIORESAL Take 1 tablet (10 mg total) by mouth every 8 (eight) hours.   busPIRone 10 MG tablet Commonly known as:  BUSPAR Take 1 tablet (10 mg total) by mouth 2 (two) times daily.   canagliflozin 100 MG Tabs tablet Commonly known as:  INVOKANA Take 1 tablet (100 mg total) by mouth daily before breakfast.   capsaicin 0.025 % cream Commonly known as:  ZOSTRIX Apply topically 2 (two) times daily. To feet.   ENBREL SURECLICK 50 MG/ML injection Generic drug:  etanercept   Fish Oil 1000 MG Caps Take by mouth 2 (two) times daily. Reported on 10/26/2015   furosemide 40 MG tablet Commonly known as:  LASIX TAKE ONE (1) TABLET BY MOUTH EVERY DAY   gabapentin 600 MG tablet Commonly known as:  NEURONTIN TAKE  1 TABLET BY MOUTH 4 TIMES DAILY   HYDROcodone-acetaminophen 10-325 MG tablet Commonly known as:  NORCO Take 1 tablet by mouth every 6 (six) hours as needed.   insulin aspart 100 UNIT/ML injection Commonly known as:  novoLOG Inject into skin per Sliding scale three times a day at mealtimes   insulin aspart 100 UNIT/ML FlexPen Commonly known as:  NOVOLOG FLEXPEN USE AS DIRECTED PER SLIDING SCALE 100-150-5 UNITS, 151-200-7 UNITS, 201- 250-9 UNITS, 251-300-11 UNITS, 301-350- 13 UNITS, 351-400-15 UNITS   Insulin Glargine 100 UNIT/ML Solostar Pen Commonly known as:  LANTUS SOLOSTAR INJECT 65 UNITS SUBCUTANEOUSLY TWICE DAILY   Insulin Pen Needle 31G X 8 MM Misc Commonly known as:  B-D ULTRAFINE III SHORT PEN USE ONE EACH 5 TIMES PER DAY WITH INSULIN.   ketoconazole 2 % cream Commonly known as:  NIZORAL Apply 1 application topically daily.   levocetirizine 5 MG tablet Commonly known as:  [name] Take 1 tablet (5 mg total) by mouth every evening.   montelukast 10 MG tablet Commonly known as:  SINGULAIR Take 1 tablet (10 mg total) by mouth at bedtime.   niacin 500 MG tablet Take 500 mg by mouth at bedtime.   omeprazole 20 MG capsule Commonly known as:  PRILOSEC Take 1 capsule (20 mg total) by mouth 2 (two) times daily before a meal.   polyethylene glycol powder powder Commonly known as:  GLYCOLAX/MIRALAX MIX 17GM (= 1 CAPFUL) IN LIQUID AND TAKEONE TIME DAILY AS NEEDED   polyethylene glycol powder powder Commonly known as:  GLYCOLAX/MIRALAX Take 17 g by mouth daily as needed for moderate constipation.   predniSONE 5 MG tablet Commonly known as:  DELTASONE   pregabalin 50 MG capsule Commonly known as:  LYRICA Take 1 capsule (50 mg total) by mouth 3 (three) times daily.   senna 8.6 MG tablet Commonly known as:  SENOKOT Take 1 tablet by mouth daily.   sertraline 100 MG tablet Commonly known as:  ZOLOFT TAKE 1 TABLET BY MOUTH ONCE DAILY   simvastatin 40 MG  tablet Commonly known as:  ZOCOR Take 0.5 tablets (20 mg total) by mouth daily at 6 PM.   tamsulosin 0.4 MG Caps capsule Commonly known as:  FLOMAX Take 1 capsule (0.4 mg total) by mouth daily.   ULORIC 80 MG Tabs Generic drug:  Febuxostat Take 1 tablet by mouth every other day.   vitamin B-12 500 MCG tablet Commonly known as:  CYANOCOBALAMIN Take 1,000 mcg  by mouth daily.   vitamin C 500 MG tablet Commonly known as:  ASCORBIC ACID Take 500 mg by mouth daily.   Vitamin D-3 5000 units Tabs Take by mouth 2 (two) times daily.   VOLTAREN 1 % Gel Generic drug:  diclofenac sodium APPLY 2 GRAMS TO AFFECTED AREA(S) 4 TIMES DAILY       Allergies:  Allergies  Allergen Reactions  . Levaquin [Levofloxacin In D5w] Other (See Comments)    Kidney shut down  . Milk-Related Compounds Nausea And Vomiting  . Septra Ds [Sulfamethoxazole-Trimethoprim]     Family History: Family History  Problem Relation Age of Onset  . Pancreatic cancer Father   . Cancer Father   . Stroke Father   . Heart disease Mother   . Diabetes Mother   . Depression Mother   . Kidney disease Neg Hx   . Prostate cancer Neg Hx     Social History:  reports that he has been smoking cigarettes.  He has a 12.50 pack-year smoking history. he has never used smokeless tobacco. He reports that he does not drink alcohol or use drugs.  ROS:                                        Physical Exam: There were no vitals taken for this visit.  Constitutional: Well nourished. Alert and oriented, No acute distress. HEENT: [place] AT, moist mucus membranes. Trachea midline, no masses. Cardiovascular: No clubbing, cyanosis, or edema. Respiratory: Normal respiratory effort, no increased work of breathing. GI: Abdomen is soft, non tender, non distended, no abdominal masses. Liver and spleen not palpable.  No hernias appreciated.  Stool sample for occult testing is not indicated.   GU: No CVA tenderness.   No bladder fullness or masses.  Patient with circumcised phallus. Urethral meatus is patent.  No penile discharge. No penile lesions or rashes. Scrotum without lesions, cysts, rashes and/or edema.  Testicles are located scrotally bilaterally. No masses are appreciated in the testicles. Left and right epididymis are normal. Rectal: Patient with  normal sphincter tone. Anus and perineum without scarring or rashes. No rectal masses are appreciated. Prostate is approximately 45 grams, irregular, no nodules are appreciated. Seminal vesicles are normal. Skin: No rashes, bruises or suspicious lesions. Lymph: No cervical or inguinal adenopathy. Neurologic: Grossly intact, no focal deficits, moving all 4 extremities. Psychiatric: Normal mood and affect.  Laboratory Data: ***   Pertinent imaging ***  Assessment & Plan:    1. BPH with LUTS  - IPSS score is 18/4, it is improving  - Continue conservative management, avoiding bladder irritants and timed voiding's (stop diet drinks and reduce coffee)  - Sleep study in the future  - Continue tamsulosin 0.4 mg daily  - RTC in 12 months for IPSS PSA and PVR   No Follow-up on file.  These notes generated with voice recognition software. I apologize for typographical errors.  Zara Council, [facility] [address] [place], [place] [address] [phone]

## 2017-05-19 ENCOUNTER — Ambulatory Visit: Payer: Medicare Other | Admitting: Urology

## 2017-05-21 ENCOUNTER — Encounter (INDEPENDENT_AMBULATORY_CARE_PROVIDER_SITE_OTHER): Payer: Medicare Other

## 2017-05-21 ENCOUNTER — Ambulatory Visit (INDEPENDENT_AMBULATORY_CARE_PROVIDER_SITE_OTHER): Payer: Medicare Other | Admitting: Vascular Surgery

## 2017-06-23 ENCOUNTER — Other Ambulatory Visit: Payer: Self-pay

## 2017-06-23 NOTE — Telephone Encounter (Signed)
Refill request came over for the patient that was denied, because the patient no longer a patient here.

## 2017-07-16 ENCOUNTER — Other Ambulatory Visit: Payer: Self-pay | Admitting: Hematology

## 2017-07-16 DIAGNOSIS — R918 Other nonspecific abnormal finding of lung field: Secondary | ICD-10-CM

## 2018-01-15 ENCOUNTER — Encounter: Payer: Self-pay | Admitting: Family Medicine

## 2018-10-19 ENCOUNTER — Other Ambulatory Visit: Payer: Self-pay | Admitting: Family Medicine

## 2018-10-19 DIAGNOSIS — J441 Chronic obstructive pulmonary disease with (acute) exacerbation: Secondary | ICD-10-CM

## 2022-08-14 DEATH — deceased
